# Patient Record
Sex: Female | Born: 1960 | Race: White | Hispanic: No | Marital: Single | Smoking: Former smoker
Health system: Southern US, Community
[De-identification: ages and names within clinical notes are randomized; demographics above are authoritative.]

## PROBLEM LIST (undated history)

## (undated) DIAGNOSIS — G5760 Lesion of plantar nerve, unspecified lower limb: Secondary | ICD-10-CM

## (undated) DIAGNOSIS — Z860100 Personal history of colon polyps, unspecified: Secondary | ICD-10-CM

## (undated) DIAGNOSIS — G43909 Migraine, unspecified, not intractable, without status migrainosus: Secondary | ICD-10-CM

## (undated) DIAGNOSIS — N200 Calculus of kidney: Secondary | ICD-10-CM

## (undated) DIAGNOSIS — I2699 Other pulmonary embolism without acute cor pulmonale: Secondary | ICD-10-CM

## (undated) DIAGNOSIS — E119 Type 2 diabetes mellitus without complications: Secondary | ICD-10-CM

## (undated) DIAGNOSIS — Z86718 Personal history of other venous thrombosis and embolism: Secondary | ICD-10-CM

## (undated) DIAGNOSIS — I429 Cardiomyopathy, unspecified: Secondary | ICD-10-CM

## (undated) DIAGNOSIS — E559 Vitamin D deficiency, unspecified: Secondary | ICD-10-CM

## (undated) DIAGNOSIS — Z8719 Personal history of other diseases of the digestive system: Secondary | ICD-10-CM

## (undated) DIAGNOSIS — I10 Essential (primary) hypertension: Secondary | ICD-10-CM

## (undated) HISTORY — DX: Personal history of colon polyps, unspecified: Z86.0100

## (undated) HISTORY — PX: UPPER GASTROINTESTINAL ENDOSCOPY: SHX188

## (undated) HISTORY — PX: LIPOSUCTION: SHX10

## (undated) HISTORY — DX: Lesion of plantar nerve, unspecified lower limb: G57.60

## (undated) HISTORY — PX: LITHOTRIPSY: SUR834

## (undated) HISTORY — DX: Type 2 diabetes mellitus without complications: E11.9

## (undated) HISTORY — PX: CHOLECYSTECTOMY: SHX55

## (undated) HISTORY — DX: Personal history of other venous thrombosis and embolism: Z86.718

## (undated) HISTORY — DX: Essential (primary) hypertension: I10

## (undated) HISTORY — DX: Personal history of colonic polyps: Z86.010

## (undated) HISTORY — DX: Personal history of other diseases of the digestive system: Z87.19

## (undated) HISTORY — DX: Vitamin D deficiency, unspecified: E55.9

## (undated) HISTORY — PX: COLONOSCOPY: SHX174

## (undated) HISTORY — PX: ABDOMINAL HYSTERECTOMY: SHX81

---
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
## 2008-03-20 ENCOUNTER — Emergency Department (HOSPITAL_COMMUNITY): Admission: EM | Admit: 2008-03-20 | Discharge: 2008-03-20 | Payer: Self-pay | Admitting: Emergency Medicine

## 2008-11-27 ENCOUNTER — Encounter: Admission: RE | Admit: 2008-11-27 | Discharge: 2008-11-27 | Payer: Self-pay | Admitting: Allergy and Immunology

## 2010-03-23 ENCOUNTER — Emergency Department (HOSPITAL_COMMUNITY): Admission: EM | Admit: 2010-03-23 | Discharge: 2010-03-23 | Payer: Self-pay | Admitting: Emergency Medicine

## 2010-03-24 ENCOUNTER — Ambulatory Visit: Payer: Self-pay | Admitting: Vascular Surgery

## 2010-03-24 ENCOUNTER — Encounter (INDEPENDENT_AMBULATORY_CARE_PROVIDER_SITE_OTHER): Payer: Self-pay | Admitting: Emergency Medicine

## 2010-03-24 ENCOUNTER — Ambulatory Visit: Admission: RE | Admit: 2010-03-24 | Discharge: 2010-03-24 | Payer: Self-pay | Admitting: Emergency Medicine

## 2010-11-23 ENCOUNTER — Other Ambulatory Visit (HOSPITAL_COMMUNITY)
Admission: RE | Admit: 2010-11-23 | Discharge: 2010-11-23 | Disposition: A | Discharge status: Home / Self Care | Payer: Self-pay | Source: Ambulatory Visit | Attending: Internal Medicine | Admitting: Internal Medicine

## 2010-11-23 DIAGNOSIS — Z01419 Encounter for gynecological examination (general) (routine) without abnormal findings: Secondary | ICD-10-CM | POA: Insufficient documentation

## 2010-12-28 LAB — COMPREHENSIVE METABOLIC PANEL
ALT: 22 U/L (ref 0–35)
AST: 22 U/L (ref 0–37)
Albumin: 4.2 g/dL (ref 3.5–5.2)
BUN: 10 mg/dL (ref 6–23)
CO2: 29 mEq/L (ref 19–32)
Calcium: 9.1 mg/dL (ref 8.4–10.5)
Chloride: 110 mEq/L (ref 96–112)
GFR calc non Af Amer: >60 mL/min (ref >60)
Glucose, Bld: 99 mg/dL (ref 70–99)
Potassium: 3.8 mEq/L (ref 3.5–5.1)

## 2010-12-28 LAB — CBC
HCT: 39.1 % (ref 36.0–46.0)
MCV: 83.7 fL (ref 78.0–100.0)
Platelets: 277 10*3/uL (ref 150–400)
RBC: 4.68 MIL/uL (ref 3.87–5.11)
RDW: 12.6 % (ref 11.5–15.5)

## 2010-12-28 LAB — DIFFERENTIAL
Basophils Relative: 1 % (ref 0–1)
Eosinophils Absolute: 0.3 10*3/uL (ref 0.0–0.7)
Lymphocytes Relative: 24 % (ref 12–46)
Monocytes Absolute: 0.3 10*3/uL (ref 0.1–1.0)
Neutro Abs: 5.3 10*3/uL (ref 1.7–7.7)

## 2010-12-28 LAB — POCT CARDIAC MARKERS
CKMB, poc: <1 ng/mL — ABNORMAL LOW (ref 1.0–8.0)
Myoglobin, poc: 60 ng/mL (ref 12–200)
Troponin i, poc: <0.05 ng/mL (ref 0.00–0.09)

## 2011-03-01 ENCOUNTER — Other Ambulatory Visit (HOSPITAL_COMMUNITY)
Admission: RE | Admit: 2011-03-01 | Discharge: 2011-03-01 | Disposition: A | Discharge status: Home / Self Care | Payer: 59 | Source: Ambulatory Visit | Attending: Internal Medicine | Admitting: Internal Medicine

## 2011-03-01 DIAGNOSIS — Z01419 Encounter for gynecological examination (general) (routine) without abnormal findings: Secondary | ICD-10-CM | POA: Insufficient documentation

## 2011-12-30 ENCOUNTER — Other Ambulatory Visit: Payer: Self-pay | Admitting: Family Medicine

## 2011-12-30 DIAGNOSIS — R1011 Right upper quadrant pain: Secondary | ICD-10-CM

## 2011-12-31 ENCOUNTER — Other Ambulatory Visit: Payer: Self-pay | Admitting: Family Medicine

## 2011-12-31 DIAGNOSIS — N63 Unspecified lump in unspecified breast: Secondary | ICD-10-CM

## 2012-01-03 ENCOUNTER — Ambulatory Visit
Admission: RE | Admit: 2012-01-03 | Discharge: 2012-01-03 | Disposition: A | Discharge status: Home / Self Care | Payer: Managed Care, Other (non HMO) | Source: Ambulatory Visit | Attending: Family Medicine | Admitting: Family Medicine

## 2012-01-03 DIAGNOSIS — R1011 Right upper quadrant pain: Secondary | ICD-10-CM

## 2012-01-10 ENCOUNTER — Ambulatory Visit
Admission: RE | Admit: 2012-01-10 | Discharge: 2012-01-10 | Disposition: A | Discharge status: Home / Self Care | Payer: Managed Care, Other (non HMO) | Source: Ambulatory Visit | Attending: Family Medicine | Admitting: Family Medicine

## 2012-01-10 DIAGNOSIS — N63 Unspecified lump in unspecified breast: Secondary | ICD-10-CM

## 2012-09-01 ENCOUNTER — Other Ambulatory Visit (HOSPITAL_COMMUNITY): Payer: Self-pay | Admitting: Gastroenterology

## 2012-09-01 DIAGNOSIS — R1011 Right upper quadrant pain: Secondary | ICD-10-CM

## 2012-09-06 ENCOUNTER — Ambulatory Visit (HOSPITAL_COMMUNITY): Payer: Managed Care, Other (non HMO)

## 2012-09-06 ENCOUNTER — Other Ambulatory Visit (HOSPITAL_COMMUNITY): Payer: Self-pay | Admitting: Gastroenterology

## 2012-09-06 ENCOUNTER — Ambulatory Visit (HOSPITAL_COMMUNITY)
Admission: RE | Admit: 2012-09-06 | Discharge: 2012-09-06 | Disposition: A | Discharge status: Home / Self Care | Payer: Managed Care, Other (non HMO) | Source: Ambulatory Visit | Attending: Gastroenterology | Admitting: Gastroenterology

## 2012-09-06 DIAGNOSIS — R1011 Right upper quadrant pain: Secondary | ICD-10-CM

## 2012-09-06 DIAGNOSIS — K5900 Constipation, unspecified: Secondary | ICD-10-CM | POA: Insufficient documentation

## 2012-09-20 ENCOUNTER — Other Ambulatory Visit: Payer: Self-pay | Admitting: Family Medicine

## 2012-09-20 DIAGNOSIS — R109 Unspecified abdominal pain: Secondary | ICD-10-CM

## 2012-09-20 DIAGNOSIS — G8929 Other chronic pain: Secondary | ICD-10-CM

## 2012-09-22 ENCOUNTER — Ambulatory Visit
Admission: RE | Admit: 2012-09-22 | Discharge: 2012-09-22 | Disposition: A | Discharge status: Home / Self Care | Payer: Managed Care, Other (non HMO) | Source: Ambulatory Visit | Attending: Family Medicine | Admitting: Family Medicine

## 2012-09-22 DIAGNOSIS — G8929 Other chronic pain: Secondary | ICD-10-CM

## 2012-11-24 ENCOUNTER — Ambulatory Visit (INDEPENDENT_AMBULATORY_CARE_PROVIDER_SITE_OTHER): Payer: 59 | Admitting: General Surgery

## 2013-02-06 DIAGNOSIS — N200 Calculus of kidney: Secondary | ICD-10-CM | POA: Insufficient documentation

## 2013-02-06 DIAGNOSIS — D72829 Elevated white blood cell count, unspecified: Secondary | ICD-10-CM | POA: Insufficient documentation

## 2013-02-06 DIAGNOSIS — R109 Unspecified abdominal pain: Secondary | ICD-10-CM | POA: Insufficient documentation

## 2013-03-01 ENCOUNTER — Emergency Department (HOSPITAL_COMMUNITY)
Admission: EM | Admit: 2013-03-01 | Discharge: 2013-03-01 | Disposition: A | Discharge status: Home / Self Care | Payer: 59 | Attending: Emergency Medicine | Admitting: Emergency Medicine

## 2013-03-01 ENCOUNTER — Encounter (HOSPITAL_COMMUNITY): Payer: Self-pay | Admitting: *Deleted

## 2013-03-01 ENCOUNTER — Emergency Department (HOSPITAL_COMMUNITY): Payer: 59

## 2013-03-01 DIAGNOSIS — R42 Dizziness and giddiness: Secondary | ICD-10-CM | POA: Insufficient documentation

## 2013-03-01 DIAGNOSIS — R6883 Chills (without fever): Secondary | ICD-10-CM | POA: Insufficient documentation

## 2013-03-01 DIAGNOSIS — R109 Unspecified abdominal pain: Secondary | ICD-10-CM | POA: Insufficient documentation

## 2013-03-01 DIAGNOSIS — N200 Calculus of kidney: Secondary | ICD-10-CM | POA: Insufficient documentation

## 2013-03-01 HISTORY — DX: Calculus of kidney: N20.0

## 2013-03-01 LAB — COMPREHENSIVE METABOLIC PANEL
Alkaline Phosphatase: 66 U/L (ref 39–117)
CO2: 24 mEq/L (ref 19–32)
Chloride: 99 mEq/L (ref 96–112)
Creatinine, Ser: 0.71 mg/dL (ref 0.50–1.10)
GFR calc Af Amer: >90 mL/min (ref >90)
GFR calc non Af Amer: >90 mL/min (ref >90)
Glucose, Bld: 109 mg/dL — ABNORMAL HIGH (ref 70–99)
Sodium: 137 mEq/L (ref 135–145)
Total Bilirubin: 0.6 mg/dL (ref 0.3–1.2)
Total Protein: 8.2 g/dL (ref 6.0–8.3)

## 2013-03-01 LAB — CBC WITH DIFFERENTIAL/PLATELET
Basophils Absolute: 0 10*3/uL (ref 0.0–0.1)
Basophils Relative: 0 % (ref 0–1)
Eosinophils Relative: 2 % (ref 0–5)
Hemoglobin: 13.9 g/dL (ref 12.0–15.0)
Lymphocytes Relative: 17 % (ref 12–46)
MCH: 29.1 pg (ref 26.0–34.0)
MCHC: 34.8 g/dL (ref 30.0–36.0)
MCV: 83.5 fL (ref 78.0–100.0)
Monocytes Absolute: 0.6 10*3/uL (ref 0.1–1.0)
Monocytes Relative: 8 % (ref 3–12)
RBC: 4.78 MIL/uL (ref 3.87–5.11)
RDW: 12.5 % (ref 11.5–15.5)

## 2013-03-01 LAB — URINALYSIS, ROUTINE W REFLEX MICROSCOPIC
Glucose, UA: NEGATIVE mg/dL
Ketones, ur: NEGATIVE mg/dL
Leukocytes, UA: NEGATIVE
Protein, ur: NEGATIVE mg/dL
Specific Gravity, Urine: 1.025 (ref 1.005–1.030)

## 2013-03-01 LAB — URINE MICROSCOPIC-ADD ON

## 2013-03-01 MED ORDER — SODIUM CHLORIDE 0.9 % IV BOLUS (SEPSIS)
500.0000 mL | Freq: Once | INTRAVENOUS | Status: AC
  Administered 2013-03-01: 500 mL via INTRAVENOUS

## 2013-03-01 MED ORDER — MORPHINE SULFATE 4 MG/ML IJ SOLN
4.0000 mg | Freq: Once | INTRAMUSCULAR | Status: AC
  Administered 2013-03-01: 4 mg via INTRAVENOUS
  Filled 2013-03-01: qty 1

## 2013-03-01 MED ORDER — ONDANSETRON HCL 4 MG/2ML IJ SOLN
4.0000 mg | Freq: Once | INTRAMUSCULAR | Status: AC
  Administered 2013-03-01: 4 mg via INTRAVENOUS
  Filled 2013-03-01: qty 2

## 2013-03-01 NOTE — ED Notes (Signed)
Pt c/o upper abd pain, bilateral flank pain w/increase pain to her RUQ radiating into her back and up into her Right shoulder blade and nausea x2 weeks, pt experiencing increase pain associated w/SOB, chills, and dizziness starting this am. Pt denies burning w/urintaion or abnormal vaginal odor/discharge. Pt has a hx of kidney stones, states her symptoms feel the same, pt had a stent placed two weeks ago which was removed one week ago, pt had lithotripsy procedure done as well one week ago.

## 2013-03-01 NOTE — ED Notes (Signed)
Pt has hx of kidney stones.  Had lithotripsy and stent placement x 2 weeks ago.  States that she is having bilateral flank and abdominal pain with nausea today.  Reports it feels the same as her kidney stones.  Denies blood in urine

## 2013-03-01 NOTE — ED Provider Notes (Signed)
History     CSN: [id number]  Arrival date & time 03/01/13  1453   First MD Initiated Contact with Patient 03/01/13 1527      Chief Complaint  Patient presents with  . Nephrolithiasis     Patient is a 52 y.o. female presenting with flank pain. The history is provided by the patient.  Flank Pain This is a recurrent problem. Episode onset: earlier today. The problem occurs constantly. The problem has been gradually worsening. Pertinent negatives include no chest pain, no abdominal pain and no headaches. Exacerbated by: palpation. Nothing relieves the symptoms. She has tried rest for the symptoms. The treatment provided mild relief.  pt reports bilateral flank pain She reports nausea and chills No recorded fever.  No cp. No weakness No abd pain No vag bleeding No dysuria  Pt reports recent ureteral stent placement in [place] about 3 weeks ago, and then had lithotripsy and stent removal last week by her urologist in [facility] She now reports recurrence of her pain   Past Medical History  Diagnosis Date  . Kidney stones     Past Surgical History  Procedure Laterality Date  . Lithotripsy      History reviewed. No pertinent family history.  History  Substance Use Topics  . Smoking status: Never Smoker   . Smokeless tobacco: Not on file  . Alcohol Use: No    OB History   Grav Para Term Preterm Abortions TAB SAB Ect Mult Living                  Review of Systems  Constitutional: Positive for chills. Negative for fever.  HENT: Negative for neck pain.   Cardiovascular: Negative for chest pain.  Gastrointestinal: Negative for abdominal pain.  Genitourinary: Positive for flank pain.  Neurological: Positive for dizziness. Negative for weakness and headaches.  All other systems reviewed and are negative.    Allergies  Contrast media  Home Medications   Current Outpatient Rx  Name  Route  Sig  Dispense  Refill  . aspirin EC 81 MG tablet   Oral   Take 81 mg  by mouth daily.         . cetirizine (ZYRTEC) 10 MG tablet   Oral   Take 10 mg by mouth daily.         . cholecalciferol (VITAMIN D) 1000 UNITS tablet   Oral   Take 2,000 Units by mouth daily.         . furosemide (LASIX) 40 MG tablet   Oral   Take 40 mg by mouth daily.         . metoCLOPramide (REGLAN) 10 MG tablet   Oral   Take 10 mg by mouth 4 (four) times daily.         Marland Kitchen omeprazole (PRILOSEC) 20 MG capsule   Oral   Take 20 mg by mouth daily.         . ondansetron (ZOFRAN) 4 MG tablet   Oral   Take 4 mg by mouth every 8 (eight) hours as needed for nausea.         Marland Kitchen oxyCODONE-acetaminophen (PERCOCET/ROXICET) 5-325 MG per tablet   Oral   Take 1 tablet by mouth every 4 (four) hours as needed for pain.           BP 136/76  Pulse 89  Temp(Src) 98.4 F (36.9 C) (Oral)  Resp 18  SpO2 99%  Physical Exam CONSTITUTIONAL: Well developed/well nourished HEAD: Normocephalic/atraumatic  EYES: EOMI/PERRL ENMT: Mucous membranes moist NECK: supple no meningeal signs SPINE:entire spine nontender, No bruising/crepitance/stepoffs noted to spine CV: S1/S2 noted, no murmurs/rubs/gallops noted LUNGS: Lungs are clear to auscultation bilaterally, no apparent distress ABDOMEN: soft, nontender, no rebound or guarding ZO:[id number] cva tenderness NEURO: Pt is awake/alert, moves all extremitiesx4 EXTREMITIES: pulses normal, full ROM SKIN: warm, color normal PSYCH: no abnormalities of mood noted  ED Course  Procedures Labs Reviewed  URINALYSIS, ROUTINE W REFLEX MICROSCOPIC - Abnormal; Notable for the following:    Hgb urine dipstick SMALL (*)    All other components within normal limits  URINE MICROSCOPIC-ADD ON - Abnormal; Notable for the following:    Squamous Epithelial / LPF MANY (*)    All other components within normal limits  CBC WITH DIFFERENTIAL  COMPREHENSIVE METABOLIC PANEL   9:81 PM On my assessment pt reports only flank pain, no RUQ tenderness or  upper back tenderness reported (nurse documented RUQ pain) She reports similar to prior kidney stone She did report dizziness but no syncope - EKG unremarkable She has had multiple Ct scans recently.  Will start with renal US to evaluate for hydro then reassess 6:33 PM Pt improved No hydronephrosis Labs are reassuring Discussed need for f/u with her urologist Pt agreeable  MDM  Nursing notes including past medical history and social history reviewed and considered in documentation Labs/vital reviewed and considered        Date: 03/01/2013  Rate: 92  Rhythm: normal sinus rhythm  QRS Axis: normal  Intervals: normal  ST/T Wave abnormalities: normal  Conduction Disutrbances:none  Narrative Interpretation:   Old EKG Reviewed: unchanged    Joya Gaskins, MD 03/01/13 [phone]

## 2013-03-01 NOTE — ED Notes (Signed)
Pt dc'd home w/all belongings, alert and ambulatory upon dc, no new rx prescribed, pt verbalizes understanding of dc instructions, driven home by daughter

## 2013-05-28 ENCOUNTER — Emergency Department (HOSPITAL_COMMUNITY)
Admission: EM | Admit: 2013-05-28 | Discharge: 2013-05-28 | Disposition: A | Discharge status: Home / Self Care | Payer: Worker's Compensation | Attending: Emergency Medicine | Admitting: Emergency Medicine

## 2013-05-28 ENCOUNTER — Encounter (HOSPITAL_COMMUNITY): Payer: Self-pay | Admitting: *Deleted

## 2013-05-28 ENCOUNTER — Emergency Department (HOSPITAL_COMMUNITY): Payer: Worker's Compensation

## 2013-05-28 DIAGNOSIS — R0789 Other chest pain: Secondary | ICD-10-CM

## 2013-05-28 DIAGNOSIS — Z86711 Personal history of pulmonary embolism: Secondary | ICD-10-CM | POA: Insufficient documentation

## 2013-05-28 DIAGNOSIS — R0602 Shortness of breath: Secondary | ICD-10-CM | POA: Insufficient documentation

## 2013-05-28 DIAGNOSIS — R071 Chest pain on breathing: Secondary | ICD-10-CM | POA: Insufficient documentation

## 2013-05-28 DIAGNOSIS — Z79899 Other long term (current) drug therapy: Secondary | ICD-10-CM | POA: Insufficient documentation

## 2013-05-28 DIAGNOSIS — Z87442 Personal history of urinary calculi: Secondary | ICD-10-CM | POA: Insufficient documentation

## 2013-05-28 HISTORY — DX: Other pulmonary embolism without acute cor pulmonale: I26.99

## 2013-05-28 MED ORDER — ONDANSETRON HCL 4 MG/2ML IJ SOLN: 4.0000 mg | Freq: Once | INTRAMUSCULAR | Status: AC

## 2013-05-28 MED ORDER — MORPHINE SULFATE 4 MG/ML IJ SOLN
4.0000 mg | Freq: Once | INTRAMUSCULAR | Status: AC
  Administered 2013-05-28: 4 mg via INTRAVENOUS
  Filled 2013-05-28: qty 1

## 2013-05-28 MED ORDER — OXYCODONE-ACETAMINOPHEN 5-325 MG PO TABS: 1.0000 | ORAL_TABLET | Freq: Four times a day (QID) | ORAL | Status: DC | PRN

## 2013-05-28 MED ORDER — ONDANSETRON HCL 4 MG/2ML IJ SOLN
INTRAMUSCULAR | Status: AC
  Administered 2013-05-28: 4 mg via INTRAVENOUS
  Filled 2013-05-28: qty 2

## 2013-05-28 NOTE — ED Notes (Signed)
Patient transported to X-ray 

## 2013-05-28 NOTE — ED Provider Notes (Signed)
CSN: [id number]     Arrival date & time 05/28/13  1229 History     First MD Initiated Contact with Patient 05/28/13 1246     Chief Complaint  Patient presents with  . Chest Pain  . Shortness of Breath   (Consider location/radiation/quality/duration/timing/severity/associated sxs/prior Treatment) Patient is a 52 y.o. female presenting with chest pain and shortness of breath. The history is provided by the patient.  Chest Pain Associated symptoms: shortness of breath   Associated symptoms: no abdominal pain, no back pain, no headache, no nausea, no numbness, not vomiting and no weakness   Shortness of Breath Associated symptoms: chest pain   Associated symptoms: no abdominal pain, no headaches, no rash and no vomiting    patient presents with sharp left-sided chest pain. She states while she was lifting a grill at work she felt a pop and had pain. It is worse with movement worse with deep breathing. No hemoptysis. No cough. No swelling or legs. She did have a previous history of pulmonary embolism, however she is not on anticoagulation. She does not smoke.  Past Medical History  Diagnosis Date  . Kidney stones   . Pulmonary embolism    Past Surgical History  Procedure Laterality Date  . Lithotripsy     No family history on file. History  Substance Use Topics  . Smoking status: Never Smoker   . Smokeless tobacco: Not on file  . Alcohol Use: No   OB History   Grav Para Term Preterm Abortions TAB SAB Ect Mult Living                 Review of Systems  Constitutional: Negative for activity change and appetite change.  HENT: Negative for neck stiffness.   Eyes: Negative for pain.  Respiratory: Positive for shortness of breath. Negative for chest tightness.   Cardiovascular: Positive for chest pain. Negative for leg swelling.  Gastrointestinal: Negative for nausea, vomiting, abdominal pain and diarrhea.  Genitourinary: Negative for flank pain.  Musculoskeletal: Negative for  back pain.  Skin: Negative for rash.  Neurological: Negative for weakness, numbness and headaches.  Psychiatric/Behavioral: Negative for behavioral problems.    Allergies  Contrast media  Home Medications   Current Outpatient Rx  Name  Route  Sig  Dispense  Refill  . furosemide (LASIX) 40 MG tablet   Oral   Take 40 mg by mouth daily.         Marland Kitchen ibuprofen (ADVIL,MOTRIN) 200 MG tablet   Oral   Take 800 mg by mouth every 6 (six) hours as needed for pain (back pain).         Marland Kitchen omeprazole (PRILOSEC) 20 MG capsule   Oral   Take 20 mg by mouth daily.         . ondansetron (ZOFRAN) 4 MG tablet   Oral   Take 4 mg by mouth every 8 (eight) hours as needed for nausea.         Marland Kitchen oxyCODONE-acetaminophen (PERCOCET/ROXICET) 5-325 MG per tablet   Oral   Take 1 tablet by mouth every 4 (four) hours as needed for pain.         Marland Kitchen oxyCODONE-acetaminophen (PERCOCET/ROXICET) 5-325 MG per tablet   Oral   Take 1-2 tablets by mouth every 6 (six) hours as needed for pain.   15 tablet   0    BP 126/67  Pulse 80  Temp(Src) 98.2 F (36.8 C) (Oral)  Resp 15  SpO2 98% Physical Exam  Nursing note and vitals reviewed. Constitutional: She is oriented to person, place, and time. She appears well-developed and well-nourished.  HENT:  Head: Normocephalic and atraumatic.  Eyes: EOM are normal. Pupils are equal, round, and reactive to light.  Neck: Normal range of motion. Neck supple.  Cardiovascular: Normal rate, regular rhythm and normal heart sounds.   No murmur heard. Pulmonary/Chest: Effort normal and breath sounds normal. No respiratory distress. She has no wheezes. She has no rales. She exhibits tenderness.  Tenderness to left anterior chest wall just lateral to sternum. No crepitance deformity. No subcutaneous emphysema.  Abdominal: Soft. Bowel sounds are normal. She exhibits no distension. There is no tenderness. There is no rebound and no guarding.  Musculoskeletal: Normal range  of motion.  Neurological: She is alert and oriented to person, place, and time. No cranial nerve deficit.  Skin: Skin is warm and dry.  Psychiatric: She has a normal mood and affect. Her speech is normal.    ED Course   Procedures (including critical care time)  Labs Reviewed - No data to display Dg Chest 2 View  05/28/2013   *RADIOLOGY REPORT*  Clinical Data: Chest pain shortness of breath.  CHEST - 2 VIEW  Comparison: None.  Findings: Low volume film without focal airspace consolidation, pulmonary edema, or pleural effusion. The cardiopericardial silhouette is within normal limits for size. Imaged bony structures of the thorax are intact.  IMPRESSION: No acute cardiopulmonary process.   Original Report Authenticated By: Kennith Center, M.D.   Dg Ribs Unilateral Left  05/28/2013   *RADIOLOGY REPORT*  Clinical Data: Chest pain with rib pain near left axilla.  LEFT RIBS - 2 VIEW  Comparison: No comparison studies available.  Findings: Two views of the left ribs obtained with a radiopaque BB localizing the region of patient concern.  No underlying displaced acute left rib fracture is evident.  IMPRESSION: No evidence for displaced acute left rib fracture.   Original Report Authenticated By: Kennith Center, M.D.   1. Chest wall pain     Date: 05/28/2013  Rate: 90  Rhythm: normal sinus rhythm  QRS Axis: normal  Intervals: normal  ST/T Wave abnormalities: normal  Conduction Disutrbances: none  Narrative Interpretation: unremarkable    MDM  Patient with reproducible left-sided chest pain. Began acutely while lifting something. Likely chest wall pain. X-rays negative for fracture. Doubt cardiac cause or pulmonary embolism. Will discharge home.  Juliet Rude. Rubin Payor, MD 05/28/13 [phone]

## 2013-05-28 NOTE — ED Notes (Signed)
Pt was moving a grill at work and heard a pop in her left breast, then had pain shoot to left arm and gave her numbness in left arm.  Pt has pain with moving left arm.  Reports sob and has history of throwing a blood clot post cath with same feeling.

## 2013-06-02 ENCOUNTER — Emergency Department (HOSPITAL_COMMUNITY)
Admission: EM | Admit: 2013-06-02 | Discharge: 2013-06-02 | Disposition: A | Discharge status: Home / Self Care | Payer: Worker's Compensation | Attending: Emergency Medicine | Admitting: Emergency Medicine

## 2013-06-02 ENCOUNTER — Encounter (HOSPITAL_COMMUNITY): Payer: Self-pay | Admitting: Emergency Medicine

## 2013-06-02 ENCOUNTER — Emergency Department (HOSPITAL_COMMUNITY): Payer: Worker's Compensation

## 2013-06-02 DIAGNOSIS — R0789 Other chest pain: Secondary | ICD-10-CM

## 2013-06-02 DIAGNOSIS — Z79899 Other long term (current) drug therapy: Secondary | ICD-10-CM | POA: Insufficient documentation

## 2013-06-02 DIAGNOSIS — R071 Chest pain on breathing: Secondary | ICD-10-CM | POA: Insufficient documentation

## 2013-06-02 DIAGNOSIS — Z87442 Personal history of urinary calculi: Secondary | ICD-10-CM | POA: Insufficient documentation

## 2013-06-02 DIAGNOSIS — Z86711 Personal history of pulmonary embolism: Secondary | ICD-10-CM | POA: Insufficient documentation

## 2013-06-02 MED ORDER — OXYCODONE-ACETAMINOPHEN 7.5-325 MG PO TABS: 1.0000 | ORAL_TABLET | ORAL | Status: DC | PRN

## 2013-06-02 MED ORDER — METHOCARBAMOL 500 MG PO TABS: 500.0000 mg | ORAL_TABLET | Freq: Two times a day (BID) | ORAL | Status: DC

## 2013-06-02 MED ORDER — IBUPROFEN 600 MG PO TABS: 600.0000 mg | ORAL_TABLET | Freq: Four times a day (QID) | ORAL | Status: DC | PRN

## 2013-06-02 NOTE — ED Provider Notes (Signed)
CSN: [id number]     Arrival date & time 06/02/13  1837 History     First MD Initiated Contact with Patient 06/02/13 1848     Chief Complaint  Patient presents with  . Chest Pain   (Consider location/radiation/quality/duration/timing/severity/associated sxs/prior Treatment) Patient is a 52 y.o. female presenting with chest pain. The history is provided by the patient.  Chest Pain  patient here complaining of left-sided chest pain x5 days after she was moving a growth. Seen here and had negative x-ray according to the old records. There is no signs of rib fracture pneumothorax. Continues to have sharp pain is worse with movement. Has been using Percocet without relief. She denies any recent current injury. No fever or cough. No anginal type chest pain. No medications used prior to arrival  Past Medical History  Diagnosis Date  . Kidney stones   . Pulmonary embolism    Past Surgical History  Procedure Laterality Date  . Lithotripsy    . Cholecystectomy    . Abdominal hysterectomy     No family history on file. History  Substance Use Topics  . Smoking status: Never Smoker   . Smokeless tobacco: Not on file  . Alcohol Use: Yes   OB History   Grav Para Term Preterm Abortions TAB SAB Ect Mult Living                 Review of Systems  Cardiovascular: Positive for chest pain.  All other systems reviewed and are negative.    Allergies  Contrast media  Home Medications   Current Outpatient Rx  Name  Route  Sig  Dispense  Refill  . furosemide (LASIX) 40 MG tablet   Oral   Take 40 mg by mouth daily.         Marland Kitchen ibuprofen (ADVIL,MOTRIN) 200 MG tablet   Oral   Take 800 mg by mouth every 6 (six) hours as needed for pain (back pain).         Marland Kitchen omeprazole (PRILOSEC) 20 MG capsule   Oral   Take 20 mg by mouth daily.         . ondansetron (ZOFRAN) 4 MG tablet   Oral   Take 4 mg by mouth every 8 (eight) hours as needed for nausea.         Marland Kitchen oxyCODONE-acetaminophen  (PERCOCET/ROXICET) 5-325 MG per tablet   Oral   Take 1 tablet by mouth every 4 (four) hours as needed for pain.         Marland Kitchen oxyCODONE-acetaminophen (PERCOCET/ROXICET) 5-325 MG per tablet   Oral   Take 1-2 tablets by mouth every 6 (six) hours as needed for pain.   15 tablet   0    BP 128/72  Pulse 96  Temp(Src) 98.1 F (36.7 C)  Resp 18  SpO2 97% Physical Exam  Nursing note and vitals reviewed. Constitutional: She is oriented to person, place, and time. She appears well-developed and well-nourished.  Non-toxic appearance. No distress.  HENT:  Head: Normocephalic and atraumatic.  Eyes: Conjunctivae, EOM and lids are normal. Pupils are equal, round, and reactive to light.  Neck: Normal range of motion. Neck supple. No tracheal deviation present. No mass present.  Cardiovascular: Normal rate, regular rhythm and normal heart sounds.  Exam reveals no gallop.   No murmur heard. Pulmonary/Chest: Effort normal and breath sounds normal. No stridor. No respiratory distress. She has no decreased breath sounds. She has no wheezes. She has no rhonchi. She has  no rales.    Abdominal: Soft. Normal appearance and bowel sounds are normal. She exhibits no distension. There is no tenderness. There is no rebound and no CVA tenderness.  Musculoskeletal: Normal range of motion. She exhibits no edema and no tenderness.  Neurological: She is alert and oriented to person, place, and time. She has normal strength. No cranial nerve deficit or sensory deficit. GCS eye subscore is 4. GCS verbal subscore is 5. GCS motor subscore is 6.  Skin: Skin is warm and dry. No abrasion and no rash noted.  Psychiatric: She has a normal mood and affect. Her speech is normal and behavior is normal.    ED Course   Procedures (including critical care time)  Labs Reviewed - No data to display No results found. No diagnosis found.  MDM     Rate: 98   Rhythm: normal sinus rhythm  QRS Axis: normal  Intervals: normal   ST/T Wave abnormalities: normal  Conduction Disutrbances:none  Narrative Interpretation:   Old EKG Reviewed: none available  Chest x-rays pending of the likely to be negative. Anticipate discharge  Toy Baker, MD 06/02/13 [phone]

## 2013-06-02 NOTE — ED Notes (Signed)
Onset 5 days ago moved a grill heard a pop in chest seen in ED x-ray negative given pain medication. States pain continued with chest pain and increased shortness of breath.  Pain currently 7/10 achy sore.  Airway intact bilateral equal chest rise and fall.

## 2013-06-02 NOTE — ED Provider Notes (Signed)
Chest x-ray, reviewed.  Patient informed of the new finding of bilateral lower lobe, atelectasis.  She's been encouraged to take deep breaths.  A regular basis throughout the day  Arman Filter, NP 06/02/13 2113

## 2013-06-03 NOTE — ED Provider Notes (Signed)
Medical screening examination/treatment/procedure(s) were performed by non-physician practitioner and as supervising physician I was immediately available for consultation/collaboration.  Jazzalynn Rhudy T Arnetta Odeh, MD 06/03/13 1756 

## 2013-12-10 ENCOUNTER — Ambulatory Visit
Admission: RE | Admit: 2013-12-10 | Discharge: 2013-12-10 | Disposition: A | Discharge status: Home / Self Care | Payer: 59 | Source: Ambulatory Visit | Attending: Family Medicine | Admitting: Family Medicine

## 2013-12-10 ENCOUNTER — Other Ambulatory Visit: Payer: Self-pay | Admitting: Family Medicine

## 2013-12-10 DIAGNOSIS — R0989 Other specified symptoms and signs involving the circulatory and respiratory systems: Secondary | ICD-10-CM

## 2013-12-10 DIAGNOSIS — R059 Cough, unspecified: Secondary | ICD-10-CM

## 2013-12-10 DIAGNOSIS — R05 Cough: Secondary | ICD-10-CM

## 2014-04-10 HISTORY — PX: CARDIAC SURGERY: SHX584

## 2014-09-25 ENCOUNTER — Emergency Department (HOSPITAL_COMMUNITY)
Admission: EM | Admit: 2014-09-25 | Discharge: 2014-09-25 | Disposition: A | Discharge status: Home / Self Care | Payer: 59 | Attending: Emergency Medicine | Admitting: Emergency Medicine

## 2014-09-25 ENCOUNTER — Emergency Department (HOSPITAL_COMMUNITY): Payer: 59

## 2014-09-25 ENCOUNTER — Encounter (HOSPITAL_COMMUNITY): Payer: Self-pay | Admitting: Emergency Medicine

## 2014-09-25 DIAGNOSIS — Z79899 Other long term (current) drug therapy: Secondary | ICD-10-CM | POA: Diagnosis not present

## 2014-09-25 DIAGNOSIS — G43909 Migraine, unspecified, not intractable, without status migrainosus: Secondary | ICD-10-CM

## 2014-09-25 DIAGNOSIS — R519 Headache, unspecified: Secondary | ICD-10-CM

## 2014-09-25 DIAGNOSIS — Z86711 Personal history of pulmonary embolism: Secondary | ICD-10-CM | POA: Diagnosis not present

## 2014-09-25 DIAGNOSIS — R51 Headache: Secondary | ICD-10-CM | POA: Diagnosis present

## 2014-09-25 DIAGNOSIS — Z87442 Personal history of urinary calculi: Secondary | ICD-10-CM | POA: Insufficient documentation

## 2014-09-25 DIAGNOSIS — Z8679 Personal history of other diseases of the circulatory system: Secondary | ICD-10-CM | POA: Diagnosis not present

## 2014-09-25 HISTORY — DX: Migraine, unspecified, not intractable, without status migrainosus: G43.909

## 2014-09-25 HISTORY — DX: Cardiomyopathy, unspecified: I42.9

## 2014-09-25 LAB — BASIC METABOLIC PANEL
ANION GAP: 13 (ref 5–15)
BUN: 15 mg/dL (ref 6–23)
CHLORIDE: 100 meq/L (ref 96–112)
CO2: 27 meq/L (ref 19–32)
Calcium: 9.6 mg/dL (ref 8.4–10.5)
Creatinine, Ser: 0.66 mg/dL (ref 0.50–1.10)
GFR calc non Af Amer: >90 mL/min (ref >90)
Glucose, Bld: 97 mg/dL (ref 70–99)
POTASSIUM: 3.9 meq/L (ref 3.7–5.3)
Sodium: 140 mEq/L (ref 137–147)

## 2014-09-25 LAB — CBC WITH DIFFERENTIAL/PLATELET
BASOS ABS: 0 10*3/uL (ref 0.0–0.1)
BASOS PCT: 0 % (ref 0–1)
Eosinophils Absolute: 0.2 10*3/uL (ref 0.0–0.7)
Eosinophils Relative: 2 % (ref 0–5)
HCT: 42 % (ref 36.0–46.0)
HEMOGLOBIN: 14.6 g/dL (ref 12.0–15.0)
LYMPHS PCT: 18 % (ref 12–46)
Lymphs Abs: 1.8 10*3/uL (ref 0.7–4.0)
MCH: 28.9 pg (ref 26.0–34.0)
MCHC: 34.8 g/dL (ref 30.0–36.0)
MCV: 83 fL (ref 78.0–100.0)
MONOS PCT: 5 % (ref 3–12)
Monocytes Absolute: 0.5 10*3/uL (ref 0.1–1.0)
NEUTROS ABS: 7.8 10*3/uL — AB (ref 1.7–7.7)
NEUTROS PCT: 75 % (ref 43–77)
Platelets: 240 10*3/uL (ref 150–400)
RBC: 5.06 MIL/uL (ref 3.87–5.11)
RDW: 12.8 % (ref 11.5–15.5)
WBC: 10.3 10*3/uL (ref 4.0–10.5)

## 2014-09-25 MED ORDER — SODIUM CHLORIDE 0.9 % IV BOLUS (SEPSIS)
1000.0000 mL | Freq: Once | INTRAVENOUS | Status: AC
  Administered 2014-09-25: 1000 mL via INTRAVENOUS

## 2014-09-25 MED ORDER — METOCLOPRAMIDE HCL 5 MG/ML IJ SOLN
10.0000 mg | Freq: Once | INTRAMUSCULAR | Status: AC
  Administered 2014-09-25: 10 mg via INTRAVENOUS
  Filled 2014-09-25: qty 2

## 2014-09-25 MED ORDER — KETOROLAC TROMETHAMINE 30 MG/ML IJ SOLN
30.0000 mg | Freq: Once | INTRAMUSCULAR | Status: AC
  Administered 2014-09-25: 30 mg via INTRAVENOUS
  Filled 2014-09-25: qty 1

## 2014-09-25 MED ORDER — DIPHENHYDRAMINE HCL 50 MG/ML IJ SOLN
25.0000 mg | Freq: Once | INTRAMUSCULAR | Status: AC
  Administered 2014-09-25: 25 mg via INTRAVENOUS
  Filled 2014-09-25: qty 1

## 2014-09-25 NOTE — ED Notes (Signed)
Pt c/o left sided HA into neck x 12 days; pt with nausea and photophobia; pt sent here for further eval and hx of migraine in past

## 2014-09-25 NOTE — ED Provider Notes (Signed)
CSN: [id number]     Arrival date & time 09/25/14  1304 History   First MD Initiated Contact with Patient 09/25/14 1613     Chief Complaint  Patient presents with  . Headache     (Consider location/radiation/quality/duration/timing/severity/associated sxs/prior Treatment) HPI Comments: Patient presents today with a left sided headache.  She reports that the headache has been present since 09/14/14.  She reports that the pain has been fairly constant, but at times does improve temporarily.  She has been taking Excedrin Migraine without relief.  She reports that the headache is similar to previous headaches, but worse.  She was seen by her PCP just prior to arrival and sent to the ED to obtain a CT scan of her head.  She reports associated photosensitivity, nausea, and dizziness.  She denies vomiting, weakness, fever, chills, vision changes, neck pain/stiffness, numbness, or tingling.  She states that she [facility] in [facility] past, but not in the past 6 years.  Patient is a 53 y.o. female presenting with headaches. The history is provided by the patient.  Headache   Past Medical History  Diagnosis Date  . Kidney stones   . Pulmonary embolism   . Migraine   . Cardiomyopathy    Past Surgical History  Procedure Laterality Date  . Lithotripsy    . Cholecystectomy    . Abdominal hysterectomy     History reviewed. No pertinent family history. History  Substance Use Topics  . Smoking status: Never Smoker   . Smokeless tobacco: Not on file  . Alcohol Use: Yes   OB History    No data available     Review of Systems  Neurological: Positive for headaches.  All other systems reviewed and are negative.     Allergies  Contrast media and Fish allergy  Home Medications   Prior to Admission medications   Medication Sig Start Date End Date Taking? Authorizing Provider  furosemide (LASIX) 40 MG tablet Take 40 mg by mouth daily.    Historical Provider,  MD  ibuprofen (ADVIL,MOTRIN) 600 MG tablet Take 1 tablet (600 mg total) by mouth every 6 (six) hours as needed for pain. 06/02/13   Leota Jacobsen, MD  methocarbamol (ROBAXIN) 500 MG tablet Take 1 tablet (500 mg total) by mouth 2 (two) times daily. 06/02/13   Leota Jacobsen, MD  omeprazole (PRILOSEC) 20 MG capsule Take 20 mg by mouth daily.    Historical Provider, MD  ondansetron (ZOFRAN) 4 MG tablet Take 4 mg by mouth every 8 (eight) hours as needed for nausea.    Historical Provider, MD  oxyCODONE-acetaminophen (PERCOCET) 7.5-325 MG per tablet Take 1 tablet by mouth every 4 (four) hours as needed for pain. 06/02/13   Leota Jacobsen, MD  oxyCODONE-acetaminophen (PERCOCET/ROXICET) 5-325 MG per tablet Take 1 tablet by mouth every 6 (six) hours as needed for pain.    Historical Provider, MD   BP 134/74 mmHg  Pulse 80  Temp(Src) 97.9 F (36.6 C) (Oral)  Resp 16  Ht 5\' 5"  (1.651 m)  Wt 250 lb (113.399 kg)  BMI 41.60 kg/m2  SpO2 99% Physical Exam  Constitutional: She appears well-developed and well-nourished.  HENT:  Head: Normocephalic and atraumatic.  Mouth/Throat: Oropharynx is clear and moist.  Eyes: EOM are normal. Pupils are equal, round, and reactive to light.  Neck: Normal range of motion. Neck supple.  Cardiovascular: Normal rate, regular rhythm and normal heart sounds.   Pulmonary/Chest: Effort  normal and breath sounds normal.  Abdominal: Soft. Bowel sounds are normal.  Musculoskeletal: Normal range of motion.  Neurological: She is alert. She has normal strength. No cranial nerve deficit or sensory deficit. Coordination and gait normal.  Normal finger to nose testing Normal gait, no ataxia  Normal rapid alternating movements   Skin: Skin is warm and dry.  Psychiatric: She has a normal mood and affect.  Nursing note and vitals reviewed.   ED Course  Procedures (including critical care time) Labs Review Labs Reviewed  CBC WITH DIFFERENTIAL - Abnormal; Notable for the  following:    Neutro Abs 7.8 (*)    All other components within normal limits  BASIC METABOLIC PANEL    Imaging Review Ct Head Wo Contrast  09/25/2014   CLINICAL DATA:  Headaches x5 days  EXAM: CT HEAD WITHOUT CONTRAST  TECHNIQUE: Contiguous axial images were obtained from the base of the skull through the vertex without intravenous contrast.  COMPARISON:  03/20/2008  FINDINGS: No evidence of parenchymal hemorrhage or extra-axial fluid collection. No mass lesion, mass effect, or midline shift.  No CT evidence of acute infarction.  Mild small vessel ischemic changes.  Cerebral volume is within normal limits.  No ventriculomegaly.  The visualized paranasal sinuses are essentially clear. The mastoid air cells are unopacified.  No evidence of calvarial fracture.  IMPRESSION: No evidence of acute intracranial abnormality.  Mild small vessel ischemic changes.   Electronically Signed   By: Julian Hy M.D.   On: 09/25/2014 17:37     EKG Interpretation None     6:00 PM Patient reports improvement of headache at this time. MDM   Final diagnoses:  Headache   Pt HA treated and improved while in ED.  Presentation is like pts typical HA and non concerning for [facility]: Lambert Rhonda W, ICH, Meningitis, or temporal arteritis. Patient sent to the ED by PCP to obtain Head CT.  Head CT is negative for acute intracranial abnormality.  Pt is afebrile with no focal neuro deficits, nuchal rigidity, or change in vision. Pt given referral to Neurology outpatient.  Patient stable for discharge.  Pt verbalizes understanding and is agreeable with plan to dc. Return precautions given.      Hyman Bible, PA-C 09/25/14 [place], MD 09/25/14 [phone]

## 2014-10-02 ENCOUNTER — Encounter: Payer: Self-pay | Admitting: Diagnostic Neuroimaging

## 2014-10-02 ENCOUNTER — Ambulatory Visit (INDEPENDENT_AMBULATORY_CARE_PROVIDER_SITE_OTHER): Payer: 59 | Admitting: Diagnostic Neuroimaging

## 2014-10-02 VITALS — BP 134/80 | HR 95 | Temp 97.8°F | Ht 65.5 in | Wt 248.0 lb

## 2014-10-02 DIAGNOSIS — G43111 Migraine with aura, intractable, with status migrainosus: Secondary | ICD-10-CM

## 2014-10-02 MED ORDER — PREDNISONE 10 MG PO TABS: ORAL_TABLET | ORAL | Status: DC

## 2014-10-02 MED ORDER — SUMATRIPTAN SUCCINATE 100 MG PO TABS: 100.0000 mg | ORAL_TABLET | Freq: Once | ORAL | Status: DC | PRN

## 2014-10-02 MED ORDER — AMITRIPTYLINE HCL 25 MG PO TABS: 25.0000 mg | ORAL_TABLET | Freq: Every day | ORAL | Status: DC

## 2014-10-02 NOTE — Progress Notes (Signed)
[facility]  PATIENT: Ashley Mcconnell DOB: 01/07/1961  REFERRING CLINICIAN: ER HISTORY FROM: Patient REASON FOR VISIT: Follow-up   HISTORICAL  CHIEF COMPLAINT:  Chief Complaint  Patient presents with  . Headache    HISTORY OF PRESENT ILLNESS:   53 year old right handed female with cardiomyopathy, kidney stones, here for evaluation of migraine.  Patient has had headaches since teenage years. She describes left greater than right-sided, throbbing, severe pain with muscle tension in her neck and back of head, with nausea, photophobia and phonophobia. Headaches can last 2-3 days a time up to 7 days at a time. Triggers include stress in her life. Sometimes she sees spots before headaches start.  2009 patient had an episode of severe headache with a "pop" sensation in the right side of her head followed by right arm weakness and inability to talk. Apparently patient was evaluated in the emergency room and diagnosed with complicated migraine. Patient followed up with headache/wellness center and received "shots" in head to help headaches.   Over past 2 months headaches have significantly worsened. Patient has had constant headaches since 09/14/2014. She is tried ibuprofen and Excedrin Migraine without relief.   REVIEW OF SYSTEMS: Full 14 system review of systems performed and notable only for weight gain fatigue blurred vision eye pain hearing loss trouble swallowing aching muscles decreased energy passing out feeling faint sleepiness difficulty swallowing dizziness numbness weakness headache confusion memory loss.  ALLERGIES: Allergies  Allergen Reactions  . Contrast Media [Iodinated Diagnostic Agents]     Rash   . Fish Allergy Rash    Ashley Mcconnell is only fish she does not have allergy to    HOME MEDICATIONS: Outpatient Prescriptions Prior to Visit  Medication Sig Dispense Refill  . omeprazole (PRILOSEC) 20 MG capsule Take 20 mg by mouth daily.    . furosemide  (LASIX) 40 MG tablet Take 40 mg by mouth daily.    Marland Kitchen ibuprofen (ADVIL,MOTRIN) 600 MG tablet Take 1 tablet (600 mg total) by mouth every 6 (six) hours as needed for pain. 30 tablet 0  . methocarbamol (ROBAXIN) 500 MG tablet Take 1 tablet (500 mg total) by mouth 2 (two) times daily. 20 tablet 0  . ondansetron (ZOFRAN) 4 MG tablet Take 4 mg by mouth every 8 (eight) hours as needed for nausea.    Marland Kitchen oxyCODONE-acetaminophen (PERCOCET) 7.5-325 MG per tablet Take 1 tablet by mouth every 4 (four) hours as needed for pain. 12 tablet 0  . oxyCODONE-acetaminophen (PERCOCET/ROXICET) 5-325 MG per tablet Take 1 tablet by mouth every 6 (six) hours as needed for pain.     No facility-administered medications prior to visit.    PAST MEDICAL HISTORY: Past Medical History  Diagnosis Date  . Kidney stones   . Pulmonary embolism   . Migraine   . Cardiomyopathy   . Morton's neuroma     L Foot    PAST SURGICAL HISTORY: Past Surgical History  Procedure Laterality Date  . Lithotripsy      x5  . Cholecystectomy    . Abdominal hysterectomy    . Tonsillectomy  1969  . Cardiac surgery  04/2014    Catherization     FAMILY HISTORY: Family History  Problem Relation Age of Onset  . Breast cancer Mother   . Stroke Father   . Heart Problems Father     heart bypass  . Alzheimer's disease Paternal Aunt   . Alzheimer's disease Paternal Uncle   . Heart Problems Paternal Grandmother  enlarged heart  . Alzheimer's disease Paternal Aunt   . Alzheimer's disease Paternal Aunt     SOCIAL HISTORY:  History   Social History  . Marital Status: Single    Spouse Name: N/A    Number of Children: 3  . Years of Education: College   Occupational History  .  Other    [employer].    Social History Main Topics  . Smoking status: Former Smoker    Types: Cigarettes  . Smokeless tobacco: Never Used     Comment: social smoker  . Alcohol Use: 0.0 oz/week    0 Not specified per week     Comment: 1  drink on New Year's Eve  . Drug Use: No  . Sexual Activity: Not on file   Other Topics Concern  . Not on file   Social History Narrative   Patient lives at home with her family.   Caffeine Use: 1 cup of tea two times weekly     PHYSICAL EXAM  Filed Vitals:   10/02/14 1417  BP: 134/80  Pulse: 95  Temp: 97.8 F (36.6 C)  TempSrc: Oral  Height: 5' 5.5" (1.664 m)  Weight: 248 lb (112.492 kg)    Body mass index is 40.63 kg/(m^2).   Visual Acuity Screening   Right eye Left eye Both eyes  Without correction:     With correction: 20/30 20/30     No flowsheet data found.  GENERAL EXAM: Patient is in no distress; well developed, nourished and groomed; neck is supple; IN DARKENED ROOM, MILD DISTRESS APPEARANCE  CARDIOVASCULAR: Regular rate and rhythm, no murmurs, no carotid bruits  NEUROLOGIC: MENTAL STATUS: awake, alert, oriented to person, place and time, recent and remote memory intact, normal attention and concentration, language fluent, comprehension intact, naming intact, fund of knowledge appropriate CRANIAL NERVE: no papilledema on fundoscopic exam, pupils equal and reactive to light, visual fields full to confrontation, extraocular muscles intact, no nystagmus, facial sensation and strength symmetric, hearing intact, palate elevates symmetrically, uvula midline, shoulder shrug symmetric, tongue midline. MOTOR: normal bulk and tone, full strength in the BUE, BLE SENSORY: normal and symmetric to light touch, pinprick, temperature, vibration  COORDINATION: finger-nose-finger, fine finger movements normal REFLEXES: deep tendon reflexes present and symmetric GAIT/STATION: narrow based gait; able to tandem; romberg is negative    DIAGNOSTIC DATA (LABS, IMAGING, TESTING) - I reviewed patient records, labs, notes, testing and imaging myself where available.  Lab Results  Component Value Date   WBC 10.3 09/25/2014   HGB 14.6 09/25/2014   HCT 42.0 09/25/2014   MCV  83.0 09/25/2014   PLT 240 09/25/2014      Component Value Date/Time   NA 140 09/25/2014 1631   K 3.9 09/25/2014 1631   CL 100 09/25/2014 1631   CO2 27 09/25/2014 1631   GLUCOSE 97 09/25/2014 1631   BUN 15 09/25/2014 1631   CREATININE 0.66 09/25/2014 1631   CALCIUM 9.6 09/25/2014 1631   PROT 8.2 03/01/2013 1534   ALBUMIN 4.2 03/01/2013 1534   AST 18 03/01/2013 1534   ALT 19 03/01/2013 1534   ALKPHOS 66 03/01/2013 1534   BILITOT 0.6 03/01/2013 1534   GFRNONAA >90 09/25/2014 1631   GFRAA >90 09/25/2014 1631   No results found for: CHOL, HDL, LDLCALC, LDLDIRECT, TRIG, CHOLHDL No results found for: HGBA1C No results found for: VITAMINB12 No results found for: TSH  I reviewed images myself and agree with interpretation. -VRP  09/25/14 CT head - No  evidence of acute intracranial abnormality. Mild small vessel ischemic changes.    ASSESSMENT AND PLAN  53 y.o. year old female here with migraine with aura since teenage years, worsening in the last 3 months. Neuro exam unremarkable. Will start migraine treatments.  PLAN: - prednisone dose pack - amitriptyline 25mg  qhs - sumatriptan prn  Meds ordered this encounter  Medications  . predniSONE (DELTASONE) 10 MG tablet    Sig: Take 60mg  on day 1. Reduce by 10mg  each subsequent day. (60, 50, 40, 30, 20, 10, stop)    Dispense:  21 tablet    Refill:  0  . amitriptyline (ELAVIL) 25 MG tablet    Sig: Take 1 tablet (25 mg total) by mouth at bedtime.    Dispense:  30 tablet    Refill:  6  . SUMAtriptan (IMITREX) 100 MG tablet    Sig: Take 1 tablet (100 mg total) by mouth once as needed for migraine. May repeat x 1 after 2 hours; maximum 2 tabs per day and 8 tabs per month    Dispense:  8 tablet    Refill:  6   Return in about 1 month (around 11/02/2014).    Penni Bombard, MD 67/67/2094, 7:09 PM Certified in Neurology, Neurophysiology and Neuroimaging  [facility] [address] [place], [place] [address] [phone]

## 2014-10-02 NOTE — Patient Instructions (Signed)
Start prednisone dose pack on 10/03/14 (take in the morning).  Start amitriptyline 25mg  at bedtime tonight (10/02/14).  Use sumatriptan as needed for breakthrough migraine.  Use ibuprofen or excedrin as needed for headache.

## 2014-10-31 ENCOUNTER — Ambulatory Visit (INDEPENDENT_AMBULATORY_CARE_PROVIDER_SITE_OTHER): Payer: 59 | Admitting: Diagnostic Neuroimaging

## 2014-10-31 ENCOUNTER — Encounter: Payer: Self-pay | Admitting: Diagnostic Neuroimaging

## 2014-10-31 VITALS — BP 120/80 | HR 88 | Temp 98.2°F | Ht 65.0 in | Wt 248.2 lb

## 2014-10-31 DIAGNOSIS — G43109 Migraine with aura, not intractable, without status migrainosus: Secondary | ICD-10-CM

## 2014-10-31 NOTE — Progress Notes (Signed)
[facility]  PATIENT: Ashley Mcconnell DOB: Jun 19, 1961  REFERRING CLINICIAN: ER HISTORY FROM: Patient REASON FOR VISIT: Follow-up   HISTORICAL  CHIEF COMPLAINT:  Chief Complaint  Patient presents with  . Follow-up    intractable migrane with aura status migraniosus;     HISTORY OF PRESENT ILLNESS:   UPDATE 10/31/14: Doing better. Amitriptyline seems to be helping, but can only tolerate it every other day.   PRIOR HPI (10/02/14): 55 year old right handed female with cardiomyopathy, kidney stones, here for evaluation of migraine. Patient has had headaches since teenage years. She describes left greater than right-sided, throbbing, severe pain with muscle tension in her neck and back of head, with nausea, photophobia and phonophobia. Headaches can last 2-3 days a time up to 7 days at a time. Triggers include stress in her life. Sometimes she sees spots before headaches start. 2009 patient had an episode of severe headache with a "pop" sensation in the right side of her head followed by right arm weakness and inability to talk. Apparently patient was evaluated in the emergency room and diagnosed with complicated migraine. Patient followed up with headache/wellness center and received "shots" in head to help headaches. Over past 2 months headaches have significantly worsened. Patient has had constant headaches since 09/14/2014. She is tried ibuprofen and Excedrin Migraine without relief.   REVIEW OF SYSTEMS: Full 14 system review of systems performed and notable only for weight gain fatigue blurred vision eye pain hearing loss trouble swallowing aching muscles decreased energy passing out feeling faint sleepiness difficulty swallowing dizziness numbness weakness headache confusion memory loss.  ALLERGIES: Allergies  Allergen Reactions  . Contrast Media [Iodinated Diagnostic Agents]     Rash   . Fish Allergy Rash    [name] is only fish she does not have allergy to     HOME MEDICATIONS: Outpatient Prescriptions Prior to Visit  Medication Sig Dispense Refill  . amitriptyline (ELAVIL) 25 MG tablet Take 1 tablet (25 mg total) by mouth at bedtime. 30 tablet 6  . omeprazole (PRILOSEC) 20 MG capsule Take 20 mg by mouth daily.    . SUMAtriptan (IMITREX) 100 MG tablet Take 1 tablet (100 mg total) by mouth once as needed for migraine. May repeat x 1 after 2 hours; maximum 2 tabs per day and 8 tabs per month (Patient not taking: Reported on 10/31/2014) 8 tablet 6  . aspirin-acetaminophen-caffeine (EXCEDRIN MIGRAINE) 295-188-41 MG per tablet Take 1 tablet by mouth every 6 (six) hours as needed for headache.    . ibuprofen (ADVIL,MOTRIN) 400 MG tablet Take 400 mg by mouth 2 (two) times daily.    . predniSONE (DELTASONE) 10 MG tablet Take 60mg  on day 1. Reduce by 10mg  each subsequent day. (60, 50, 40, 30, 20, 10, stop) 21 tablet 0   No facility-administered medications prior to visit.    PAST MEDICAL HISTORY: Past Medical History  Diagnosis Date  . Kidney stones   . Pulmonary embolism   . Migraine   . Cardiomyopathy   . Morton's neuroma     L Foot    PAST SURGICAL HISTORY: Past Surgical History  Procedure Laterality Date  . Lithotripsy      x5  . Cholecystectomy    . Abdominal hysterectomy    . Tonsillectomy  1969  . Cardiac surgery  04/2014    Catherization     FAMILY HISTORY: Family History  Problem Relation Age of Onset  . Breast cancer Mother   . Stroke Father   .  Heart Problems Father     heart bypass  . Alzheimer's disease Paternal Aunt   . Alzheimer's disease Paternal Uncle   . Heart Problems Paternal Grandmother     enlarged heart  . Alzheimer's disease Paternal Aunt   . Alzheimer's disease Paternal Aunt     SOCIAL HISTORY:  History   Social History  . Marital Status: Single    Spouse Name: N/A    Number of Children: 3  . Years of Education: College   Occupational History  .  Other    [employer].    Social  History Main Topics  . Smoking status: Former Smoker    Types: Cigarettes  . Smokeless tobacco: Never Used     Comment: social smoker  . Alcohol Use: 0.0 oz/week    0 Not specified per week     Comment: 1 drink on New Year's Eve  . Drug Use: No  . Sexual Activity: Not on file   Other Topics Concern  . Not on file   Social History Narrative   Patient lives at home with her family.   Caffeine Use: 1 cup of tea two times weekly     PHYSICAL EXAM  Filed Vitals:   10/31/14 1518  BP: 120/80  Pulse: 88  Temp: 98.2 F (36.8 C)  TempSrc: Oral  Height: 5\' 5"  (1.651 m)  Weight: 248 lb 3.2 oz (112.583 kg)    Body mass index is 41.3 kg/(m^2).  No exam data present  No flowsheet data found.  GENERAL EXAM: Patient is in no distress; well developed, nourished and groomed; neck is supple  CARDIOVASCULAR: Regular rate and rhythm, no murmurs, no carotid bruits  NEUROLOGIC: MENTAL STATUS: awake, alert, language fluent, comprehension intact, naming intact, fund of knowledge appropriate CRANIAL NERVE: no papilledema on fundoscopic exam, pupils equal and reactive to light, visual fields full to confrontation, extraocular muscles intact, no nystagmus, facial sensation and strength symmetric, hearing intact, palate elevates symmetrically, uvula midline, shoulder shrug symmetric, tongue midline. MOTOR: normal bulk and tone, full strength in the BUE, BLE SENSORY: normal and symmetric to light touch COORDINATION: finger-nose-finger, fine finger movements normal REFLEXES: deep tendon reflexes present and symmetric GAIT/STATION: narrow based gait; romberg is negative    DIAGNOSTIC DATA (LABS, IMAGING, TESTING) - I reviewed patient records, labs, notes, testing and imaging myself where available.  Lab Results  Component Value Date   WBC 10.3 09/25/2014   HGB 14.6 09/25/2014   HCT 42.0 09/25/2014   MCV 83.0 09/25/2014   PLT 240 09/25/2014      Component Value Date/Time   NA 140  09/25/2014 1631   K 3.9 09/25/2014 1631   CL 100 09/25/2014 1631   CO2 27 09/25/2014 1631   GLUCOSE 97 09/25/2014 1631   BUN 15 09/25/2014 1631   CREATININE 0.66 09/25/2014 1631   CALCIUM 9.6 09/25/2014 1631   PROT 8.2 03/01/2013 1534   ALBUMIN 4.2 03/01/2013 1534   AST 18 03/01/2013 1534   ALT 19 03/01/2013 1534   ALKPHOS 66 03/01/2013 1534   BILITOT 0.6 03/01/2013 1534   GFRNONAA >90 09/25/2014 1631   GFRAA >90 09/25/2014 1631   No results found for: CHOL, HDL, LDLCALC, LDLDIRECT, TRIG, CHOLHDL No results found for: HGBA1C No results found for: VITAMINB12 No results found for: TSH  I reviewed images myself and agree with interpretation. -VRP  09/25/14 CT head - No evidence of acute intracranial abnormality. Mild small vessel ischemic changes.    ASSESSMENT AND  PLAN  54 y.o. year old female here with migraine with aura since teenage years, worsening in the last 3 months. Neuro exam unremarkable. Have not tried topiramate due to history of kidney stones. Doing slightly better this visit with amitriptyline.   PLAN: - continue amitriptyline 25mg  qhs + sumatriptan prn  Return in about 3 months (around 01/30/2015).    Penni Bombard, MD 02/26/8415, 6:06 PM Certified in Neurology, Neurophysiology and Neuroimaging  [facility] [address] [place], [place] [address] [phone]

## 2014-10-31 NOTE — Patient Instructions (Signed)
Continue current medications. 

## 2014-12-31 ENCOUNTER — Telehealth: Payer: Self-pay | Admitting: *Deleted

## 2014-12-31 NOTE — Telephone Encounter (Signed)
Spoke with the pt on the phone and got her appt rescheduled

## 2015-01-06 ENCOUNTER — Encounter: Payer: Self-pay | Admitting: Diagnostic Neuroimaging

## 2015-01-06 ENCOUNTER — Ambulatory Visit (INDEPENDENT_AMBULATORY_CARE_PROVIDER_SITE_OTHER): Payer: 59 | Admitting: Diagnostic Neuroimaging

## 2015-01-06 VITALS — BP 130/82 | HR 70 | Ht 65.0 in | Wt 240.0 lb

## 2015-01-06 DIAGNOSIS — G43109 Migraine with aura, not intractable, without status migrainosus: Secondary | ICD-10-CM | POA: Diagnosis not present

## 2015-01-06 MED ORDER — AMITRIPTYLINE HCL 25 MG PO TABS: 25.0000 mg | ORAL_TABLET | Freq: Every day | ORAL | Status: DC

## 2015-01-06 NOTE — Progress Notes (Signed)
[facility]  PATIENT: Ashley Mcconnell DOB: April 21, 1961  REFERRING CLINICIAN:  HISTORY FROM: Patient REASON FOR VISIT: Follow-up   HISTORICAL  CHIEF COMPLAINT:  Chief Complaint  Patient presents with  . Follow-up    migraines    HISTORY OF PRESENT ILLNESS:   UPDATE 01/06/15: Since last visit, doing about the same. Sunlight and stress are triggers. Having HA 3x per week. Using amitrip 25mg  qod. Cannot tolerate sumatriptan. Doing better with exercise (5x per week) and eating (< 1000 calories per day; meals per day). Struggling with sleep (wakes up every hour). Sleeps with TV on all night, volume off.  UPDATE 10/31/14: Doing better. Amitriptyline seems to be helping, but can only tolerate it every other day.   PRIOR HPI (10/02/14): 54 year old right handed female with cardiomyopathy, kidney stones, here for evaluation of migraine. Patient has had headaches since teenage years. She describes left greater than right-sided, throbbing, severe pain with muscle tension in her neck and back of head, with nausea, photophobia and phonophobia. Headaches can last 2-3 days a time up to 7 days at a time. Triggers include stress in her life. Sometimes she sees spots before headaches start. 2009 patient had an episode of severe headache with a "pop" sensation in the right side of her head followed by right arm weakness and inability to talk. Apparently patient was evaluated in the emergency room and diagnosed with complicated migraine. Patient followed up with headache/wellness center and received "shots" in head to help headaches. Over past 2 months headaches have significantly worsened. Patient has had constant headaches since 09/14/2014. She is tried ibuprofen and Excedrin Migraine without relief.   REVIEW OF SYSTEMS: Full 14 system review of systems performed and notable only for eye redness light sens hearing loss aching muscles neck stiffness cough dizziness headache memory loss.     ALLERGIES: Allergies  Allergen Reactions  . Contrast Media [Iodinated Diagnostic Agents]     Rash   . Fish Allergy Rash    [name] is only fish she does not have allergy to    HOME MEDICATIONS: Outpatient Prescriptions Prior to Visit  Medication Sig Dispense Refill  . omeprazole (PRILOSEC) 20 MG capsule Take 20 mg by mouth daily.    Marland Kitchen amitriptyline (ELAVIL) 25 MG tablet Take 1 tablet (25 mg total) by mouth at bedtime. 30 tablet 6  . SUMAtriptan (IMITREX) 100 MG tablet Take 1 tablet (100 mg total) by mouth once as needed for migraine. May repeat x 1 after 2 hours; maximum 2 tabs per day and 8 tabs per month (Patient not taking: Reported on 10/31/2014) 8 tablet 6   No facility-administered medications prior to visit.    PAST MEDICAL HISTORY: Past Medical History  Diagnosis Date  . Kidney stones   . Pulmonary embolism   . Migraine   . Cardiomyopathy   . Morton's neuroma     L Foot    PAST SURGICAL HISTORY: Past Surgical History  Procedure Laterality Date  . Lithotripsy      x5  . Cholecystectomy    . Abdominal hysterectomy    . Tonsillectomy  1969  . Cardiac surgery  04/2014    Catherization     FAMILY HISTORY: Family History  Problem Relation Age of Onset  . Breast cancer Mother   . Stroke Father   . Heart Problems Father     heart bypass  . Alzheimer's disease Paternal Aunt   . Alzheimer's disease Paternal Uncle   . Heart Problems  Paternal Grandmother     enlarged heart  . Alzheimer's disease Paternal Aunt   . Alzheimer's disease Paternal Aunt     SOCIAL HISTORY:  History   Social History  . Marital Status: Single    Spouse Name: N/A  . Number of Children: 3  . Years of Education: College   Occupational History  .  Other    [employer].    Social History Main Topics  . Smoking status: Former Smoker    Types: Cigarettes  . Smokeless tobacco: Never Used     Comment: social smoker  . Alcohol Use: 0.0 oz/week    0 Standard drinks or  equivalent per week     Comment: 1 drink on New Year's Eve  . Drug Use: No  . Sexual Activity: Not on file   Other Topics Concern  . Not on file   Social History Narrative   Patient lives at home with her family.   Caffeine Use: 1 cup of tea two times weekly     PHYSICAL EXAM  Filed Vitals:   01/06/15 1526  BP: 130/82  Pulse: 70  Height: 5\' 5"  (1.651 m)  Weight: 240 lb (108.863 kg)    Body mass index is 39.94 kg/(m^2).  No exam data present  No flowsheet data found.  GENERAL EXAM: Patient is in no distress; well developed, nourished and groomed; neck is supple; SITTING IN DARKENED ROOM  CARDIOVASCULAR: Regular rate and rhythm, no murmurs, no carotid bruits  NEUROLOGIC: MENTAL STATUS: awake, alert, language fluent, comprehension intact, naming intact, fund of knowledge appropriate CRANIAL NERVE: no papilledema on fundoscopic exam, pupils equal and reactive to light, visual fields full to confrontation, extraocular muscles intact, no nystagmus, facial sensation and strength symmetric, hearing intact, palate elevates symmetrically, uvula midline, shoulder shrug symmetric, tongue midline. MOTOR: normal bulk and tone, full strength in the BUE, BLE SENSORY: normal and symmetric to light touch COORDINATION: finger-nose-finger, fine finger movements normal REFLEXES: deep tendon reflexes present and symmetric GAIT/STATION: narrow based gait; romberg is negative    DIAGNOSTIC DATA (LABS, IMAGING, TESTING) - I reviewed patient records, labs, notes, testing and imaging myself where available.  Lab Results  Component Value Date   WBC 10.3 09/25/2014   HGB 14.6 09/25/2014   HCT 42.0 09/25/2014   MCV 83.0 09/25/2014   PLT 240 09/25/2014      Component Value Date/Time   NA 140 09/25/2014 1631   K 3.9 09/25/2014 1631   CL 100 09/25/2014 1631   CO2 27 09/25/2014 1631   GLUCOSE 97 09/25/2014 1631   BUN 15 09/25/2014 1631   CREATININE 0.66 09/25/2014 1631   CALCIUM 9.6  09/25/2014 1631   PROT 8.2 03/01/2013 1534   ALBUMIN 4.2 03/01/2013 1534   AST 18 03/01/2013 1534   ALT 19 03/01/2013 1534   ALKPHOS 66 03/01/2013 1534   BILITOT 0.6 03/01/2013 1534   GFRNONAA >90 09/25/2014 1631   GFRAA >90 09/25/2014 1631   No results found for: CHOL, HDL, LDLCALC, LDLDIRECT, TRIG, CHOLHDL No results found for: HGBA1C No results found for: VITAMINB12 No results found for: TSH  I reviewed images myself and agree with interpretation. -VRP  09/25/14 CT head - No evidence of acute intracranial abnormality. Mild small vessel ischemic changes.    ASSESSMENT AND PLAN  54 y.o. year old female here with migraine with aura since teenage years, worsening in the last 3 months. Neuro exam unremarkable. Have not tried topiramate due to history of kidney  stones. Doing slightly better on amitriptyline.   PLAN: - continue amitriptyline 25mg  qhs - qod - sleep hygeine strategies reviewed  Return in about 6 months (around 07/09/2015).  I spent 15 minutes of face to face time with patient. Greater than 50% of time was spent in counseling and coordination of care with patient.     Penni Bombard, MD 1/61/0960, 4:54 PM Certified in Neurology, Neurophysiology and Neuroimaging  [facility] [address], [place], [place] [address] [phone]

## 2015-01-06 NOTE — Patient Instructions (Signed)
Continue amitriptyline

## 2015-01-20 ENCOUNTER — Other Ambulatory Visit: Payer: Self-pay | Admitting: Family Medicine

## 2015-01-20 ENCOUNTER — Ambulatory Visit
Admission: RE | Admit: 2015-01-20 | Discharge: 2015-01-20 | Disposition: A | Discharge status: Home / Self Care | Payer: 59 | Source: Ambulatory Visit | Attending: Family Medicine | Admitting: Family Medicine

## 2015-01-20 DIAGNOSIS — R059 Cough, unspecified: Secondary | ICD-10-CM

## 2015-01-20 DIAGNOSIS — R05 Cough: Secondary | ICD-10-CM

## 2015-01-20 DIAGNOSIS — R042 Hemoptysis: Secondary | ICD-10-CM

## 2015-02-03 ENCOUNTER — Ambulatory Visit: Payer: 59 | Admitting: Diagnostic Neuroimaging

## 2015-02-04 ENCOUNTER — Other Ambulatory Visit: Payer: Self-pay | Admitting: Family Medicine

## 2015-02-04 DIAGNOSIS — N644 Mastodynia: Secondary | ICD-10-CM

## 2015-02-04 DIAGNOSIS — N63 Unspecified lump in unspecified breast: Secondary | ICD-10-CM

## 2015-02-10 ENCOUNTER — Other Ambulatory Visit: Payer: Self-pay | Admitting: Family Medicine

## 2015-02-10 DIAGNOSIS — N63 Unspecified lump in unspecified breast: Secondary | ICD-10-CM

## 2015-02-10 DIAGNOSIS — N644 Mastodynia: Secondary | ICD-10-CM

## 2015-02-12 ENCOUNTER — Ambulatory Visit
Admission: RE | Admit: 2015-02-12 | Discharge: 2015-02-12 | Disposition: A | Discharge status: Home / Self Care | Payer: 59 | Source: Ambulatory Visit | Attending: Family Medicine | Admitting: Family Medicine

## 2015-02-12 DIAGNOSIS — N644 Mastodynia: Secondary | ICD-10-CM

## 2015-02-12 DIAGNOSIS — N63 Unspecified lump in unspecified breast: Secondary | ICD-10-CM

## 2015-07-11 ENCOUNTER — Ambulatory Visit
Admission: RE | Admit: 2015-07-11 | Discharge: 2015-07-11 | Disposition: A | Discharge status: Home / Self Care | Payer: 59 | Source: Ambulatory Visit | Attending: Family | Admitting: Family

## 2015-07-11 ENCOUNTER — Other Ambulatory Visit: Payer: Self-pay | Admitting: Family

## 2015-07-11 DIAGNOSIS — R05 Cough: Secondary | ICD-10-CM

## 2015-07-11 DIAGNOSIS — R059 Cough, unspecified: Secondary | ICD-10-CM

## 2015-07-11 DIAGNOSIS — R0600 Dyspnea, unspecified: Secondary | ICD-10-CM

## 2015-07-25 ENCOUNTER — Ambulatory Visit (INDEPENDENT_AMBULATORY_CARE_PROVIDER_SITE_OTHER): Payer: 59 | Admitting: Pulmonary Disease

## 2015-07-25 ENCOUNTER — Encounter: Payer: Self-pay | Admitting: Pulmonary Disease

## 2015-07-25 VITALS — BP 136/86 | HR 82 | Temp 98.4°F | Ht 65.0 in | Wt 246.0 lb

## 2015-07-25 DIAGNOSIS — R0600 Dyspnea, unspecified: Secondary | ICD-10-CM | POA: Diagnosis not present

## 2015-07-25 DIAGNOSIS — R0789 Other chest pain: Secondary | ICD-10-CM | POA: Insufficient documentation

## 2015-07-25 DIAGNOSIS — K219 Gastro-esophageal reflux disease without esophagitis: Secondary | ICD-10-CM

## 2015-07-25 DIAGNOSIS — J387 Other diseases of larynx: Secondary | ICD-10-CM

## 2015-07-25 DIAGNOSIS — J986 Disorders of diaphragm: Secondary | ICD-10-CM | POA: Insufficient documentation

## 2015-07-25 DIAGNOSIS — E669 Obesity, unspecified: Secondary | ICD-10-CM | POA: Insufficient documentation

## 2015-07-25 NOTE — Patient Instructions (Signed)
Jamyah-- it was nice meeting you today...  Today we checked a breathing test and an oxygen saturation test... We will arrange for an outpt "sniff test" to check your left diaphragm which is elevated on your recent CXRs...    We will contact you w/ the results when available...   In the meanwhile>>     1) we need for you to get on track w/ diet & exercise, and get your weight down!!!     2) use the INCENTIVE SPIROMETER device frequently as a lung exerciser to get good deep breaths and expand your lungs...     3) we need to get you on a good ANTIREFLUX REGIMEN>>       Take the [facility] 20-40mg  about 30 min before the evening meal...       Do not eat or drink much after dinner in the eve (it lets your stomach empty out completely before bedtime)       Elevate the head of your bed about 6" on blocks as we discussed...       You may take Pepcid or Zantac w/ a sip of water about 1H before bedtime if necessary...  Call for any questions...  Let's plan a follow up visit in 4-6 weeks, sooner if needed for problems.Marland KitchenMarland Kitchen

## 2015-07-26 ENCOUNTER — Encounter: Payer: Self-pay | Admitting: Pulmonary Disease

## 2015-07-26 NOTE — Progress Notes (Addendum)
Subjective:     Patient ID: Ashley Mcconnell, female   DOB: October 16, 1960, 54 y.o.   MRN: [id number]  HPI      54 y/o WF, referred by Ashley Mcconnell at [facility]- for a pulmonary evaluation>  Karinne complains of a several month hx of a shortness of breath sensation- literally feeliing like she is taking short breaths, can't take a deep breath, and not getting enough air "IN"- esp in her right lung she says; she notes that her right side doesn't inflate normally; she notes this w/ activities like walking fast, carrying loads, or climbing stairs; it seems to be helped by slowing down & taking slow deep breaths; she used to do zoomba 4d/wk but last did this 5/16 & she started getting winded/ decreased capacity/ etc... Around that time she had considerable stress in her life when her daughter's fiance was killed & duaghter & 2 grand children live w/ her now...  Ashley Mcconnell is essentially a never smoker & denies any prev hx of lung diseases; she used to note SOB in cold weather & w/ climbing stairs to a 2nd floor apt but was never diagnosed w/ asthma etc; she's had some left lower CP & LUQ pain & tenderness believed to be CWP; she did have one bout of walking pneumonia 5-6 yrs ago and occas episodes of bronchitis but always improved w/ antibiotics etc; she denies any chr lung dis, never had Tb or exposure, & no known occupational exposures...        Her PMH is pos for HBP and she thinks some sort of "cardiomyopathy- a thickening" diagnosed in [place] in the past, but she had a cath that she says was neg;  She is obese weighing 246# in a 5'5" frame for a BMI= 41; she is too sedentary but is trying to walk more; she denies snoring or known sleep disordered breathing; states she wakes feeling OK most days, but gets tired in the afternoons but rarely naps;  She has some heartburn/ reflux and takes Prilosec in the AM, notes occais cough at night, Ashley Mcconnell is her GI & he plans an EGD soon she says;  She also has a hx of  migraine HAs and sees Ashley Mcconnell for this- on Elavil...       EXAM shows Afeb, VSS, wt=246#, BMI=41;  HEENT- neg, mallampati2;  Chest- decr BS at left base w/o w/r/r;  Heart- RR w/o m/r/g;  Abd- obese, panniculus, sl tender left side & costal margin;  Ext- neg w/o c/c/e...  CXR 07/11/15 in Epic/PACS showed norm heart size, elevated left hemidiaph, min basilar atx, sl peribronch thiclkening at the bases;  Old CXRs back to 2014 w/ similar left hemidiaph changes...   Spirometry 07/25/15 showed FVC=1.96 (58%), FEV1=1.65 (60%), %1sec=84, mid-flows are min reduced at 76% predicted... 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  Ambulatory oxygen saturation test 07/25/15>  O2sat=97% on RA at rest w/ pulse=70/min;  She ambulated 3 laps in the office w/ lowest O2saqt=90% w/ pulse=115/min...  Chest Fluoro "sniff test" to check left hemidiaph status> done 07/29/15 & showed normal diaph function- mild elev left hemidiaph w/ normal movement on insp & sniff test (no paradoxical movement).  IMP >>       Dyspnea, cough, CWP> he resp symptoms are somewhat vague, mild, & appear long standing- most likely multifactorial due to restriction, elev left hemidiaph, LPR, obesity, deconditioning, etc...      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      GERD/ LPR> followed by Ashley Mcconnell, [name] for GI; we reviewed a vigorous antireflux regimen while his work up is being completed...      Morbid Obesity> she denies snoring or sleep disordered breathing but has some somnolence & was prev referred for sleep eval in 2014 but never had it; this may need to be ruled out...      Hx migraine headaches> followed by Ashley Mcconnell on Elavil...       Anxiety state>  This may account for a lot of the recent SOB, can't get a deep breath symptoms; we will consider adding Klonopin later if needed...  PLAN >>       Ashley Mcconnell has obesity & an elev left hemidiaph contributing to pulmonary restriction- we discussed the need for DIET/ EXERCISE PROGRAM/ and WEIGHT REDUCTION; she will also practice w/ an INCENTIVE SPIROMETER device...  She also appears to have GERD/ LPR and is seeing [facility] for GI- we will institute a vigorous antireflux regimen w/ Prilosec 30 min before dinner, NPO after dinner, elev HOB on 6" blocks...  She also has chronic CWP & dyspnea partially related to this & "chest wall musc spasm"- rec to use heating pad & we will consider adding Klonopin if needed; we plan ROV in 4-6 weeks.    Past Medical History  Diagnosis Date  . Kidney stones   . Pulmonary embolism ([place])   . Migraine   . Cardiomyopathy ([place])   . Morton's neuroma     L Foot    Past Surgical History  Procedure Laterality Date  . Lithotripsy      x5  . Cholecystectomy    . Abdominal hysterectomy    . Tonsillectomy  1969  . Cardiac surgery  04/2014    Catherization     Outpatient Encounter Prescriptions as of 07/25/2015  Medication Sig  . amitriptyline (ELAVIL) 25 MG tablet Take 1 tablet (25 mg total) by mouth at bedtime.  Marland Kitchen omeprazole (PRILOSEC) 20 MG capsule Take 20 mg by mouth daily.  . ondansetron (ZOFRAN) 4 MG tablet    No facility-administered encounter medications on file as of 07/25/2015.    Allergies  Allergen Reactions  . Contrast Media [Iodinated Diagnostic Agents]     Rash   . Fish Allergy Rash    [name] is only fish she does not have allergy to    Family History  Problem Relation Age of Onset  . Breast cancer Mother   . Stroke Father   . Heart Problems Father     heart bypass  . Alzheimer's disease Paternal Aunt   . Alzheimer's disease Paternal Uncle   . Heart Problems Paternal Grandmother     enlarged heart  . Alzheimer's  disease Paternal Aunt   . Alzheimer's disease Paternal Aunt     Social History   Social History  . Marital Status: Single    Spouse Name: N/A  . Number of Children: 3  . Years of Education: College   Occupational History  .  Other    [employer].    Social History Main Topics  . Smoking status: Former Smoker -- 0.20 packs/day for 2 years  Types: Cigarettes    Quit date: 07/24/1986  . Smokeless tobacco: Never Used     Comment: social smoker  . Alcohol Use: 0.0 oz/week    0 Standard drinks or equivalent per week     Comment: 1 drink on New Year's Eve  . Drug Use: No  . Sexual Activity: Not on file   Other Topics Concern  . Not on file   Social History Narrative   Patient lives at home with her family.   Caffeine Use: 1 cup of tea two times weekly    Current Medications, Allergies, Past Medical History, Past Surgical History, Family History, and Social History were reviewed in [employer] record.   Review of Systems             All symptoms NEG except where BOLDED >>  Constitutional:  F/C/S, fatigue, anorexia, unexpected weight change. HEENT:  HA, visual changes, hearing loss, earache, nasal symptoms, sore throat, mouth sores, hoarseness. Resp:  cough, sputum, hemoptysis; SOB, tightness, wheezing. Cardio:  CP, palpit, DOE, orthopnea, edema. GI:  N/V/D/C, blood in stool; reflux, abd pain, distention, gas. GU:  dysuria, freq, urgency, hematuria, flank pain, voiding difficulty. MS:  joint pain, swelling, tenderness, decr ROM; neck pain, back pain, etc. Neuro:  HA, tremors, seizures, dizziness, syncope, weakness, numbness, gait abn. Skin:  suspicious lesions or skin rash. Heme:  adenopathy, bruising, bleeding. Psyche:  confusion, agitation, sleep disturbance, hallucinations, anxiety, depression suicidal.   Objective:   Physical Exam       Vital Signs:  Reviewed...  General:  WD, obese, 54 y/o WF in NAD; alert & oriented; pleasant  & cooperative... HEENT:  [place]/AT; Conjunctiva- pink, Sclera- nonicteric, EOM-wnl, PERRLA, EACs-clear, TMs-wnl; NOSE-clear; THROAT-clear & wnl. Neck:  Supple w/ fair ROM; no JVD; normal carotid impulses w/o bruits; no thyromegaly or nodules palpated; no lymphadenopathy. Chest:  Clear to P & A w/ decr BS left base; without wheezes, rales, or rhonchi heard. Heart:  Regular Rhythm; norm S1 & S2 without murmurs, rubs, or gallops detected. Abdomen:  Soft & nontender, panniculus- sl tender left side, no guarding or rebound; normal bowel sounds; no organomegaly or masses palpated. Ext:  Normal ROM; without deformities or arthritic changes; no varicose veins, venous insuffic, or edema;  Pulses intact w/o bruits. Neuro:  CNs II-XII intact; motor testing normal; sensory testing normal; gait normal & balance OK. Derm:  No lesions noted; no rash etc. Lymph:  No cervical, supraclavicular, axillary, or inguinal adenopathy palpated...   Assessment:      IMP >>       Dyspnea, cough, CWP> he resp symptoms are somewhat vague, mild, & appear long standing- most likely multifactorial due to restriction, elev left hemidiaph, LPR, obesity, deconditioning, etc...      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      GERD/ LPR> followed by [place] Mccon[facility] for GI; we reviewed a vigorous antireflux regimen while his work up is being completed...      Morbid Obesity> she denies snoring or sleep disordered breathing but has some somnolence & was prev referred for sleep eval in 2014 but never had it; this may need to be ruled out.Marland KitchenMarland Kitchen  Hx migraine headaches> followed by Ashley Mcconnell on Elavil...      Anxiety state>  This may account for a lot of the recent SOB, can't  get a deep breath symptoms; we will consider adding Klonopin later if needed...  PLAN >>       Rosamaria has obesity & an elev left hemidiaph contributing to pulmonary restriction- we discussed the need for DIET/ EXERCISE PROGRAM/ and WEIGHT REDUCTION; she will also practice w/ an INCENTIVE SPIROMETER device...  She also appears to have GERD/ LPR and is seeing [facility] Mcconnell for GI- we will institute a vigorous antireflux regimen w/ Prilosec 30 min before dinner, NPO after dinner, elev HOB on 6" blocks...  She also has chronic CWP & dyspnea partially related to this & "chest wall musc spasm"- rec to use heating pad & we will consider adding Klonopin if needed; we plan ROV in 4-6 weeks.     Plan:     Patient's Medications  New Prescriptions   No medications on file  Previous Medications   AMITRIPTYLINE (ELAVIL) 25 MG TABLET    Take 1 tablet (25 mg total) by mouth at bedtime.   OMEPRAZOLE (PRILOSEC) 20 MG CAPSULE    Take 20 mg by mouth daily.   ONDANSETRON (ZOFRAN) 4 MG TABLET      Modified Medications   No medications on file  Discontinued Medications   No medications on file

## 2015-07-29 ENCOUNTER — Ambulatory Visit (HOSPITAL_COMMUNITY)
Admission: RE | Admit: 2015-07-29 | Discharge: 2015-07-29 | Disposition: A | Discharge status: Home / Self Care | Payer: 59 | Source: Ambulatory Visit | Attending: Pulmonary Disease | Admitting: Pulmonary Disease

## 2015-07-29 DIAGNOSIS — J986 Disorders of diaphragm: Secondary | ICD-10-CM | POA: Diagnosis not present

## 2015-07-29 DIAGNOSIS — R0602 Shortness of breath: Secondary | ICD-10-CM | POA: Diagnosis not present

## 2015-08-19 ENCOUNTER — Ambulatory Visit: Payer: 59 | Admitting: Pulmonary Disease

## 2015-08-19 VITALS — BP 124/80 | HR 87 | Temp 98.4°F | Wt 245.2 lb

## 2015-08-19 DIAGNOSIS — K219 Gastro-esophageal reflux disease without esophagitis: Secondary | ICD-10-CM

## 2015-08-19 DIAGNOSIS — R0600 Dyspnea, unspecified: Secondary | ICD-10-CM

## 2015-08-19 DIAGNOSIS — J986 Disorders of diaphragm: Secondary | ICD-10-CM

## 2015-08-19 DIAGNOSIS — J387 Other diseases of larynx: Secondary | ICD-10-CM | POA: Diagnosis not present

## 2015-08-19 DIAGNOSIS — E669 Obesity, unspecified: Secondary | ICD-10-CM

## 2015-08-19 NOTE — Patient Instructions (Signed)
Today we updated your med list in our EPIC system...    Continue your current medications the same...  Continue your diet & exercise program...    The goal is to lose 15-20 lbs...  Continue to use the incentive spirometer to aid your diapharagmatic function...  Call for any questions...  Let's plan a follow up visit in 3-55months, sooner if needed for problems.Marland KitchenMarland Kitchen

## 2015-08-22 ENCOUNTER — Ambulatory Visit: Payer: 59 | Admitting: Pulmonary Disease

## 2015-12-10 DIAGNOSIS — R6883 Chills (without fever): Secondary | ICD-10-CM | POA: Insufficient documentation

## 2015-12-10 DIAGNOSIS — R350 Frequency of micturition: Secondary | ICD-10-CM | POA: Insufficient documentation

## 2015-12-10 DIAGNOSIS — R7303 Prediabetes: Secondary | ICD-10-CM | POA: Insufficient documentation

## 2015-12-10 DIAGNOSIS — J019 Acute sinusitis, unspecified: Secondary | ICD-10-CM | POA: Insufficient documentation

## 2015-12-10 DIAGNOSIS — R1011 Right upper quadrant pain: Secondary | ICD-10-CM | POA: Insufficient documentation

## 2015-12-15 ENCOUNTER — Encounter: Payer: Self-pay | Admitting: Pulmonary Disease

## 2015-12-15 NOTE — Progress Notes (Signed)
Subjective:     Patient ID: Ashley Mcconnell, female   DOB: January 10, 1961, 55 y.o.   MRN: [id number]  HPI ~  July 25, 2015:  Initial pulmonary consult by SN>        69 y/o WF, referred by [name] at [facility]- for a pulmonary evaluation>  Ashley Mcconnell complains of a several month hx of a shortness of breath sensation- literally feeliing like she is taking short breaths, can't take a deep breath, and not getting enough air "IN"- esp in her right lung she says; she notes that her right side doesn't inflate normally; she notes this w/ activities like walking fast, carrying loads, or climbing stairs; it seems to be helped by slowing down & taking slow deep breaths; she used to do zoomba 4d/wk but last did this 5/16 & she started getting winded/ decreased capacity/ etc... Around that time she had considerable stress in her life when her daughter's fiance was killed & duaghter & 2 grand children live w/ her now...  Ashley Mcconnell is essentially a never smoker & denies any prev hx of lung diseases; she used to note SOB in cold weather & w/ climbing stairs to a 2nd floor apt but was never diagnosed w/ asthma etc; she's had some left lower CP & LUQ pain & tenderness believed to be CWP; she did have one bout of walking pneumonia 5-6 yrs ago and occas episodes of bronchitis but always improved w/ antibiotics etc; she denies any chr lung dis, never had Tb or exposure, & no known occupational exposures...        Her PMH is pos for HBP and she thinks some sort of "cardiomyopathy- a thickening" diagnosed in [place] in the past, but she had a cath that she says was neg;  She is obese weighing 246# in a 5'5" frame for a BMI= 41; she is too sedentary but is trying to walk more; she denies snoring or known sleep disordered breathing; states she wakes feeling OK most days, but gets tired in the afternoons but rarely naps;  She has some heartburn/ reflux and takes Prilosec in the AM, notes occais cough at night, Ashley Mcconnell is her GI &  he plans an EGD soon she says;  She also has a hx of migraine HAs and sees DrPenumalli for this- on Elavil...      EXAM shows Afeb, VSS, wt=246#, BMI=41;  HEENT- neg, mallampati2;  Chest- decr BS at left base w/o w/r/r;  Heart- RR w/o m/r/g;  Abd- obese, panniculus, sl tender left side & costal margin;  Ext- neg w/o c/c/e...  CXR 07/11/15 in Epic/PACS showed norm heart size, elevated left hemidiaph, min basilar atx, sl peribronch thiclkening at the bases;  Old CXRs back to 2014 w/ similar left hemidiaph changes...   Spirometry 07/25/15 showed FVC=1.96 (58%), FEV1=1.65 (60%), %1sec=84, mid-flows are min reduced at 76% predicted... 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  Ambulatory oxygen saturation test 07/25/15>  O2sat=97% on RA at rest w/ pulse=70/min;  She ambulated 3 laps in the office w/ lowest O2saqt=90% w/ pulse=115/min...  Chest Fluoro "sniff test" to check left hemidiaph status> done 07/29/15 & showed normal diaph function- mild elev left hemidiaph w/ normal movement on insp & sniff test (no paradoxical movement). 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      GERD/ LPR> followed by [facility] for GI; we reviewed a vigorous antireflux regimen while his work up is being completed...      Morbid Obesity> she denies snoring or sleep disordered breathing but has some somnolence & was prev referred for sleep eval in 2014 but never had it; this may need to be ruled out...      Hx migraine  headaches> followed by DrPenumalli on Elavil...      Anxiety state>  This may account for a lot of the recent SOB, can't get a deep breath symptoms; we will consider adding Klonopin later if needed... PLAN >>       Ashley Mcconnell has obesity & an elev left hemidiaph contributing to pulmonary restriction- we discussed the need for DIET/ EXERCISE PROGRAM/ and WEIGHT REDUCTION; she will also practice w/ an INCENTIVE SPIROMETER device...  She also appears to have GERD/ LPR and is seeing [facility] for GI- we will institute a vigorous antireflux regimen w/ Prilosec 30 min before dinner, NPO after dinner, elev HOB on 6" blocks...  She also has chronic CWP & dyspnea partially related to this & "chest wall musc spasm"- rec to use heating pad & we will consider adding Klonopin if needed; we plan ROV in 4-6 weeks.  ~  August 19, 2015:  3wk ROV & pulmonary recheck>  Ashley Mcconnell returns and reports that she breathing is about the same=> sniff test showed that the left hemidiaph moves normally & she is reminded to use the IS device regularly as a lung exerciser;  Her weight is the same (245#, BMI=41) and we again reviewed diet/ exercise/ weight reduction strategies;  We also reviewed the needed antireflux regimen=> Prilosec 4min before dinner, elev HOB 6", NPO after dinner in the eve;  She has not tried the heating pad to chest wall or the Klonopin Rx...  SEE PROB LIST ABOVE- we stressed compliance w/ the prescribed regimen...    Past Medical History  Diagnosis Date  . Kidney stones   . Pulmonary embolism ([place])   . Migraine   . Cardiomyopathy ([place])   . Morton's neuroma     L Foot    Past Surgical History  Procedure Laterality Date  . Lithotripsy      x5  . Cholecystectomy    . Abdominal hysterectomy    . Tonsillectomy  1969  . Cardiac surgery  04/2014    Catherization     Outpatient Encounter Prescriptions as of 08/19/2015  Medication Sig  . amitriptyline (ELAVIL) 25 MG tablet Take 1 tablet (25 mg total) by mouth  at bedtime.  Marland Kitchen omeprazole (PRILOSEC) 20 MG capsule Take 20 mg by mouth daily.  . ondansetron (ZOFRAN) 4 MG tablet    No facility-administered encounter medications on file as of 08/19/2015.    Allergies  Allergen Reactions  . Contrast Media [Iodinated Diagnostic Agents]     Rash   . Fish Allergy Rash    Ashley Mcconnell is only fish she does not have allergy to    Family History  Problem Relation Age of Onset  . Breast cancer Mother   . Stroke Father   .  Heart Problems Father     heart bypass  . Alzheimer's disease Paternal Aunt   . Alzheimer's disease Paternal Uncle   . Heart Problems Paternal Grandmother     enlarged heart  . Alzheimer's disease Paternal Aunt   . Alzheimer's disease Paternal Aunt     Social History   Social History  . Marital Status: Single    Spouse Name: N/A  . Number of Children: 3  . Years of Education: College   Occupational History  .  Other    Ashley Mcconnell.    Social History Main Topics  . Smoking status: Former Smoker -- 0.20 packs/day for 2 years    Types: Cigarettes    Quit date: 07/24/1986  . Smokeless tobacco: Never Used     Comment: social smoker  . Alcohol Use: 0.0 oz/week    0 Standard drinks or equivalent per week     Comment: 1 drink on New Year's Eve  . Drug Use: No  . Sexual Activity: Not on file   Other Topics Concern  . Not on file   Social History Narrative   Patient lives at home with her family.   Caffeine Use: 1 cup of tea two times weekly    Current Medications, Allergies, Past Medical History, Past Surgical History, Family History, and Social History were reviewed in [employer] record.   Review of Systems              All symptoms NEG except where BOLDED >>  Constitutional:  F/C/S, fatigue, anorexia, unexpected weight change. HEENT:  HA, visual changes, hearing loss, earache, nasal symptoms, sore throat, mouth sores, hoarseness. Resp:  cough, sputum, hemoptysis; SOB, tightness,  wheezing. Cardio:  CP, palpit, DOE, orthopnea, edema. GI:  N/V/D/C, blood in stool; reflux, abd pain, distention, gas. GU:  dysuria, freq, urgency, hematuria, flank pain, voiding difficulty. MS:  joint pain, swelling, tenderness, decr ROM; neck pain, back pain, etc. Neuro:  HA, tremors, seizures, dizziness, syncope, weakness, numbness, gait abn. Skin:  suspicious lesions or skin rash. Heme:  adenopathy, bruising, bleeding. Psyche:  confusion, agitation, sleep disturbance, hallucinations, anxiety, depression suicidal.   Objective:   Physical Exam        Vital Signs:  Reviewed...  General:  WD, obese, 55 y/o WF in NAD; alert & oriented; pleasant & cooperative... HEENT:  [place]/AT; Conjunctiva- pink, Sclera- nonicteric, EOM-wnl, PERRLA, EACs-clear, TMs-wnl; NOSE-clear; THROAT-clear & wnl. Neck:  Supple w/ fair ROM; no JVD; normal carotid impulses w/o bruits; no thyromegaly or nodules palpated; no lymphadenopathy. Chest:  Clear to P & A w/ decr BS left base; without wheezes, rales, or rhonchi heard. Heart:  Regular Rhythm; norm S1 & S2 without murmurs, rubs, or gallops detected. Abdomen:  Soft & nontender, panniculus- sl tender left side, no guarding or rebound; normal bowel sounds; no organomegaly or masses palpated. Ext:  Normal ROM; without deformities or arthritic changes; no varicose veins, venous insuffic, or edema;  Pulses intact w/o bruits. Neuro:  CNs II-XII intact; motor testing normal; sensory testing normal; gait normal & balance OK. Derm:  No lesions noted; no rash etc. Lymph:  No cervical, supraclavicular, axillary, or inguinal adenopathy palpated...   Assessment:      IMP >>       Dyspnea, cough, CWP> he resp symptoms are somewhat vague, mild, & appear long standing- most likely multifactorial due to restriction, elev left hemidiaph, LPR, obesity, deconditioning, etc...      Elev left hemidiaphragm> this has  been present & unchanged by CXR back to 2014 film; no prev surg/  trauma/ etc and the etiology of ?idiopathic, we will check "sniff test" to see if paralyzed...      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      GERD/ LPR> followed by [facility] for GI; we reviewed a vigorous antireflux regimen while his work up is being completed...      Morbid Obesity> she denies snoring or sleep disordered breathing but has some somnolence & was prev referred for sleep eval in 2014 but never had it; this may need to be ruled out...      Hx migraine headaches> followed by DrPenumalli on Elavil...      Anxiety state>  This may account for a lot of the recent SOB, can't get a deep breath symptoms; we will consider adding Klonopin later if needed...  PLAN >>       Karisa has obesity & an elev left hemidiaph contributing to pulmonary restriction- we discussed the need for DIET/ EXERCISE PROGRAM/ and WEIGHT REDUCTION; she will also practice w/ an INCENTIVE SPIROMETER device...  She also appears to have GERD/ LPR and is seeing [facility] for GI- we will institute a vigorous antireflux regimen w/ Prilosec 30 min before dinner, NPO after dinner, elev HOB on 6" blocks...  She also has chronic CWP & dyspnea partially related to this & "chest wall musc spasm"- rec to use heating pad & we will consider adding Klonopin if needed; we plan ROV in 4-6 weeks.     Plan:     Patient's Medications  New Prescriptions   No medications on file  Previous Medications   AMITRIPTYLINE (ELAVIL) 25 MG TABLET    Take 1 tablet (25 mg total) by mouth at bedtime.   OMEPRAZOLE (PRILOSEC) 20 MG CAPSULE    Take 20 mg by mouth daily.   ONDANSETRON (ZOFRAN) 4 MG TABLET      Modified Medications   No medications on file  Discontinued Medications   No medications on file

## 2015-12-17 ENCOUNTER — Ambulatory Visit: Payer: 59 | Admitting: Pulmonary Disease

## 2015-12-26 IMAGING — RF DG SNIFF TEST
4 series · 15 of 24 positions shown · non-contrast
Comparison: None.

CLINICAL DATA: Worsening shortness of breath. Elevation of left
hemidiaphragm seen on chest radiograph.

EXAM:
CHEST FLUOROSCOPY
TECHNIQUE: Real-time fluoroscopic evaluation of the chest was performed to
evaluate diaphragmatic motion during deep respiration and sniff
test.
FLUOROSCOPY TIME:  1 minutes 5 seconds

[Series 1: run · 1 of 1 slices shown (1 of 4)]
[im 1/1]
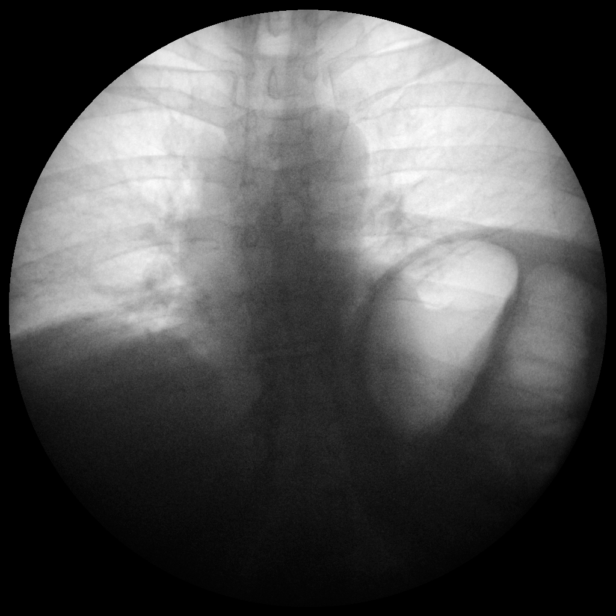

[Series 2: run · 3 of 10 slices shown (2 of 4)]
[im 3/10]
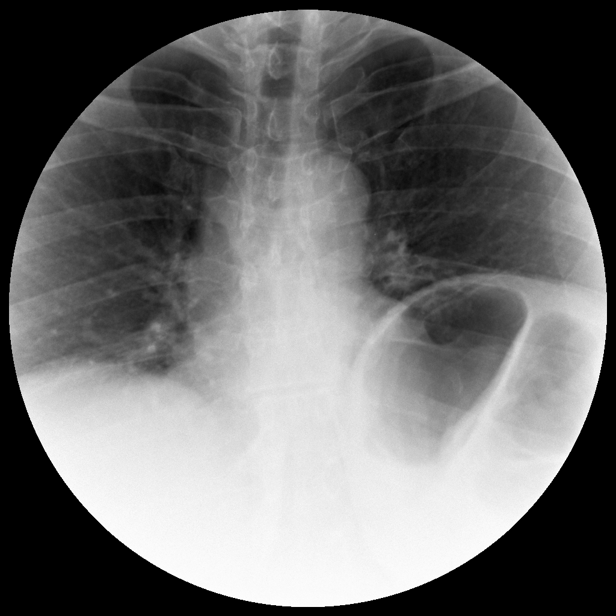
[im 7/10]
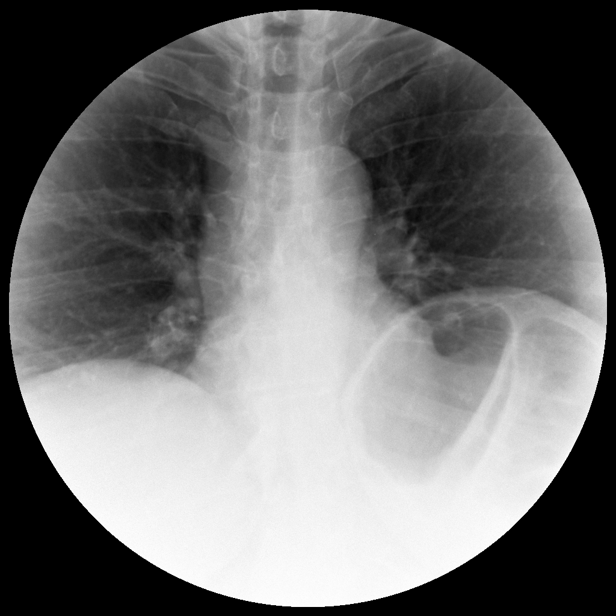
[im 10/10]
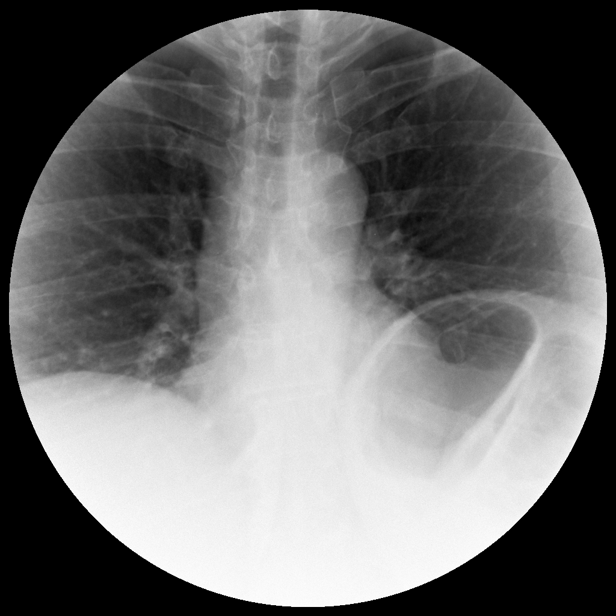

[Series 3: run · 6 of 17 slices shown (3 of 4)]
[im 2/17]
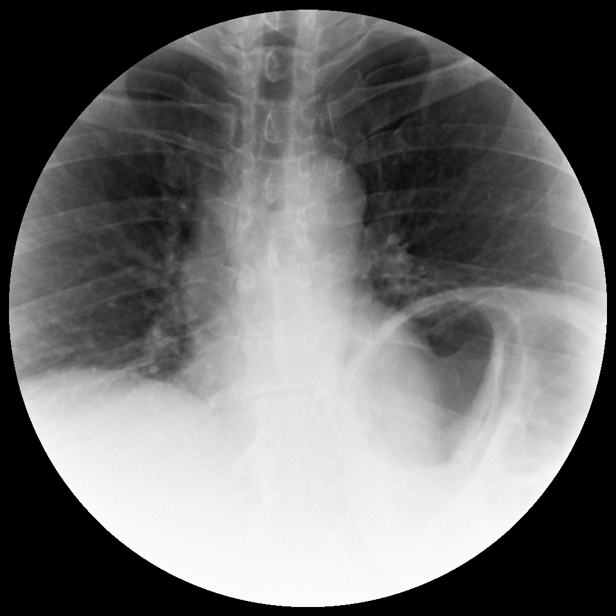
[im 4/17]
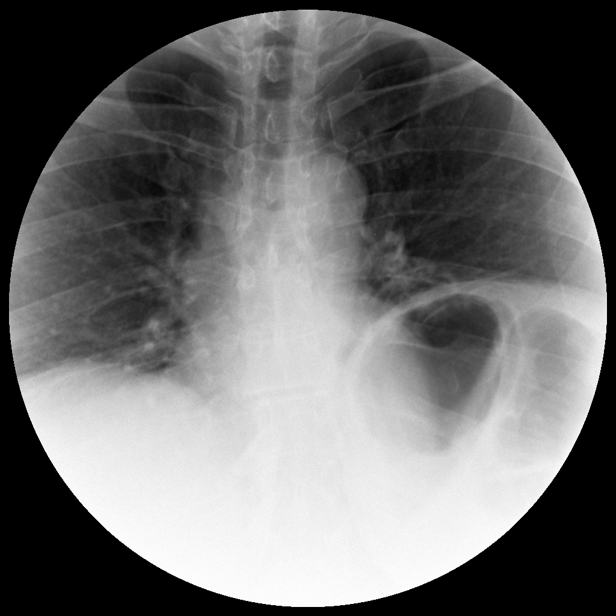
[im 8/17]
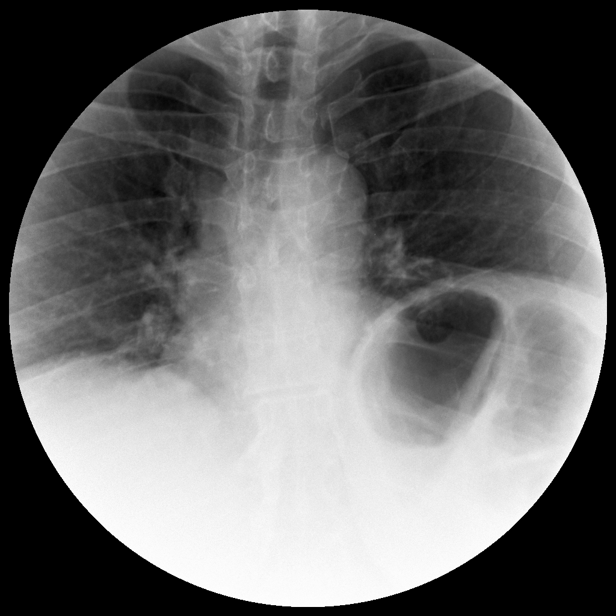
[im 11/17]
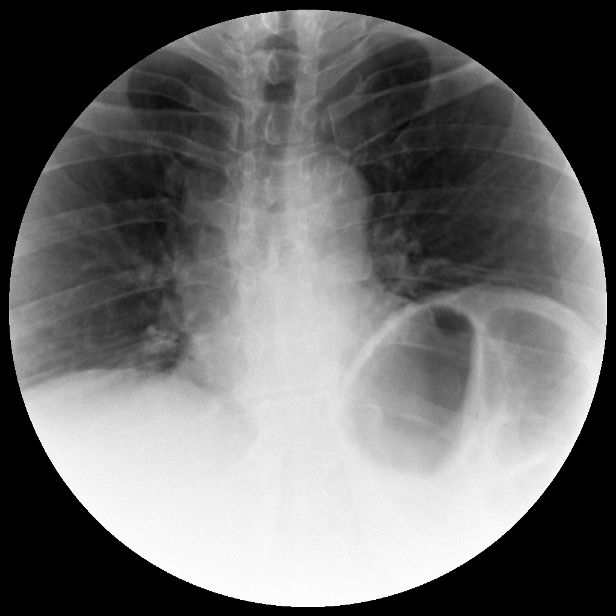
[im 13/17]
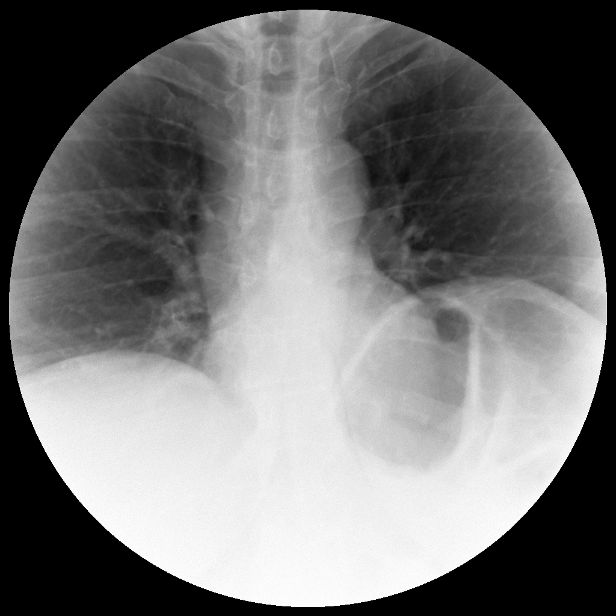
[im 17/17]
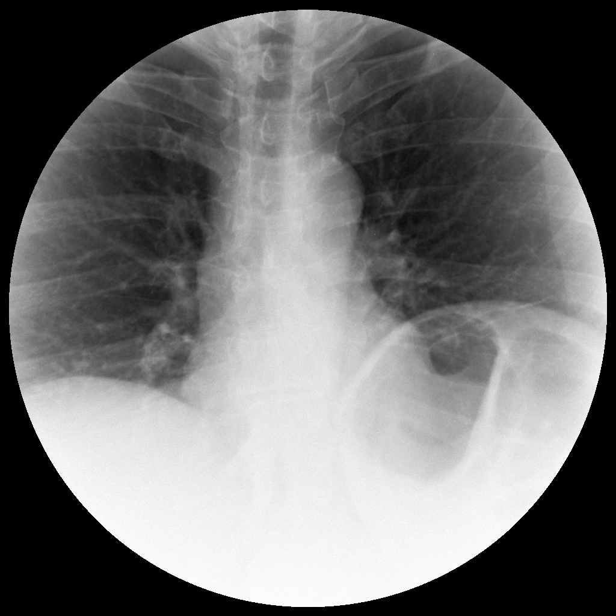

[Series 4: run · 5 of 13 slices shown (4 of 4)]
[im 1/13]
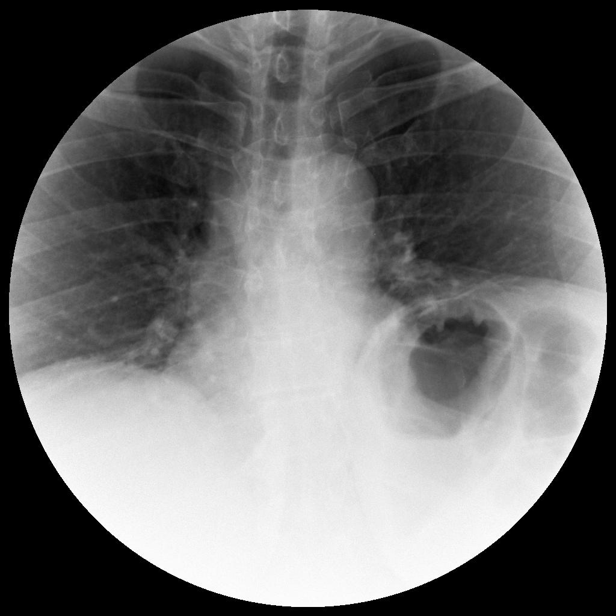
[im 4/13]
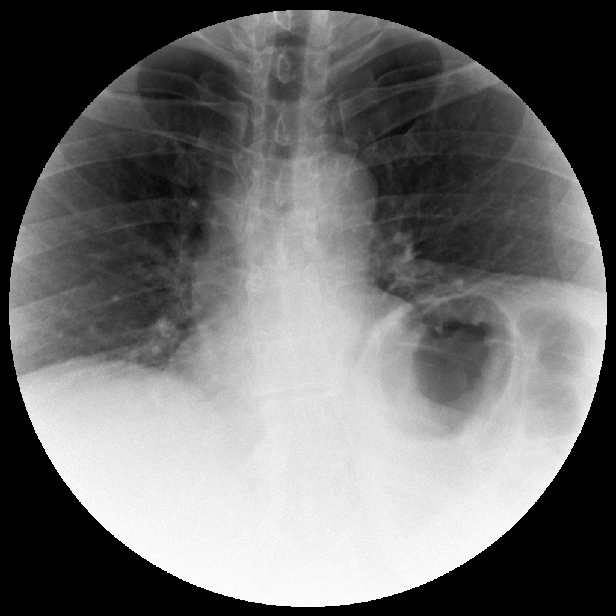
[im 7/13]
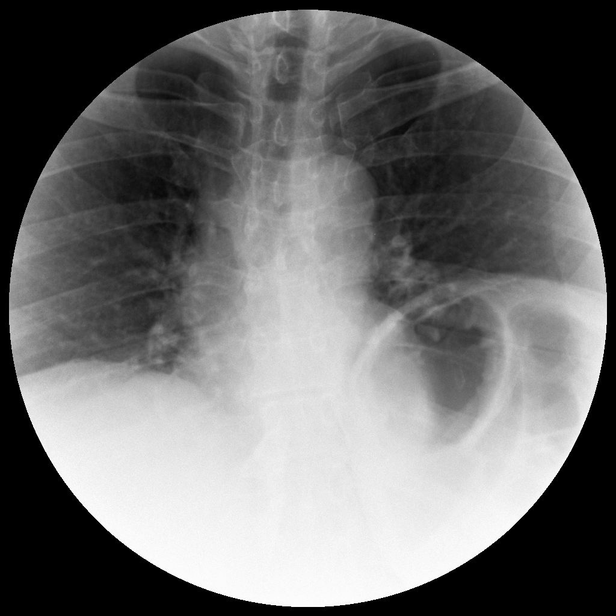
[im 9/13]
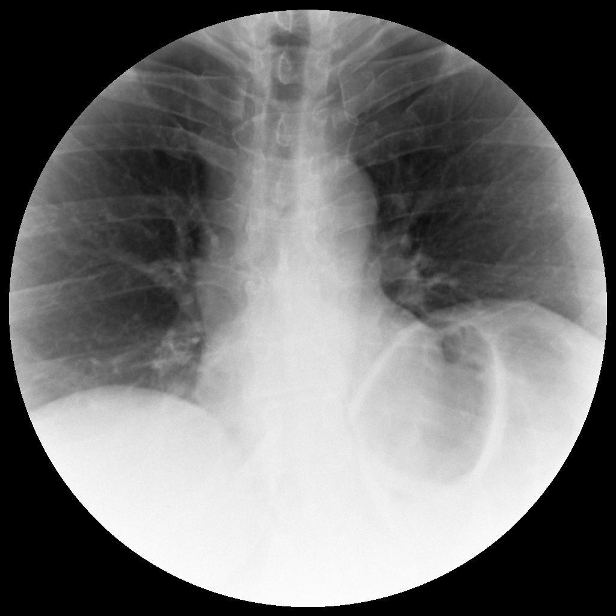
[im 13/13]
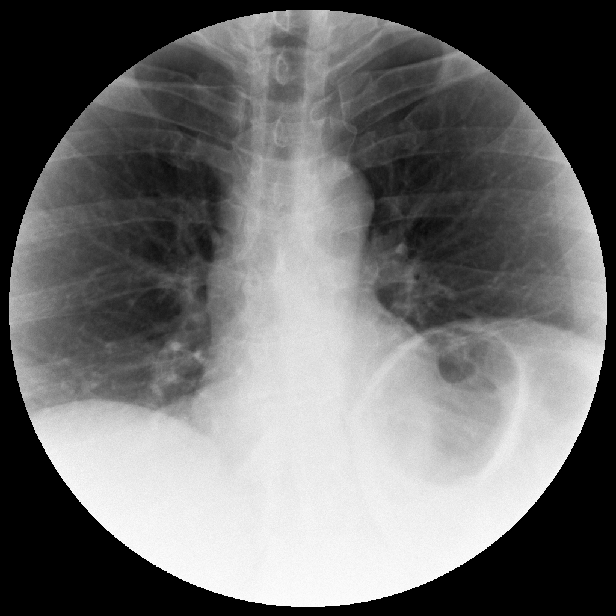

[15 of 24 positions shown; findings below may reference images not displayed]

FINDINGS: Mild elevation of left hemidiaphragm is seen at rest. Both
hemidiaphragm show normal movement during deep inspiration and
during sniff test. No evidence of paradoxical motion involving
either left or right hemidiaphragm.
IMPRESSION: Elevation of left hemidiaphragm noted. Negative sniff test. No
evidence of diaphragmatic paralysis.

## 2016-03-05 ENCOUNTER — Ambulatory Visit (INDEPENDENT_AMBULATORY_CARE_PROVIDER_SITE_OTHER): Payer: 59 | Admitting: Adult Health

## 2016-03-05 ENCOUNTER — Ambulatory Visit (INDEPENDENT_AMBULATORY_CARE_PROVIDER_SITE_OTHER)
Admission: RE | Admit: 2016-03-05 | Discharge: 2016-03-05 | Disposition: A | Discharge status: Home / Self Care | Payer: 59 | Source: Ambulatory Visit | Attending: Adult Health | Admitting: Adult Health

## 2016-03-05 ENCOUNTER — Encounter: Payer: Self-pay | Admitting: Adult Health

## 2016-03-05 VITALS — BP 136/72 | HR 90 | Temp 98.8°F | Ht 65.0 in | Wt 240.0 lb

## 2016-03-05 DIAGNOSIS — R059 Cough, unspecified: Secondary | ICD-10-CM

## 2016-03-05 DIAGNOSIS — R05 Cough: Secondary | ICD-10-CM

## 2016-03-05 DIAGNOSIS — J209 Acute bronchitis, unspecified: Secondary | ICD-10-CM

## 2016-03-05 LAB — NITRIC OXIDE: NITRIC OXIDE: 14

## 2016-03-05 MED ORDER — PREDNISONE 10 MG PO TABS: ORAL_TABLET | ORAL | Status: DC

## 2016-03-05 NOTE — Progress Notes (Signed)
Subjective:    Patient ID: Ashley Mcconnell, female    DOB: 11-12-1960, 55 y.o.   MRN: [id number]  HPI 55 yo female with minimal smoking hx  seen for pulmonary consult 08/2015 with Dr. Lenna Gilford  For cough and dyspnea.   TEST   CXR 07/11/15 in Epic/PACS showed norm heart size, elevated left hemidiaph, min basilar atx, sl peribronch thiclkening at the bases; Old CXRs back to 2014 w/ similar left hemidiaph changes...   Spirometry 07/25/15 showed FVC=1.96 (58%), FEV1=1.65 (60%), %1sec=84, mid-flows are min reduced at 76% predicted... 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  Ambulatory oxygen saturation test 07/25/15> O2sat=97% on RA at rest w/ pulse=70/min; She ambulated 3 laps in the office w/ lowest O2saqt=90% w/ pulse=115/min...  Chest Fluoro "sniff test" to check left hemidiaph status> done 07/29/15 & showed normal diaph function- mild elev left hemidiaph w/ normal movement on insp & sniff test (no paradoxical movement).                       03/05/2016 Acute OV  Pt presents for an acute office visit. Complains of prod cough with light green/gray colored mucus and chest congestion starting 4 weeks ago. Seen by PCP 5/9, given zpack and codeine cough syrup .she is feeling better but cough is lingering. . Denies any sinus pressure/drainage, chest tightness, fever, nausea or vomiting. FENO today 14.   Past Medical History  Diagnosis Date  . Kidney stones   . Pulmonary embolism ([place])   . Migraine   . Cardiomyopathy ([place])   . Morton's neuroma     L Foot   Current Outpatient Prescriptions on File Prior to Visit  Medication Sig Dispense Refill  . omeprazole (PRILOSEC) 20 MG capsule Take 20 mg by mouth daily.    . ondansetron (ZOFRAN) 4 MG tablet Reported on 03/05/2016    . amitriptyline (ELAVIL) 25 MG tablet Take 1 tablet (25 mg total) by mouth at bedtime. (Patient not taking: Reported on 03/05/2016) 30 tablet 6   No current facility-administered medications on file  prior to visit.      Review of Systems Constitutional:   No  weight loss, night sweats,  Fevers, chills, fatigue, or  lassitude.  HEENT:   No headaches,  Difficulty swallowing,  Tooth/dental problems, or  Sore throat,                No sneezing, itching, ear ache,  +nasal congestion, post nasal drip,   CV:  No chest pain,  Orthopnea, PND, swelling in lower extremities, anasarca, dizziness, palpitations, syncope.   GI  No heartburn, indigestion, abdominal pain, nausea, vomiting, diarrhea, change in bowel habits, loss of appetite, bloody stools.   Resp:    No chest wall deformity  Skin: no rash or lesions.  GU: no dysuria, change in color of urine, no urgency or frequency.  No flank pain, no hematuria   MS:  No joint pain or swelling.  No decreased range of motion.  No back pain.  Psych:  No change in mood or affect. No depression or anxiety.  No memory loss.         Objective:   Physical Exam  Filed Vitals:   03/05/16 1424  BP: 136/72  Pulse: 90  Temp: 98.8 F (37.1 C)  TempSrc: Oral  Height: 5\' 5"  (1.651 m)  Weight: 240 lb (108.863 kg)  SpO2: 97%    GEN: A/Ox3; pleasant , NAD  HEENT:  [place]/AT,  EACs-clear, TMs-wnl, NOSE-clear, THROAT-clear, no lesions, no postnasal drip or exudate noted.   NECK:  Supple w/ fair ROM; no JVD; normal carotid impulses w/o bruits; no thyromegaly or nodules palpated; no lymphadenopathy.  RESP  Clear  P & A; w/o, wheezes/ rales/ or rhonchi.no accessory muscle use, no dullness to percussion  CARD:  RRR, no m/r/g  , no peripheral edema, pulses intact, no cyanosis or clubbing.  GI:   Soft & nt; nml bowel sounds; no organomegaly or masses detected.  Musco: Warm bil, no deformities or joint swelling noted.   Neuro: alert, no focal deficits noted.    Skin: Warm, no lesions or rashes  Tammy Parrett NP-C  [facility]  03/05/2016       Assessment & Plan:

## 2016-03-05 NOTE — Patient Instructions (Addendum)
Prednisone taper over next week.  Chest xray today .  Mucinex Twice daily  As needed  Cough/congestion  Delsym 2 tsp Twice daily  For cough  Tessalon Three times a day  For cough  Add Zyrtec 10mg  At bedtime  follow up Dr. Lenna Gilford  In 6-8 weeks and As needed   Please contact office for sooner follow up if symptoms do not improve or worsen or seek emergency care

## 2016-03-11 NOTE — Progress Notes (Signed)
Quick Note:  Called spoke with pt. Reviewed results and recs. Pt voiced understanding and had no further questions. ______ 

## 2016-03-15 DIAGNOSIS — J209 Acute bronchitis, unspecified: Secondary | ICD-10-CM | POA: Insufficient documentation

## 2016-03-15 NOTE — Assessment & Plan Note (Signed)
Slow to resolve flare  Check cxr   Plan  Prednisone taper over next week.  Chest xray today .  Mucinex Twice daily  As needed  Cough/congestion  Delsym 2 tsp Twice daily  For cough  Tessalon Three times a day  For cough  Add Zyrtec 10mg  At bedtime  follow up Dr. Lenna Gilford  In 6-8 weeks and As needed   Please contact office for sooner follow up if symptoms do not improve or worsen or seek emergency care

## 2016-04-20 ENCOUNTER — Ambulatory Visit: Payer: 59 | Admitting: Pulmonary Disease

## 2016-05-26 ENCOUNTER — Other Ambulatory Visit: Payer: Self-pay | Admitting: Family Medicine

## 2016-05-26 ENCOUNTER — Ambulatory Visit
Admission: RE | Admit: 2016-05-26 | Discharge: 2016-05-26 | Disposition: A | Discharge status: Home / Self Care | Payer: 59 | Source: Ambulatory Visit | Attending: Family Medicine | Admitting: Family Medicine

## 2016-05-26 DIAGNOSIS — N644 Mastodynia: Secondary | ICD-10-CM

## 2016-05-26 DIAGNOSIS — R0789 Other chest pain: Secondary | ICD-10-CM

## 2016-06-10 ENCOUNTER — Ambulatory Visit
Admission: RE | Admit: 2016-06-10 | Discharge: 2016-06-10 | Disposition: A | Discharge status: Home / Self Care | Payer: 59 | Source: Ambulatory Visit | Attending: Family Medicine | Admitting: Family Medicine

## 2016-06-10 DIAGNOSIS — N644 Mastodynia: Secondary | ICD-10-CM

## 2017-05-05 ENCOUNTER — Ambulatory Visit (HOSPITAL_COMMUNITY)
Admission: EM | Admit: 2017-05-05 | Discharge: 2017-05-05 | Disposition: A | Discharge status: Home / Self Care | Payer: 59 | Attending: Emergency Medicine | Admitting: Emergency Medicine

## 2017-05-05 ENCOUNTER — Encounter (HOSPITAL_COMMUNITY): Payer: Self-pay | Admitting: Emergency Medicine

## 2017-05-05 DIAGNOSIS — Z87891 Personal history of nicotine dependence: Secondary | ICD-10-CM | POA: Insufficient documentation

## 2017-05-05 DIAGNOSIS — R109 Unspecified abdominal pain: Secondary | ICD-10-CM | POA: Insufficient documentation

## 2017-05-05 DIAGNOSIS — G43909 Migraine, unspecified, not intractable, without status migrainosus: Secondary | ICD-10-CM | POA: Insufficient documentation

## 2017-05-05 DIAGNOSIS — N760 Acute vaginitis: Secondary | ICD-10-CM | POA: Diagnosis not present

## 2017-05-05 DIAGNOSIS — I429 Cardiomyopathy, unspecified: Secondary | ICD-10-CM | POA: Insufficient documentation

## 2017-05-05 DIAGNOSIS — R5383 Other fatigue: Secondary | ICD-10-CM | POA: Diagnosis not present

## 2017-05-05 DIAGNOSIS — R591 Generalized enlarged lymph nodes: Secondary | ICD-10-CM | POA: Diagnosis not present

## 2017-05-05 DIAGNOSIS — Z86711 Personal history of pulmonary embolism: Secondary | ICD-10-CM | POA: Diagnosis not present

## 2017-05-05 DIAGNOSIS — R300 Dysuria: Secondary | ICD-10-CM | POA: Diagnosis not present

## 2017-05-05 DIAGNOSIS — Z87442 Personal history of urinary calculi: Secondary | ICD-10-CM | POA: Diagnosis not present

## 2017-05-05 LAB — CBC WITH DIFFERENTIAL/PLATELET
BASOS ABS: 0 10*3/uL (ref 0.0–0.1)
BASOS PCT: 0 %
EOS PCT: 3 %
Eosinophils Absolute: 0.2 10*3/uL (ref 0.0–0.7)
HCT: 40.5 % (ref 36.0–46.0)
Hemoglobin: 13.5 g/dL (ref 12.0–15.0)
Lymphocytes Relative: 28 %
Lymphs Abs: 2.4 10*3/uL (ref 0.7–4.0)
MCH: 28.1 pg (ref 26.0–34.0)
MCHC: 33.3 g/dL (ref 30.0–36.0)
MCV: 84.2 fL (ref 78.0–100.0)
MONO ABS: 0.4 10*3/uL (ref 0.1–1.0)
Monocytes Relative: 4 %
Neutro Abs: 5.6 10*3/uL (ref 1.7–7.7)
Neutrophils Relative %: 65 %
PLATELETS: 281 10*3/uL (ref 150–400)
RBC: 4.81 MIL/uL (ref 3.87–5.11)
RDW: 12.5 % (ref 11.5–15.5)
WBC: 8.6 10*3/uL (ref 4.0–10.5)

## 2017-05-05 LAB — COMPREHENSIVE METABOLIC PANEL
ALBUMIN: 4.5 g/dL (ref 3.5–5.0)
ALK PHOS: 58 U/L (ref 38–126)
ALT: 16 U/L (ref 14–54)
ANION GAP: 11 (ref 5–15)
AST: 21 U/L (ref 15–41)
BUN: 15 mg/dL (ref 6–20)
CALCIUM: 9.4 mg/dL (ref 8.9–10.3)
CHLORIDE: 102 mmol/L (ref 101–111)
CO2: 26 mmol/L (ref 22–32)
Creatinine, Ser: 0.85 mg/dL (ref 0.44–1.00)
GFR calc non Af Amer: >60 mL/min (ref >60)
GLUCOSE: 103 mg/dL — AB (ref 65–99)
Potassium: 3.9 mmol/L (ref 3.5–5.1)
SODIUM: 139 mmol/L (ref 135–145)
TOTAL PROTEIN: 7.8 g/dL (ref 6.5–8.1)
Total Bilirubin: 0.8 mg/dL (ref 0.3–1.2)

## 2017-05-05 LAB — POCT URINALYSIS DIP (DEVICE)
Bilirubin Urine: NEGATIVE
Glucose, UA: NEGATIVE mg/dL
KETONES UR: NEGATIVE mg/dL
Leukocytes, UA: NEGATIVE
NITRITE: NEGATIVE
PH: 5.5 (ref 5.0–8.0)
PROTEIN: NEGATIVE mg/dL
Specific Gravity, Urine: >=1.03 (ref 1.005–1.030)
UROBILINOGEN UA: 0.2 mg/dL (ref 0.0–1.0)

## 2017-05-05 LAB — SEDIMENTATION RATE: SED RATE: 10 mm/h (ref 0–22)

## 2017-05-05 LAB — C-REACTIVE PROTEIN

## 2017-05-05 LAB — TSH: TSH: 2.736 u[IU]/mL (ref 0.350–4.500)

## 2017-05-05 MED ORDER — METRONIDAZOLE 500 MG PO TABS: 500.0000 mg | ORAL_TABLET | Freq: Two times a day (BID) | ORAL | 0 refills | Status: DC

## 2017-05-05 MED ORDER — CIPROFLOXACIN HCL 500 MG PO TABS: 500.0000 mg | ORAL_TABLET | Freq: Two times a day (BID) | ORAL | 0 refills | Status: DC

## 2017-05-05 NOTE — Discharge Instructions (Signed)
For your vaginal itching, since this is felt Diflucan, nystatin, I'm treating empirically for a type of infection called BV. I prescribed metronidazole, take one tablet twice a day. Do not drink any alcohol while taking.  For your ongoing lymphadenopathy, I have prescribed a short coarse of cipro, take one tablet twice a day for one week. Multiple different blood tests are currently being run, and if there are abnormalities, you will be notified. Also, monitor MyChart for your results.  I have attached the contact information for [facility], contact them to schedule an appointment to establish for primary care.   Should you need additional testing, this will need to come from primary care.

## 2017-05-05 NOTE — ED Triage Notes (Signed)
Intermittent abdominal pain, back pain, migraines.  Has been treated by pcp for uti, bacterial infection and treated her nausea.  Patient says none of this has helped.

## 2017-05-05 NOTE — ED Provider Notes (Signed)
CSN: [id number]     Arrival date & time 05/05/17  1626 History   None    Chief Complaint  Patient presents with  . Abdominal Pain   (Consider location/radiation/quality/duration/timing/severity/associated sxs/prior Treatment) Ashley Mcconnell is a 56 y.o. female with past hx of kidney stones, migraines, PE, and cardiomyopathy, who presents to the [facility] with a chief complaint of multiple vague complaints. She does not currently have a primary care provider. She has had increased fatigue, headaches, lymphadenopathy, dysuria, and vaginal itching. She is not currently sexually active, states she has not been active in 6-7 years, no Hx of STDs/STIs. She has increased odor with urination, and discolored urine. She has been see at different urgent cares for these complaints three times and has no relief. She has had diflucan, nystatin, amoxicillin, and azithromycin without relief of her symptoms. She has not been tested for for yeast or BV. She is also requesting a screening mammogram as well.     The history is provided by the patient.    Past Medical History:  Diagnosis Date  . Cardiomyopathy ([place])   . Kidney stones   . Migraine   . Morton's neuroma    L Foot  . Pulmonary embolism [facility])    Past Surgical History:  Procedure Laterality Date  . ABDOMINAL HYSTERECTOMY    . CARDIAC SURGERY  04/2014   Catherization   . CHOLECYSTECTOMY    . LITHOTRIPSY     x5  . TONSILLECTOMY  1969   Family History  Problem Relation Age of Onset  . Breast cancer Mother   . Stroke Father   . Heart Problems Father        heart bypass  . Alzheimer's disease Paternal Aunt   . Alzheimer's disease Paternal Uncle   . Heart Problems Paternal Grandmother        enlarged heart  . Alzheimer's disease Paternal Aunt   . Alzheimer's disease Paternal Aunt    Social History  Substance Use Topics  . Smoking status: Former Smoker    Packs/day: 0.20    Years: 2.00    Types: Cigarettes    Quit  date: 07/24/1986  . Smokeless tobacco: Never Used     Comment: social smoker  . Alcohol use 0.0 oz/week     Comment: 1 drink on New Year's Eve   OB History    No data available     Review of Systems  Constitutional: Positive for chills and fatigue. Negative for fever.  HENT: Negative.   Respiratory: Negative.   Cardiovascular: Negative.   Gastrointestinal: Negative.   Endocrine:       No unintended weight loss or gain  Genitourinary: Positive for dysuria, flank pain, urgency and vaginal pain (itching). Negative for vaginal bleeding and vaginal discharge.  Musculoskeletal: Negative for neck pain and neck stiffness.  Skin: Negative for rash.  Neurological: Positive for headaches. Negative for dizziness, syncope and light-headedness.  Hematological: Positive for adenopathy.    Allergies  Contrast media [iodinated diagnostic agents] and Fish allergy  Home Medications   Prior to Admission medications   Medication Sig Start Date End Date Taking? Authorizing Provider  ranitidine (ZANTAC) 75 MG tablet Take 75 mg by mouth 2 (two) times daily.   Yes [provider]  ciprofloxacin (CIPRO) 500 MG tablet Take 1 tablet (500 mg total) by mouth 2 (two) times daily. 05/05/17   Barnet Glasgow, NP  metroNIDAZOLE (FLAGYL) 500 MG tablet Take 1 tablet (500 mg total) by  mouth 2 (two) times daily. 05/05/17   Barnet Glasgow, NP  omeprazole (PRILOSEC) 20 MG capsule Take 20 mg by mouth daily.    [provider]   Meds Ordered and Administered this Visit  Medications - No data to display  BP 135/72 (BP Location: Left Arm) Comment (BP Location): large cuff  Pulse 86   Temp 98.1 F (36.7 C)   Resp (!) 1   SpO2 98%  No data found.   Physical Exam  Urgent Care Course     Procedures (including critical care time)  Labs Review Labs Reviewed  COMPREHENSIVE METABOLIC PANEL - Abnormal; Notable for the following:       Result Value   Glucose, Bld 103 (*)    All other  components within normal limits  POCT URINALYSIS DIP (DEVICE) - Abnormal; Notable for the following:    Hgb urine dipstick MODERATE (*)    All other components within normal limits  CBC WITH DIFFERENTIAL/PLATELET  TSH  SEDIMENTATION RATE  C-REACTIVE PROTEIN  B. BURGDORFI ANTIBODIES  URINE CYTOLOGY ANCILLARY ONLY    Imaging Review No results found.    MDM   1. Fatigue, unspecified type   2. Lymphadenopathy   3. Dysuria   4. Vaginitis and vulvovaginitis     Pt given paper order for referral to the breast center for screening mammogram.  1. Fatigue: TSH, CBC, CMP, advised sleep Hygiene practices, nutrition, and exercise.   2. Lymphadenopathy: CBC, CMP, Sed Rate, CRP, L[place], will start Cipro empirically, follow up with [facility] for primary care.  3. Dysuria, UA unremarkable other than Hgb, presumed non-infectious cause such as stone, yeast infection, hygiene practices, etc. Will obtain culture and urine cyto.  4. Vaginitis: presumed BV, obtained urine cyto, treating empirically with metronidazole.  Follow up with [facility] for primary care.      Barnet Glasgow, NP 05/05/17 2040

## 2017-05-06 LAB — B. BURGDORFI ANTIBODIES

## 2017-05-10 LAB — URINE CYTOLOGY ANCILLARY ONLY
BACTERIAL VAGINITIS: NEGATIVE
Candida vaginitis: NEGATIVE

## 2017-05-16 ENCOUNTER — Other Ambulatory Visit (HOSPITAL_COMMUNITY): Payer: Self-pay | Admitting: Emergency Medicine

## 2017-05-17 ENCOUNTER — Other Ambulatory Visit (HOSPITAL_COMMUNITY): Payer: Self-pay | Admitting: Emergency Medicine

## 2017-05-17 DIAGNOSIS — N644 Mastodynia: Secondary | ICD-10-CM

## 2017-05-19 ENCOUNTER — Ambulatory Visit
Admission: RE | Admit: 2017-05-19 | Discharge: 2017-05-19 | Disposition: A | Discharge status: Home / Self Care | Payer: 59 | Source: Ambulatory Visit | Attending: Emergency Medicine | Admitting: Emergency Medicine

## 2017-05-19 DIAGNOSIS — N644 Mastodynia: Secondary | ICD-10-CM

## 2017-06-08 ENCOUNTER — Ambulatory Visit (INDEPENDENT_AMBULATORY_CARE_PROVIDER_SITE_OTHER): Payer: 59

## 2017-06-08 ENCOUNTER — Other Ambulatory Visit: Payer: Self-pay | Admitting: Podiatry

## 2017-06-08 ENCOUNTER — Telehealth: Payer: Self-pay | Admitting: Podiatry

## 2017-06-08 ENCOUNTER — Encounter: Payer: Self-pay | Admitting: Podiatry

## 2017-06-08 ENCOUNTER — Ambulatory Visit (INDEPENDENT_AMBULATORY_CARE_PROVIDER_SITE_OTHER): Payer: 59 | Admitting: Podiatry

## 2017-06-08 VITALS — BP 127/80 | HR 96 | Resp 16

## 2017-06-08 DIAGNOSIS — S99922A Unspecified injury of left foot, initial encounter: Secondary | ICD-10-CM

## 2017-06-08 DIAGNOSIS — M79672 Pain in left foot: Secondary | ICD-10-CM

## 2017-06-08 DIAGNOSIS — D3610 Benign neoplasm of peripheral nerves and autonomic nervous system, unspecified: Secondary | ICD-10-CM

## 2017-06-08 MED ORDER — DICLOFENAC SODIUM 75 MG PO TBEC: 75.0000 mg | DELAYED_RELEASE_TABLET | Freq: Two times a day (BID) | ORAL | 2 refills | Status: DC

## 2017-06-08 NOTE — Progress Notes (Signed)
Subjective:    Patient ID: Ashley Mcconnell, female   DOB: 56 y.o.   MRN: [id number]   HPI patient states that she traumatized the big toenail left foot and it has been sore and also she is wondering about a history of neuroma with occasional numbness at the end of the day    Review of Systems  All other systems reviewed and are negative.       Objective:  Physical Exam  Constitutional: She appears well-developed and well-nourished.  Pulmonary/Chest: Breath sounds normal.  Musculoskeletal: Normal range of motion.  Neurological: She is alert.  Skin: Skin is warm.  Nursing note and vitals reviewed.  neurovascular status intact negative Homans sign was noted with patient found to have edema in the left hallux mild discomfort lesser MPJs and no current indication of active neuroma symptomatology   Assessment:   Probability is strong for trauma or fracture of the left hallux with inflammatory irritation from injury      Plan:  H&P condition reviewed and advised this patient on anti-inflammatories and I did write a prescription for diclofenac. Utilize rigid bottom shoes and reappoint as needed  X-rays indicate no fracture the left hallux currently

## 2017-06-08 NOTE — Addendum Note (Signed)
Addended by: Celene Skeen A on: 06/08/2017 05:00 PM   Modules accepted: Orders

## 2017-06-08 NOTE — Telephone Encounter (Signed)
I was seen by Dr. Paulla Dolly and he was supposed to send an Rx to my pharmacy which is [facility] in [place]. I just got here and they do not have it. I put the pt on hold and went and spoke to [place]. Juliann Pulse looked and when it was sent the first time it did not go through. She re-submitted. I got the pt back on the phone and apologized and told her what had happened and that her pharmacy should have it ready for her soon. Pt stated she hoped so because she is supposed to be on the road to go out of town in the next 30 minutes. I again apologized to the patient.

## 2017-06-08 NOTE — Progress Notes (Signed)
   Subjective:    Patient ID: Ashley Mcconnell, female    DOB: Mar 22, 1961, 56 y.o.   MRN: [id number]  HPI Chief Complaint  Patient presents with  . Foot Injury    Left foot; great toe & plantar forefoot; pt stated, "Slipped in the rain wearing flip flops a week ago; toe went straight into concrete; was Dx 3 years ago with Morton's Neuroma"      Review of Systems  Constitutional: Positive for appetite change and fatigue.  Eyes: Positive for visual disturbance.  Cardiovascular: Positive for leg swelling.  Musculoskeletal: Positive for arthralgias and back pain.  Skin: Positive for rash.  Allergic/Immunologic: Positive for food allergies.  Neurological: Positive for weakness, numbness and headaches.  Hematological: Bruises/bleeds easily.  All other systems reviewed and are negative.      Objective:   Physical Exam        Assessment & Plan:

## 2017-09-23 ENCOUNTER — Encounter (HOSPITAL_COMMUNITY): Payer: Self-pay | Admitting: Emergency Medicine

## 2017-09-23 ENCOUNTER — Other Ambulatory Visit: Payer: Self-pay

## 2017-09-23 ENCOUNTER — Emergency Department (HOSPITAL_COMMUNITY): Payer: 59

## 2017-09-23 ENCOUNTER — Emergency Department (HOSPITAL_COMMUNITY)
Admission: EM | Admit: 2017-09-23 | Discharge: 2017-09-23 | Disposition: A | Discharge status: Home / Self Care | Payer: 59 | Attending: Emergency Medicine | Admitting: Emergency Medicine

## 2017-09-23 DIAGNOSIS — Z79899 Other long term (current) drug therapy: Secondary | ICD-10-CM | POA: Diagnosis not present

## 2017-09-23 DIAGNOSIS — Z87891 Personal history of nicotine dependence: Secondary | ICD-10-CM | POA: Diagnosis not present

## 2017-09-23 DIAGNOSIS — R079 Chest pain, unspecified: Secondary | ICD-10-CM | POA: Diagnosis present

## 2017-09-23 LAB — BASIC METABOLIC PANEL
ANION GAP: 9 (ref 5–15)
BUN: 13 mg/dL (ref 6–20)
CALCIUM: 9.2 mg/dL (ref 8.9–10.3)
CO2: 26 mmol/L (ref 22–32)
Chloride: 105 mmol/L (ref 101–111)
Creatinine, Ser: 0.7 mg/dL (ref 0.44–1.00)
GFR calc Af Amer: >60 mL/min (ref >60)
GFR calc non Af Amer: >60 mL/min (ref >60)
GLUCOSE: 109 mg/dL — AB (ref 65–99)
Potassium: 3.6 mmol/L (ref 3.5–5.1)
Sodium: 140 mmol/L (ref 135–145)

## 2017-09-23 LAB — I-STAT BETA HCG BLOOD, ED (MC, WL, AP ONLY): I-stat hCG, quantitative: <5 m[IU]/mL (ref <5)

## 2017-09-23 LAB — CBC
HEMATOCRIT: 43.2 % (ref 36.0–46.0)
HEMOGLOBIN: 14.4 g/dL (ref 12.0–15.0)
MCH: 28.9 pg (ref 26.0–34.0)
MCHC: 33.3 g/dL (ref 30.0–36.0)
MCV: 86.7 fL (ref 78.0–100.0)
Platelets: 255 10*3/uL (ref 150–400)
RBC: 4.98 MIL/uL (ref 3.87–5.11)
RDW: 12.9 % (ref 11.5–15.5)
WBC: 8.5 10*3/uL (ref 4.0–10.5)

## 2017-09-23 LAB — I-STAT TROPONIN, ED
TROPONIN I, POC: 0.01 ng/mL (ref 0.00–0.08)
Troponin i, poc: 0 ng/mL (ref 0.00–0.08)

## 2017-09-23 MED ORDER — ACETAMINOPHEN 500 MG PO TABS
1000.0000 mg | ORAL_TABLET | Freq: Once | ORAL | Status: AC
  Administered 2017-09-23: 1000 mg via ORAL
  Filled 2017-09-23: qty 2

## 2017-09-23 NOTE — ED Notes (Signed)
EDP at bedside. Raquel Sarna, [place]

## 2017-09-23 NOTE — ED Triage Notes (Addendum)
Pt states left sided chest pain since 1pm yesterday, with upper back pain for 3 days radiating into the neck yesterday. Pt states hx of cardiomyopathy, EKG shows NSR. Pain currently 6/10. Pt also has shortness of breath that started last night. occasional cough. Denies fevers.

## 2017-09-23 NOTE — ED Notes (Signed)
Patient transported to X-ray 

## 2017-09-23 NOTE — ED Notes (Signed)
Pt ambulated to restroom from room, tolerated well. 

## 2017-09-23 NOTE — Discharge Instructions (Addendum)
Your EKG, labwork and chest xray were reassuring today.  I have listed the information below to [facility] (located across the street from [place]).  Please schedule an appointment to establish care for follow-up of your ER visit today.  Your blood sugar was also elevated, please have this rechecked.  Return to the emergency department if your chest pain gets worse with exercise, associated with sweating or nausea, radiates to the left arm/jaw, worsens or becomes concerning in any way.

## 2017-09-23 NOTE — ED Provider Notes (Signed)
[facility] EMERGENCY DEPARTMENT Provider Note   CSN: [id number] Arrival date & time: 09/23/17  1006     History   Chief Complaint Chief Complaint  Patient presents with  . Chest Pain    HPI Ashley Mcconnell is a 56 y.o. female.  HPI   Ashley Mcconnell is a 56yo female with a history of obesity, GERD who presents to the emergency department for evaluation of left-sided chest pain.  Patient states that she developed left upper back pain which radiated to the left neck about three days ago.  Yesterday she developed sudden onset "sharp" left-sided chest pain which was 7/10 in severity and nonradiating.  Pain has been intermittent since that time, with episodes of sharp, stabbing chest pain lasting about 1 minute at a time. She states she maybe feels some shortness of breath.  Tried taking Aleve and aspirin for her symptoms without significant relief.  She is unsure of any triggers to the chest pain, noting that it occurs at rest or while ambulating.  At this time she states that the chest feels tight, not painful.  Denies nausea, vomiting, diaphoresis, lightheadedness, cough, fever, abdominal pain, numbness, weakness.  She denies pleuritic chest pain, history of DVT/PE, exogenous estrogen use, recent surgery or immobilization.  States that she has had left leg swelling for years now, no calf tenderness.  She denies tobacco use.  Denies family history of MI.  Denies history of exertional chest pain.  Per chart review patient had echo 01/2013 which showed mildly increased left ventricular septal wall thickness.  She reports that she had a cardiac catheterization several years ago in [place] which was normal.  No record of cardiac cath in the EMR.  Past Medical History:  Diagnosis Date  . Cardiomyopathy ([place])   . Kidney stones   . Migraine   . Morton's neuroma    [place]  . Pulmonary embolism [facility])     Patient Active Problem List   Diagnosis Date Noted  . Acute bronchitis  03/15/2016  . Dyspnea 07/25/2015  . 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  . GERD (gastroesophageal reflux disease) 07/25/2015  . Obesity 07/25/2015  . Chest wall pain 07/25/2015  . 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 ABDOMINAL HYSTERECTOMY    . CARDIAC SURGERY  04/2014   Catherization   . CHOLECYSTECTOMY    . LITHOTRIPSY     x5  . TONSILLECTOMY  1969    OB History    No data available       Home Medications    Prior to Admission medications   Medication Sig Start Date End Date Taking? Authorizing Provider  diclofenac (VOLTAREN) 75 MG EC tablet Take 1 tablet (75 mg total) by mouth 2 (two) times daily. 06/08/17   Wallene Huh, DPM  omeprazole (PRILOSEC) 20 MG capsule Take 20 mg by mouth daily.    [provider]  pseudoephedrine (SUDAFED) 120 MG 12 hr tablet Take 120 mg by mouth daily.    [provider]    Family History Family History  Problem Relation Age of Onset  . Breast cancer Mother   . Stroke Father   . Heart Problems Father        heart bypass  . Alzheimer's disease Paternal Aunt   . Alzheimer's disease Paternal Uncle   . Heart Problems Paternal Grandmother        enlarged heart  . Alzheimer's disease Paternal Aunt   .  Alzheimer's disease Paternal Aunt     Social History Social History   Tobacco Use  . Smoking status: Former Smoker    Packs/day: 0.20    Years: 2.00    Pack years: 0.40    Types: Cigarettes    Last attempt to quit: 07/24/1986    Years since quitting: 31.1  . Smokeless tobacco: Never Used  . Tobacco comment: social smoker  Substance Use Topics  . Alcohol use: Yes    Alcohol/week: 0.0 oz    Comment: 1 drink on New Year's Eve  . Drug use: No     Allergies   Contrast media [iodinated diagnostic agents] and Fish allergy   Review of Systems Review of Systems  Constitutional: Negative for chills, fatigue and fever.  Eyes: Negative for visual  disturbance.  Respiratory: Positive for shortness of breath. Negative for cough and wheezing.   Cardiovascular: Positive for chest pain and leg swelling (chronic left leg).  Gastrointestinal: Negative for abdominal pain, diarrhea, nausea and vomiting.  Genitourinary: Negative for difficulty urinating.  Musculoskeletal: Positive for back pain. Negative for gait problem.  Skin: Negative for rash.  Neurological: Negative for dizziness, weakness, light-headedness, numbness and headaches.  Psychiatric/Behavioral: Negative for agitation.     Physical Exam Updated Vital Signs BP 129/76 (BP Location: Right Arm)   Pulse 85   Temp 98.1 F (36.7 C) (Oral)   Resp 18   Ht 5\' 5"  (1.651 m)   Wt 103 kg (227 lb)   SpO2 99%   BMI 37.77 kg/m   Physical Exam  Constitutional: She is oriented to person, place, and time. She appears well-developed and well-nourished. No distress.  HENT:  Head: Normocephalic and atraumatic.  Eyes: Pupils are equal, round, and reactive to light. Right eye exhibits no discharge. Left eye exhibits no discharge.  Neck: Normal range of motion. Neck supple. No JVD present. No tracheal deviation present.  Cardiovascular: Normal rate and regular rhythm. Exam reveals no friction rub.  No murmur heard. Pulses:      Radial pulses are 2+ on the right side, and 2+ on the left side.       Dorsalis pedis pulses are 2+ on the right side, and 2+ on the left side.  Pulmonary/Chest: Effort normal and breath sounds normal. No accessory muscle usage or stridor. No tachypnea. No respiratory distress. She has no wheezes. She has no rhonchi. She has no rales.  Tender to palpation over the left chest wall. No overlying bruising or erythema.   Abdominal: Soft. Bowel sounds are normal. She exhibits no distension and no mass. There is no tenderness. There is no guarding.  Musculoskeletal: Normal range of motion.       Right lower leg: Normal. She exhibits no tenderness and no edema.  Left  lower leg with 1+ pitting edema up to the mid shin. No calf tenderness or erythema.   Lymphadenopathy:    She has no cervical adenopathy.  Neurological: She is alert and oriented to person, place, and time. Coordination normal.  Skin: Skin is warm and dry. Capillary refill takes less than 2 seconds. She is not diaphoretic.  Psychiatric: She has a normal mood and affect. Her behavior is normal.  Nursing note and vitals reviewed.    ED Treatments / Results  Labs (all labs ordered are listed, but only abnormal results are displayed) Labs Reviewed  BASIC METABOLIC PANEL - Abnormal; Notable for the following components:      Result Value   Glucose, Bld 109 (*)  All other components within normal limits  CBC  I-STAT TROPONIN, ED  I-STAT BETA HCG BLOOD, ED ([facility], [facility], [facility] ONLY)    EKG  EKG Interpretation None       Radiology No results found.  Procedures Procedures (including critical care time)  Medications Ordered in ED Medications - No data to display   Initial Impression / Assessment and Plan / ED Course  I have reviewed the triage vital signs and the nursing notes.  Pertinent labs & imaging results that were available during my care of the patient were reviewed by me and considered in my medical decision making (see chart for details).  Clinical Course as of Sep 23 1521  Fri Sep 23, 2017  1436 On recheck, patient denies recent episodes of sharp chest pain. Has a headache, asking for tylenol.    [ES]    Clinical Course User Index [ES] Glyn Ade, PA-C   Patient is to be discharged with recommendation to follow up with PCP in regards to today's hospital visit. Have given her the information to follow up with [facility]. Chest pain is not likely of cardiac or pulmonary etiology d/t VSS, no tracheal deviation, no JVD or new murmur, RRR, breath sounds equal bilaterally, EKG without acute abnormalities, negative delta troponin, and negative CXR. Do not suspect PE  given patient has no pleuritic chest pain, no tachycardia or tachypnea, no exogenous estrogen, denies history of PE/DVT, no recent surgery or immobilization. She does have left leg swelling, but per patient this has been ongoing for years and no calf tenderness or erythema on exam. Pt has been advised to return to the ED if CP becomes exertional, associated with diaphoresis or nausea, radiates to left jaw/arm, worsens or becomes concerning in any way. Her blood glucose was mildly elevated (109), have counseled her to have this rechecked with PCP and patient agrees and voices understanding to the plan. Pt appears reliable for follow up and is agreeable to discharge.   Case has been discussed with Dr. Tyrone Nine who agrees with the above plan to discharge.   Final Clinical Impressions(s) / ED Diagnoses   Final diagnoses:  Chest pain, unspecified type    ED Discharge Orders    None       Glyn Ade, PA-C 09/23/17 1522    Glyn Ade, PA-C 09/23/17 [place], DO 09/23/17 1600

## 2017-11-09 DIAGNOSIS — R739 Hyperglycemia, unspecified: Secondary | ICD-10-CM | POA: Insufficient documentation

## 2017-11-10 DIAGNOSIS — R7303 Prediabetes: Secondary | ICD-10-CM | POA: Insufficient documentation

## 2018-02-09 DIAGNOSIS — Z800 Family history of malignant neoplasm of digestive organs: Secondary | ICD-10-CM | POA: Insufficient documentation

## 2018-02-09 DIAGNOSIS — Z8371 Family history of colonic polyps: Secondary | ICD-10-CM | POA: Insufficient documentation

## 2018-06-01 DIAGNOSIS — I10 Essential (primary) hypertension: Secondary | ICD-10-CM | POA: Insufficient documentation

## 2018-10-09 ENCOUNTER — Encounter (HOSPITAL_BASED_OUTPATIENT_CLINIC_OR_DEPARTMENT_OTHER): Payer: Self-pay | Admitting: *Deleted

## 2018-10-09 ENCOUNTER — Observation Stay (HOSPITAL_BASED_OUTPATIENT_CLINIC_OR_DEPARTMENT_OTHER)
Admission: EM | Admit: 2018-10-09 | Discharge: 2018-10-10 | Disposition: A | Discharge status: Home / Self Care | Payer: No Typology Code available for payment source | Attending: Urology | Admitting: Urology

## 2018-10-09 ENCOUNTER — Emergency Department (HOSPITAL_BASED_OUTPATIENT_CLINIC_OR_DEPARTMENT_OTHER): Payer: No Typology Code available for payment source

## 2018-10-09 ENCOUNTER — Other Ambulatory Visit: Payer: Self-pay

## 2018-10-09 DIAGNOSIS — Z91041 Radiographic dye allergy status: Secondary | ICD-10-CM | POA: Diagnosis not present

## 2018-10-09 DIAGNOSIS — G43909 Migraine, unspecified, not intractable, without status migrainosus: Secondary | ICD-10-CM | POA: Diagnosis not present

## 2018-10-09 DIAGNOSIS — R109 Unspecified abdominal pain: Secondary | ICD-10-CM

## 2018-10-09 DIAGNOSIS — Z79899 Other long term (current) drug therapy: Secondary | ICD-10-CM | POA: Diagnosis not present

## 2018-10-09 DIAGNOSIS — Z1611 Resistance to penicillins: Secondary | ICD-10-CM | POA: Insufficient documentation

## 2018-10-09 DIAGNOSIS — B9620 Unspecified Escherichia coli [E. coli] as the cause of diseases classified elsewhere: Secondary | ICD-10-CM | POA: Insufficient documentation

## 2018-10-09 DIAGNOSIS — Z1623 Resistance to quinolones and fluoroquinolones: Secondary | ICD-10-CM | POA: Diagnosis not present

## 2018-10-09 DIAGNOSIS — Z9049 Acquired absence of other specified parts of digestive tract: Secondary | ICD-10-CM | POA: Insufficient documentation

## 2018-10-09 DIAGNOSIS — I429 Cardiomyopathy, unspecified: Secondary | ICD-10-CM | POA: Diagnosis not present

## 2018-10-09 DIAGNOSIS — N132 Hydronephrosis with renal and ureteral calculous obstruction: Secondary | ICD-10-CM

## 2018-10-09 DIAGNOSIS — N136 Pyonephrosis: Principal | ICD-10-CM | POA: Insufficient documentation

## 2018-10-09 DIAGNOSIS — Z86711 Personal history of pulmonary embolism: Secondary | ICD-10-CM | POA: Insufficient documentation

## 2018-10-09 DIAGNOSIS — Z87891 Personal history of nicotine dependence: Secondary | ICD-10-CM | POA: Insufficient documentation

## 2018-10-09 DIAGNOSIS — Z1619 Resistance to other specified beta lactam antibiotics: Secondary | ICD-10-CM | POA: Diagnosis not present

## 2018-10-09 DIAGNOSIS — Z91013 Allergy to seafood: Secondary | ICD-10-CM | POA: Diagnosis not present

## 2018-10-09 DIAGNOSIS — N3001 Acute cystitis with hematuria: Secondary | ICD-10-CM | POA: Diagnosis present

## 2018-10-09 DIAGNOSIS — Z87442 Personal history of urinary calculi: Secondary | ICD-10-CM | POA: Diagnosis not present

## 2018-10-09 DIAGNOSIS — N200 Calculus of kidney: Secondary | ICD-10-CM

## 2018-10-09 DIAGNOSIS — Z90710 Acquired absence of both cervix and uterus: Secondary | ICD-10-CM | POA: Insufficient documentation

## 2018-10-09 LAB — BASIC METABOLIC PANEL
ANION GAP: 7 (ref 5–15)
BUN: 19 mg/dL (ref 6–20)
CALCIUM: 9.3 mg/dL (ref 8.9–10.3)
CO2: 27 mmol/L (ref 22–32)
Chloride: 104 mmol/L (ref 98–111)
Creatinine, Ser: 0.93 mg/dL (ref 0.44–1.00)
Glucose, Bld: 111 mg/dL — ABNORMAL HIGH (ref 70–99)
POTASSIUM: 3.3 mmol/L — AB (ref 3.5–5.1)
SODIUM: 138 mmol/L (ref 135–145)

## 2018-10-09 LAB — URINALYSIS, ROUTINE W REFLEX MICROSCOPIC
Bilirubin Urine: NEGATIVE
GLUCOSE, UA: NEGATIVE mg/dL
Ketones, ur: NEGATIVE mg/dL
Nitrite: POSITIVE — AB
PH: 6 (ref 5.0–8.0)
PROTEIN: 100 mg/dL — AB
Specific Gravity, Urine: 1.02 (ref 1.005–1.030)

## 2018-10-09 LAB — CBC WITH DIFFERENTIAL/PLATELET
ABS IMMATURE GRANULOCYTES: 0.04 10*3/uL (ref 0.00–0.07)
BASOS ABS: 0 10*3/uL (ref 0.0–0.1)
BASOS PCT: 0 %
EOS ABS: 0.2 10*3/uL (ref 0.0–0.5)
Eosinophils Relative: 2 %
HCT: 40.5 % (ref 36.0–46.0)
Hemoglobin: 12.9 g/dL (ref 12.0–15.0)
IMMATURE GRANULOCYTES: 0 %
Lymphocytes Relative: 20 %
Lymphs Abs: 1.9 10*3/uL (ref 0.7–4.0)
MCH: 27.7 pg (ref 26.0–34.0)
MCHC: 31.9 g/dL (ref 30.0–36.0)
MCV: 87.1 fL (ref 80.0–100.0)
MONOS PCT: 5 %
Monocytes Absolute: 0.5 10*3/uL (ref 0.1–1.0)
NEUTROS PCT: 73 %
NRBC: 0 % (ref 0.0–0.2)
Neutro Abs: 6.8 10*3/uL (ref 1.7–7.7)
PLATELETS: 263 10*3/uL (ref 150–400)
RBC: 4.65 MIL/uL (ref 3.87–5.11)
RDW: 12.6 % (ref 11.5–15.5)
WBC: 9.4 10*3/uL (ref 4.0–10.5)

## 2018-10-09 LAB — I-STAT CG4 LACTIC ACID, ED: LACTIC ACID, VENOUS: 1.28 mmol/L (ref 0.5–1.9)

## 2018-10-09 LAB — URINALYSIS, MICROSCOPIC (REFLEX): WBC, UA: >50 WBC/hpf (ref 0–5)

## 2018-10-09 MED ORDER — SODIUM CHLORIDE 0.9 % IV SOLN
2.0000 g | Freq: Once | INTRAVENOUS | Status: AC
  Administered 2018-10-09: 2 g via INTRAVENOUS
  Filled 2018-10-09: qty 20

## 2018-10-09 MED ORDER — MORPHINE SULFATE (PF) 4 MG/ML IV SOLN
4.0000 mg | Freq: Once | INTRAVENOUS | Status: AC
  Administered 2018-10-09: 4 mg via INTRAVENOUS
  Filled 2018-10-09: qty 1

## 2018-10-09 MED ORDER — SODIUM CHLORIDE 0.9 % IV BOLUS
1000.0000 mL | Freq: Once | INTRAVENOUS | Status: AC
  Administered 2018-10-09: 1000 mL via INTRAVENOUS

## 2018-10-09 MED ORDER — ONDANSETRON HCL 4 MG/2ML IJ SOLN
4.0000 mg | Freq: Once | INTRAMUSCULAR | Status: AC
  Administered 2018-10-09: 4 mg via INTRAVENOUS
  Filled 2018-10-09: qty 2

## 2018-10-09 NOTE — ED Triage Notes (Signed)
Hx of kidney stone for the past month. she has a 50mm left kidney. She was recently treated for a bacterial infection with an antibiotic that she completed 10 days ago. She has an appointment with a urologist January 6th. She is here today with back pain and headache.

## 2018-10-09 NOTE — ED Provider Notes (Signed)
Ashley Mcconnell is a 57 y.o. female, presenting to the ED with left flank pain since early December, worse over the last week.   HPI from Carmon Sails, PA-C: "Ashley Mcconnell is a 57 y.o. female with h/o recurrent kidney stones requiring lithotripsy and retrieval, is here for evaluation of left low back/flank pain. States she has a known 9 mm stone on left side diagnosed by CT on December 2nd.  She has had left flank pain since December 2nd but this acutely worsened 1 week ago.  Has been on flomax and finished bactrim 9 days ago.  Pain radiates into LLQ and groin, severe.  Associated with chills, body aches, rigors, dark and "bubbly urine", urinary urgency.  Has had recurrent UTI four times in the last 4 months and has been on 4 different antibiotics.  Compliant with flomax. No other interventions PTA. No alleviating or aggravating factors.   She denies nausea, vomiting, dysuria, urinary frequency, diarrhea."    Physical Exam  BP (!) 145/75 (BP Location: Right Arm)   Pulse (!) 108   Temp 98.3 F (36.8 C) (Oral)   Resp 16   Ht 5\' 5"  (1.651 m)   Wt 99.8 kg   SpO2 99%   BMI 36.61 kg/m   Physical Exam Vitals signs and nursing note reviewed.  Constitutional:      General: She is not in acute distress.    Appearance: She is well-developed. She is not diaphoretic.  HENT:     Head: Normocephalic and atraumatic.  Eyes:     Conjunctiva/sclera: Conjunctivae normal.  Neck:     Musculoskeletal: Neck supple.  Cardiovascular:     Rate and Rhythm: Normal rate and regular rhythm.  Pulmonary:     Effort: Pulmonary effort is normal. No respiratory distress.  Abdominal:     Palpations: Abdomen is soft.     Tenderness: There is abdominal tenderness. There is no guarding.     Comments: Tender left abdomen and flank.  Lymphadenopathy:     Cervical: No cervical adenopathy.  Skin:    General: Skin is warm and dry.  Neurological:     Mental Status: She is alert.  Psychiatric:        Behavior:  Behavior normal.     ED Course/Procedures     Procedures   Abnormal Labs Reviewed  URINALYSIS, ROUTINE W REFLEX MICROSCOPIC - Abnormal; Notable for the following components:      Result Value   APPearance CLOUDY (*)    Hgb urine dipstick TRACE (*)    Protein, ur 100 (*)    Nitrite POSITIVE (*)    Leukocytes, UA MODERATE (*)    All other components within normal limits  URINALYSIS, MICROSCOPIC (REFLEX) - Abnormal; Notable for the following components:   Bacteria, UA MANY (*)    All other components within normal limits  BASIC METABOLIC PANEL - Abnormal; Notable for the following components:   Potassium 3.3 (*)    Glucose, Bld 111 (*)    All other components within normal limits   Ct Renal Stone Study  Result Date: 10/09/2018 CLINICAL DATA:  Left flank tenderness EXAM: CT ABDOMEN AND PELVIS WITHOUT CONTRAST TECHNIQUE: Multidetector CT imaging of the abdomen and pelvis was performed following the standard protocol without IV contrast. COMPARISON:  02/28/2014 FINDINGS: Lower chest: Lung bases are clear. No effusions. Heart is normal size. Hepatobiliary: No focal hepatic abnormality. Gallbladder unremarkable. Pancreas: No focal abnormality or ductal dilatation. Spleen: No focal abnormality.  Normal size. Adrenals/Urinary Tract: Adrenal  glands unremarkable. There is moderate left hydronephrosis due to a 7-8 mm distal left ureteral stone several cm above the left ureterovesical junction. Bilateral punctate nephrolithiasis. No hydronephrosis on the right. Urinary bladder unremarkable. Stomach/Bowel: Normal appendix. Stomach, large and small bowel grossly unremarkable. Vascular/Lymphatic: No evidence of aneurysm or adenopathy. Reproductive: Prior hysterectomy.  No adnexal masses. Other: No free fluid or free air. Bilateral inguinal hernias containing fat. Musculoskeletal: No acute bony abnormality. IMPRESSION: 7-8 mm distal left ureteral stone with moderate left hydronephrosis. Bilateral  punctate nephrolithiasis. Bilateral inguinal hernias containing fat. Electronically Signed   By: Rolm Baptise M.D.   On: 10/09/2018 20:35    MDM   Clinical Course as of Oct 09 2229  Mon Oct 09, 2018  2106 Patient states her pain is currently 4/10.   [SJ]  2114 Spoke with Case Clydene Laming, urology resident.  Patient would need to come to [facility] for urologic evaluation.    [SJ]  2120 Spoke with the patient about transfer to [facility].  She is resistant to this plan. We discussed in depth my concerns with her going home from the ED. She eventually agreed to allow the transfer.   [SJ]  2147 Spoke with Dr. Clydene Laming to let him know we are going to transfer the patient to [facility] ED.    [SJ]  2148 Spoke with Dr. Venora Maples, [place] at [facility]. Agrees to accept the patient.   [SJ]    Clinical Course User Index [SJ] Layla Maw   Took patient care handoff report from Carmon Sails, [place]. Plan: Review CT and disposition accordingly.  Patient has left flank and left abdominal pain.  Evidence of UTI on UA.  Patient has had several antibiotics over the past few weeks, including Cipro, Macrobid, amoxicillin, and, most recently, Bactrim.  Left distal ureter stone noted on CT accompanied by moderate hydronephrosis.  Presence of stone and urinary infection, as well as failure of outpatient therapy, warrants further evaluation by urology. Patient transferred to [facility] ED.  ED provider at [facility] should notify urology that the patient has arrived.  Findings and plan of care discussed with Theotis Burrow, MD.   Vitals:   10/09/18 1553 10/09/18 1558 10/09/18 1740 10/09/18 2113  BP:  130/86 (!) 145/75 115/62  Pulse:  (!) 105 (!) 108 88  Resp:  20 16 18   Temp:  98.3 F (36.8 C)  98 F (36.7 C)  TempSrc:  Oral  Oral  SpO2:  97% 99% 98%  Weight: 99.8 kg     Height: 5\' 5"  (1.651 m)          Lorayne Bender, PA-C 10/09/18 2230    Little, Wenda Overland, MD 10/13/18 1409

## 2018-10-09 NOTE — ED Provider Notes (Addendum)
[facility] EMERGENCY DEPARTMENT Provider Note   CSN: [id number] Arrival date & time: 10/09/18  1533     History   Chief Complaint No chief complaint on file.   HPI Ashley Mcconnell is a 57 y.o. female with h/o recurrent kidney stones requiring lithotripsy and retrieval, is here for evaluation of left low back/flank pain. States she has a known 9 mm stone on left side diagnosed by CT on December 2nd.  She has had left flank pain since December 2nd but this acutely worsened 1 week ago.  Has been on flomax and finished bactrim 9 days ago.  Pain radiates into LLQ and groin, severe.  Associated with chills, body aches, rigors, dark and "bubbly urine", urinary urgency.  Has had recurrent UTI four times in the last 4 months and has been on 4 different antibiotics.  Compliant with flomax. No other interventions PTA. No alleviating or aggravating factors.   She denies nausea, vomiting, dysuria, urinary frequency, diarrhea.   HPI  Past Medical History:  Diagnosis Date  . Cardiomyopathy ([place])   . Kidney stones   . Migraine   . Morton's neuroma    L Foot  . Pulmonary embolism [facility])     Patient Active Problem List   Diagnosis Date Noted  . Acute bronchitis 03/15/2016  . Dyspnea 07/25/2015  . 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  . GERD (gastroesophageal reflux disease) 07/25/2015  . Obesity 07/25/2015  . Chest wall pain 07/25/2015  . 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 ABDOMINAL HYSTERECTOMY    . CARDIAC SURGERY  04/2014   Catherization   . CHOLECYSTECTOMY    . LITHOTRIPSY     x5  . TONSILLECTOMY  1969     OB History   No obstetric history on file.      Home Medications    Prior to Admission medications   Medication Sig Start Date End Date Taking? Authorizing Provider  cholecalciferol (VITAMIN D) 1000 units tablet Take 2,000 Units by mouth daily.   Yes [provider]  DICLOFENAC PO  Take by mouth.   Yes [provider]  HYDROCHLOROTHIAZIDE PO Take by mouth.   Yes [provider]  omeprazole (PRILOSEC) 20 MG capsule Take 20 mg by mouth daily.   Yes [provider]  tamsulosin (FLOMAX) 0.4 MG CAPS capsule Take 0.4 mg by mouth.   Yes [provider]  vitamin C (ASCORBIC ACID) 500 MG tablet Take 500 mg by mouth daily.   Yes [provider]  diclofenac (VOLTAREN) 75 MG EC tablet Take 1 tablet (75 mg total) by mouth 2 (two) times daily. 06/08/17   Wallene Huh, DPM    Family History Family History  Problem Relation Age of Onset  . Breast cancer Mother   . Stroke Father   . Heart Problems Father        heart bypass  . Alzheimer's disease Paternal Aunt   . Alzheimer's disease Paternal Uncle   . Heart Problems Paternal Grandmother        enlarged heart  . Alzheimer's disease Paternal Aunt   . Alzheimer's disease Paternal Aunt     Social History Social History   Tobacco Use  . Smoking status: Former Smoker    Packs/day: 0.20    Years: 2.00    Pack years: 0.40    Types: Cigarettes    Last attempt to quit: 07/24/1986    Years since quitting: 32.2  .  Smokeless tobacco: Never Used  . Tobacco comment: social smoker  Substance Use Topics  . Alcohol use: Yes    Alcohol/week: 0.0 standard drinks    Comment: 1 drink on New Year's Eve  . Drug use: No     Allergies   Contrast media [iodinated diagnostic agents] and Fish allergy   Review of Systems Review of Systems  Constitutional: Positive for chills.  Gastrointestinal: Positive for abdominal pain.  Genitourinary: Positive for flank pain and frequency.       Dark urine   Musculoskeletal: Positive for myalgias.  All other systems reviewed and are negative.    Physical Exam Updated Vital Signs BP 115/62 (BP Location: Left Arm)   Pulse 88   Temp 98 F (36.7 C) (Oral)   Resp 18   Ht 5\' 5"  (1.651 m)   Wt 99.8 kg   SpO2 98%   BMI 36.61 kg/m   Physical  Exam Vitals signs and nursing note reviewed.  Constitutional:      Appearance: She is well-developed.     Comments: Non toxic  HENT:     Head: Normocephalic and atraumatic.     Nose: Nose normal.  Eyes:     Conjunctiva/sclera: Conjunctivae normal.     Pupils: Pupils are equal, round, and reactive to light.  Neck:     Musculoskeletal: Normal range of motion.  Cardiovascular:     Rate and Rhythm: Normal rate and regular rhythm.     Comments: 1+ radial and DP pulses bilaterally  Pulmonary:     Effort: Pulmonary effort is normal.     Breath sounds: Normal breath sounds.  Abdominal:     General: Bowel sounds are normal.     Palpations: Abdomen is soft.     Tenderness: There is abdominal tenderness.     Comments: Left CVAT. Left mid abdominal tenderness. No G/R/R. Negative Murphy's and McBurney's.  Musculoskeletal: Normal range of motion.  Skin:    General: Skin is warm and dry.     Capillary Refill: Capillary refill takes less than 2 seconds.     Comments: No rash to back/flank  Neurological:     Mental Status: She is alert and oriented to person, place, and time.  Psychiatric:        Behavior: Behavior normal.      ED Treatments / Results  Labs (all labs ordered are listed, but only abnormal results are displayed) Labs Reviewed  URINALYSIS, ROUTINE W REFLEX MICROSCOPIC - Abnormal; Notable for the following components:      Result Value   APPearance CLOUDY (*)    Hgb urine dipstick TRACE (*)    Protein, ur 100 (*)    Nitrite POSITIVE (*)    Leukocytes, UA MODERATE (*)    All other components within normal limits  URINALYSIS, MICROSCOPIC (REFLEX) - Abnormal; Notable for the following components:   Bacteria, UA MANY (*)    All other components within normal limits  BASIC METABOLIC PANEL - Abnormal; Notable for the following components:   Potassium 3.3 (*)    Glucose, Bld 111 (*)    All other components within normal limits  URINE CULTURE  CBC WITH  DIFFERENTIAL/PLATELET  I-STAT CG4 LACTIC ACID, ED  I-STAT CG4 LACTIC ACID, ED    EKG None  Radiology Ct Renal Stone Study  Result Date: 10/09/2018 CLINICAL DATA:  Left flank tenderness EXAM: CT ABDOMEN AND PELVIS WITHOUT CONTRAST TECHNIQUE: Multidetector CT imaging of the abdomen and pelvis was performed following the standard  protocol without IV contrast. COMPARISON:  02/28/2014 FINDINGS: Lower chest: Lung bases are clear. No effusions. Heart is normal size. Hepatobiliary: No focal hepatic abnormality. Gallbladder unremarkable. Pancreas: No focal abnormality or ductal dilatation. Spleen: No focal abnormality.  Normal size. Adrenals/Urinary Tract: Adrenal glands unremarkable. There is moderate left hydronephrosis due to a 7-8 mm distal left ureteral stone several cm above the left ureterovesical junction. Bilateral punctate nephrolithiasis. No hydronephrosis on the right. Urinary bladder unremarkable. Stomach/Bowel: Normal appendix. Stomach, large and small bowel grossly unremarkable. Vascular/Lymphatic: No evidence of aneurysm or adenopathy. Reproductive: Prior hysterectomy.  No adnexal masses. Other: No free fluid or free air. Bilateral inguinal hernias containing fat. Musculoskeletal: No acute bony abnormality. IMPRESSION: 7-8 mm distal left ureteral stone with moderate left hydronephrosis. Bilateral punctate nephrolithiasis. Bilateral inguinal hernias containing fat. Electronically Signed   By: Rolm Baptise M.D.   On: 10/09/2018 20:35    Procedures Procedures (including critical care time)  Medications Ordered in ED Medications  sodium chloride 0.9 % bolus 1,000 mL (0 mLs Intravenous Stopped 10/09/18 2029)  morphine 4 MG/ML injection 4 mg (4 mg Intravenous Given 10/09/18 1924)  cefTRIAXone (ROCEPHIN) 2 g in sodium chloride 0.9 % 100 mL IVPB (0 g Intravenous Stopped 10/09/18 2029)     Initial Impression / Assessment and Plan / ED Course  I have reviewed the triage vital signs and the  nursing notes.  Pertinent labs & imaging results that were available during my care of the patient were reviewed by me and considered in my medical decision making (see chart for details).  Clinical Course as of Oct 09 2244  Mon Oct 09, 2018  2106 Patient states her pain is currently 4/10.   [SJ]  2114 Spoke with Case Clydene Laming, urology resident.  Patient would need to come to [facility] for urologic evaluation.    [SJ]  2120 Spoke with the patient about transfer to [facility].  She is resistant to this plan. We discussed in depth my concerns with her going home from the ED. She eventually agreed to allow the transfer.   [SJ]  2147 Spoke with Dr. Clydene Laming to let him know we are going to transfer the patient to [facility] ED.    [SJ]  2148 Spoke with Dr. Venora Maples, [place] at [facility]. Agrees to accept the patient.   [SJ]    Clinical Course User Index [SJ] Joy, Shawn C, PA-C    ddx includes pyelonephritis/UTI, in setting of known large stone also concern for post renal obstruction, AKI, that may require urology intervention.  She has no n/v/d, and I doubt other intraabd process such as diverticulitis, appendicitis, cholecystitis, dissection. Pending labs.   2019: Positive nitrite UTI. No leukocytosis. Creatinine normal.  Pt reports chills, rigors and is mildly tachycardic suggesting possible early SIRS response. Pending CT renal.  Pt will be handed off to oncoming EDPA who will f/u on CT.  Consider urology consult.  If reassuring CT, clinical improvement, consider discharge. Pt has urology f/u on January 6th with Dr Estill Dooms [facility]).   Final Clinical Impressions(s) / ED Diagnoses   Final diagnoses:  Flank pain  Ureteral stone with hydronephrosis  Acute cystitis with hematuria    ED Discharge Orders    None       Kinnie Feil, PA-C 10/09/18 2027    Kinnie Feil, PA-C 10/09/18 2245    Little, Wenda Overland, MD 10/15/18 1245

## 2018-10-10 ENCOUNTER — Inpatient Hospital Stay (HOSPITAL_COMMUNITY): Payer: No Typology Code available for payment source | Admitting: Anesthesiology

## 2018-10-10 ENCOUNTER — Encounter (HOSPITAL_COMMUNITY): Payer: Self-pay | Admitting: Anesthesiology

## 2018-10-10 ENCOUNTER — Encounter (HOSPITAL_COMMUNITY)
Admission: EM | Disposition: A | Discharge status: Home / Self Care | Payer: Self-pay | Source: Home / Self Care | Attending: Emergency Medicine

## 2018-10-10 ENCOUNTER — Inpatient Hospital Stay (HOSPITAL_COMMUNITY): Payer: No Typology Code available for payment source

## 2018-10-10 DIAGNOSIS — N200 Calculus of kidney: Secondary | ICD-10-CM

## 2018-10-10 HISTORY — PX: CYSTOSCOPY/URETEROSCOPY/HOLMIUM LASER/STENT PLACEMENT: SHX6546

## 2018-10-10 LAB — CBC
HCT: 36.4 % (ref 36.0–46.0)
Hemoglobin: 11.4 g/dL — ABNORMAL LOW (ref 12.0–15.0)
MCH: 27.9 pg (ref 26.0–34.0)
MCHC: 31.3 g/dL (ref 30.0–36.0)
MCV: 89.2 fL (ref 80.0–100.0)
Platelets: 202 10*3/uL (ref 150–400)
RBC: 4.08 MIL/uL (ref 3.87–5.11)
RDW: 12.5 % (ref 11.5–15.5)
WBC: 6 10*3/uL (ref 4.0–10.5)
nRBC: 0 % (ref 0.0–0.2)

## 2018-10-10 LAB — BASIC METABOLIC PANEL
Anion gap: 7 (ref 5–15)
BUN: 14 mg/dL (ref 6–20)
CO2: 28 mmol/L (ref 22–32)
Calcium: 8.4 mg/dL — ABNORMAL LOW (ref 8.9–10.3)
Chloride: 106 mmol/L (ref 98–111)
Creatinine, Ser: 0.92 mg/dL (ref 0.44–1.00)
GFR calc Af Amer: >60 mL/min (ref >60)
GFR calc non Af Amer: >60 mL/min (ref >60)
Glucose, Bld: 141 mg/dL — ABNORMAL HIGH (ref 70–99)
Potassium: 3.9 mmol/L (ref 3.5–5.1)
Sodium: 141 mmol/L (ref 135–145)

## 2018-10-10 LAB — HIV ANTIBODY (ROUTINE TESTING W REFLEX): HIV Screen 4th Generation wRfx: NONREACTIVE

## 2018-10-10 SURGERY — CYSTOSCOPY/URETEROSCOPY/HOLMIUM LASER/STENT PLACEMENT
Anesthesia: General | Laterality: Left

## 2018-10-10 MED ORDER — PROPOFOL 10 MG/ML IV BOLUS
INTRAVENOUS | Status: DC | PRN
  Administered 2018-10-10: 160 mg via INTRAVENOUS

## 2018-10-10 MED ORDER — DEXAMETHASONE SODIUM PHOSPHATE 10 MG/ML IJ SOLN
INTRAMUSCULAR | Status: DC | PRN
  Administered 2018-10-10: 10 mg via INTRAVENOUS

## 2018-10-10 MED ORDER — MIDAZOLAM HCL 5 MG/5ML IJ SOLN
INTRAMUSCULAR | Status: DC | PRN
  Administered 2018-10-10: 2 mg via INTRAVENOUS

## 2018-10-10 MED ORDER — FENTANYL CITRATE (PF) 100 MCG/2ML IJ SOLN
INTRAMUSCULAR | Status: DC | PRN
  Administered 2018-10-10 (×2): 50 ug via INTRAVENOUS

## 2018-10-10 MED ORDER — OXYBUTYNIN CHLORIDE 5 MG PO TABS: 5.0000 mg | ORAL_TABLET | Freq: Three times a day (TID) | ORAL | 0 refills | Status: DC | PRN

## 2018-10-10 MED ORDER — FENTANYL CITRATE (PF) 100 MCG/2ML IJ SOLN
INTRAMUSCULAR | Status: AC
  Filled 2018-10-10: qty 2

## 2018-10-10 MED ORDER — DEXTROSE-NACL 5-0.45 % IV SOLN
INTRAVENOUS | Status: DC
  Administered 2018-10-10: 02:00:00 via INTRAVENOUS

## 2018-10-10 MED ORDER — LIDOCAINE 2% (20 MG/ML) 5 ML SYRINGE
INTRAMUSCULAR | Status: AC
  Filled 2018-10-10: qty 5

## 2018-10-10 MED ORDER — HYDROCHLOROTHIAZIDE 12.5 MG PO CAPS: 12.5000 mg | ORAL_CAPSULE | Freq: Every day | ORAL | Status: DC

## 2018-10-10 MED ORDER — TAMSULOSIN HCL 0.4 MG PO CAPS: 0.4000 mg | ORAL_CAPSULE | Freq: Every day | ORAL | 0 refills | Status: DC

## 2018-10-10 MED ORDER — MORPHINE SULFATE (PF) 2 MG/ML IV SOLN
2.0000 mg | INTRAVENOUS | Status: DC | PRN
  Administered 2018-10-10 (×2): 2 mg via INTRAVENOUS
  Filled 2018-10-10 (×2): qty 1

## 2018-10-10 MED ORDER — ONDANSETRON HCL 4 MG/2ML IJ SOLN
4.0000 mg | INTRAMUSCULAR | Status: DC | PRN
  Administered 2018-10-10: 4 mg via INTRAVENOUS

## 2018-10-10 MED ORDER — OXYCODONE HCL 5 MG/5ML PO SOLN
5.0000 mg | Freq: Once | ORAL | Status: DC | PRN
  Filled 2018-10-10: qty 5

## 2018-10-10 MED ORDER — SODIUM CHLORIDE 0.9 % IR SOLN
Status: DC | PRN
  Administered 2018-10-10: 3000 mL

## 2018-10-10 MED ORDER — LACTATED RINGERS IV SOLN
INTRAVENOUS | Status: DC | PRN
  Administered 2018-10-10: 10:00:00 via INTRAVENOUS

## 2018-10-10 MED ORDER — OXYCODONE HCL 5 MG PO TABS: 5.0000 mg | ORAL_TABLET | Freq: Once | ORAL | Status: DC | PRN

## 2018-10-10 MED ORDER — OXYCODONE-ACETAMINOPHEN 5-325 MG PO TABS: 1.0000 | ORAL_TABLET | ORAL | 0 refills | Status: AC | PRN

## 2018-10-10 MED ORDER — SODIUM CHLORIDE 0.9 % IV SOLN
1.0000 g | Freq: Three times a day (TID) | INTRAVENOUS | Status: DC
  Administered 2018-10-10 (×2): 1 g via INTRAVENOUS
  Filled 2018-10-10 (×3): qty 1

## 2018-10-10 MED ORDER — PROMETHAZINE HCL 25 MG/ML IJ SOLN: 6.2500 mg | INTRAMUSCULAR | Status: DC | PRN

## 2018-10-10 MED ORDER — LIDOCAINE HCL (CARDIAC) PF 100 MG/5ML IV SOSY
PREFILLED_SYRINGE | INTRAVENOUS | Status: DC | PRN
  Administered 2018-10-10: 80 mg via INTRAVENOUS

## 2018-10-10 MED ORDER — FENTANYL CITRATE (PF) 100 MCG/2ML IJ SOLN: 25.0000 ug | INTRAMUSCULAR | Status: DC | PRN

## 2018-10-10 MED ORDER — OXYCODONE HCL 5 MG PO TABS
5.0000 mg | ORAL_TABLET | ORAL | Status: DC | PRN
  Administered 2018-10-10: 5 mg via ORAL
  Filled 2018-10-10: qty 1

## 2018-10-10 MED ORDER — PROPOFOL 10 MG/ML IV BOLUS
INTRAVENOUS | Status: AC
  Filled 2018-10-10: qty 20

## 2018-10-10 MED ORDER — SODIUM CHLORIDE 0.9 % IV SOLN: 1.0000 g | INTRAVENOUS | Status: DC

## 2018-10-10 MED ORDER — ONDANSETRON 4 MG PO TBDP: 4.0000 mg | ORAL_TABLET | Freq: Three times a day (TID) | ORAL | 0 refills | Status: DC | PRN

## 2018-10-10 MED ORDER — SULFAMETHOXAZOLE-TRIMETHOPRIM 800-160 MG PO TABS: 1.0000 | ORAL_TABLET | Freq: Two times a day (BID) | ORAL | 0 refills | Status: AC

## 2018-10-10 MED ORDER — ACETAMINOPHEN 325 MG PO TABS: 650.0000 mg | ORAL_TABLET | ORAL | Status: DC | PRN

## 2018-10-10 MED ORDER — DOCUSATE SODIUM 100 MG PO CAPS: 100.0000 mg | ORAL_CAPSULE | Freq: Every day | ORAL | 0 refills | Status: AC | PRN

## 2018-10-10 MED ORDER — MIDAZOLAM HCL 2 MG/2ML IJ SOLN
INTRAMUSCULAR | Status: AC
  Filled 2018-10-10: qty 2

## 2018-10-10 SURGICAL SUPPLY — 19 items
BAG URO CATCHER STRL LF (MISCELLANEOUS) ×2 IMPLANT
BASKET ZERO TIP NITINOL 2.4FR (BASKET) ×2 IMPLANT
CATH URET 5FR 28IN OPEN ENDED (CATHETERS) ×2 IMPLANT
CLOTH BEACON ORANGE TIMEOUT ST (SAFETY) ×2 IMPLANT
COVER WAND RF STERILE (DRAPES) IMPLANT
EXTRACTOR STONE NITINOL NGAGE (UROLOGICAL SUPPLIES) IMPLANT
FIBER LASER FLEXIVA 365 (UROLOGICAL SUPPLIES) IMPLANT
FIBER LASER TRAC TIP (UROLOGICAL SUPPLIES) ×2 IMPLANT
GLOVE BIOGEL M STRL SZ7.5 (GLOVE) ×2 IMPLANT
GOWN STRL REUS W/TWL XL LVL3 (GOWN DISPOSABLE) ×4 IMPLANT
GUIDEWIRE STR DUAL SENSOR (WIRE) IMPLANT
GUIDEWIRE ZIPWRE .038 STRAIGHT (WIRE) ×2 IMPLANT
MANIFOLD NEPTUNE II (INSTRUMENTS) ×2 IMPLANT
PACK CYSTO (CUSTOM PROCEDURE TRAY) ×2 IMPLANT
SHEATH URETERAL 12FRX35CM (MISCELLANEOUS) IMPLANT
STENT URET 6FRX24 CONTOUR (STENTS) ×2 IMPLANT
STENT URET 6FRX26 CONTOUR (STENTS) IMPLANT
TUBING CONNECTING 10 (TUBING) ×2 IMPLANT
TUBING UROLOGY SET (TUBING) ×2 IMPLANT

## 2018-10-10 NOTE — ED Notes (Signed)
OR called and reported they will be doing the surgery on patient around 1300 today.

## 2018-10-10 NOTE — Discharge Instructions (Signed)
General Anesthesia, Adult, Care After  This sheet gives you information about how to care for yourself after your procedure. Your health care provider may also give you more specific instructions. If you have problems or questions, contact your health care provider.  What can I expect after the procedure?  After the procedure, the following side effects are common:  Pain or discomfort at the IV site.  Nausea.  Vomiting.  Sore throat.  Trouble concentrating.  Feeling cold or chills.  Weak or tired.  Sleepiness and fatigue.  Soreness and body aches. These side effects can affect parts of the body that were not involved in surgery.  Follow these instructions at home:    For at least 24 hours after the procedure:  Have a responsible adult stay with you. It is important to have someone help care for you until you are awake and alert.  Rest as needed.  Do not:  Participate in activities in which you could fall or become injured.  Drive.  Use heavy machinery.  Drink alcohol.  Take sleeping pills or medicines that cause drowsiness.  Make important decisions or sign legal documents.  Take care of children on your own.  Eating and drinking  Follow any instructions from your health care provider about eating or drinking restrictions.  When you feel hungry, start by eating small amounts of foods that are soft and easy to digest (bland), such as toast. Gradually return to your regular diet.  Drink enough fluid to keep your urine pale yellow.  If you vomit, rehydrate by drinking water, juice, or clear broth.  General instructions  If you have sleep apnea, surgery and certain medicines can increase your risk for breathing problems. Follow instructions from your health care provider about wearing your sleep device:  Anytime you are sleeping, including during daytime naps.  While taking prescription pain medicines, sleeping medicines, or medicines that make you drowsy.  Return to your normal activities as told by your health care  provider. Ask your health care provider what activities are safe for you.  Take over-the-counter and prescription medicines only as told by your health care provider.  If you smoke, do not smoke without supervision.  Keep all follow-up visits as told by your health care provider. This is important.  Contact a health care provider if:  You have nausea or vomiting that does not get better with medicine.  You cannot eat or drink without vomiting.  You have pain that does not get better with medicine.  You are unable to pass urine.  You develop a skin rash.  You have a fever.  You have redness around your IV site that gets worse.  Get help right away if:  You have difficulty breathing.  You have chest pain.  You have blood in your urine or stool, or you vomit blood.  Summary  After the procedure, it is common to have a sore throat or nausea. It is also common to feel tired.  Have a responsible adult stay with you for the first 24 hours after general anesthesia. It is important to have someone help care for you until you are awake and alert.  When you feel hungry, start by eating small amounts of foods that are soft and easy to digest (bland), such as toast. Gradually return to your regular diet.  Drink enough fluid to keep your urine pale yellow.  Return to your normal activities as told by your health care provider. Ask your health care   provider what activities are safe for you.  This information is not intended to replace advice given to you by your health care provider. Make sure you discuss any questions you have with your health care provider.  Document Released: 01/03/2001 Document Revised: 05/13/2017 Document Reviewed: 05/13/2017  Elsevier Interactive Patient Education  2019 [employer]

## 2018-10-10 NOTE — Anesthesia Procedure Notes (Signed)
Procedure Name: LMA Insertion Date/Time: 10/10/2018 10:48 AM Performed by: Lavina Hamman, CRNA Pre-anesthesia Checklist: Patient identified, Emergency Drugs available, Suction available, Patient being monitored and Timeout performed Patient Re-evaluated:Patient Re-evaluated prior to induction Oxygen Delivery Method: Circle system utilized Preoxygenation: Pre-oxygenation with 100% oxygen Induction Type: IV induction Ventilation: Mask ventilation without difficulty LMA: LMA inserted LMA Size: 4.0 Tube size: 4.0 mm Number of attempts: 1 Placement Confirmation: positive ETCO2 and breath sounds checked- equal and bilateral Tube secured with: Tape Dental Injury: Teeth and Oropharynx as per pre-operative assessment

## 2018-10-10 NOTE — Anesthesia Postprocedure Evaluation (Signed)
Anesthesia Post Note  Patient: Ashley Mcconnell  Procedure(s) Performed: CYSTOSCOPY/URETEROSCOPY/HOLMIUM LASER/STENT PLACEMENT (Left )     Patient location during evaluation: PACU Anesthesia Type: General Level of consciousness: awake and alert Pain management: pain level controlled Vital Signs Assessment: post-procedure vital signs reviewed and stable Respiratory status: spontaneous breathing, nonlabored ventilation and respiratory function stable Cardiovascular status: blood pressure returned to baseline and stable Postop Assessment: no apparent nausea or vomiting Anesthetic complications: no    Last Vitals:  Vitals:   10/10/18 1215 10/10/18 1230  BP:    Pulse:    Resp:    Temp:  36.9 C  SpO2: 95%     Last Pain:  Vitals:   10/10/18 1200  TempSrc:   PainSc: 0-No pain                 Audry Pili

## 2018-10-10 NOTE — H&P (Signed)
Urology H+P Note   Admitting Attending: Aleen Campi, MD  Chief Complaint:  Flank pain  HPI: Ashley Mcconnell is a 57 y.o. female with long history of nephrolithiasis presenting from outside ED obstructing left ureteral stone.  Patient with flank pain ongoing for several weeks. She has been seen recently by outside providers where there was concern for UTI (09/11/18 ESBL E coli). She reports being on several course of antibiotics, most recently Bactrim. Her pain worsened yesterday, prompting a visit to the ED where workup revealed an 11mm distal left ureteral stone. Her UA was concerning for possible UTI. She was transferred for formal urologic consultation.  Patient denies fevers/chills or nausea vomiting. She describes a history of stones dating back to the 1990s, requiring surgical intervention by outside provider. She reports that she passed an ~49mm stone on the right a couple months ago. She denies chronic history of UTIs.   Past Medical History: Past Medical History:  Diagnosis Date  . Cardiomyopathy ([place])   . Kidney stones   . Migraine   . Morton's neuroma    L Foot  . Pulmonary embolism [facility])     Past Surgical History:  Past Surgical History:  Procedure Laterality Date  . ABDOMINAL HYSTERECTOMY    . CARDIAC SURGERY  04/2014   Catherization   . CHOLECYSTECTOMY    . LITHOTRIPSY     x5  . TONSILLECTOMY  1969    Medication: Current Facility-Administered Medications  Medication Dose Route Frequency Provider Last Rate Last Dose  . acetaminophen (TYLENOL) tablet 650 mg  650 mg Oral Q4H PRN Jamese Trauger M, MD      . cefTRIAXone (ROCEPHIN) 1 g in sodium chloride 0.9 % 100 mL IVPB  1 g Intravenous Q24H Gussie Murton M, MD      . dextrose 5 %-0.45 % sodium chloride infusion   Intravenous Continuous Latash Nouri M, MD      . hydrochlorothiazide (MICROZIDE) capsule 12.5 mg  12.5 mg Oral Daily Brandley Aldrete M, MD      . morphine 2 MG/ML injection 2 mg  2 mg Intravenous Q2H PRN Niki Payment M, MD       . ondansetron (ZOFRAN) injection 4 mg  4 mg Intravenous Q4H PRN Farron Lafond M, MD      . oxyCODONE (Oxy IR/ROXICODONE) immediate release tablet 5 mg  5 mg Oral Q4H PRN Clydene Laming, Yalexa Blust M, MD       Current Outpatient Medications  Medication Sig Dispense Refill  . cholecalciferol (VITAMIN D) 1000 units tablet Take 2,000 Units by mouth daily.    . diclofenac (VOLTAREN) 75 MG EC tablet Take 1 tablet (75 mg total) by mouth 2 (two) times daily. (Patient taking differently: Take 75 mg by mouth 2 (two) times daily as needed for moderate pain. ) 60 tablet 2  . hydrochlorothiazide (HYDRODIURIL) 12.5 MG tablet Take 12.5 mg by mouth daily.    Marland Kitchen omeprazole (PRILOSEC) 40 MG capsule Take 40 mg by mouth daily.    . tamsulosin (FLOMAX) 0.4 MG CAPS capsule Take 0.4 mg by mouth daily after supper.     . triamcinolone ointment (KENALOG) 0.1 % Apply 1 application topically 2 (two) times daily as needed (itching).     . vitamin C (ASCORBIC ACID) 500 MG tablet Take 500 mg by mouth daily.      Allergies: Allergies  Allergen Reactions  . Contrast Media [Iodinated Diagnostic Agents]     Rash   . Fish Allergy Rash    [name]  is only fish she does not have allergy to    Social History: Social History   Tobacco Use  . Smoking status: Former Smoker    Packs/day: 0.20    Years: 2.00    Pack years: 0.40    Types: Cigarettes    Last attempt to quit: 07/24/1986    Years since quitting: 32.2  . Smokeless tobacco: Never Used  . Tobacco comment: social smoker  Substance Use Topics  . Alcohol use: Yes    Alcohol/week: 0.0 standard drinks    Comment: 1 drink on New Year's Eve  . Drug use: No    Family History Family History  Problem Relation Age of Onset  . Breast cancer Mother   . Stroke Father   . Heart Problems Father        heart bypass  . Alzheimer's disease Paternal Aunt   . Alzheimer's disease Paternal Uncle   . Heart Problems Paternal Grandmother        enlarged heart  . Alzheimer's disease  Paternal Aunt   . Alzheimer's disease Paternal Aunt     Review of Systems 10 systems were reviewed and are negative except as noted specifically in the HPI.  Objective   Vital signs in last 24 hours: BP (!) 131/47 (BP Location: Right Arm)   Pulse 88   Temp 98.2 F (36.8 C) (Oral)   Resp 18   Ht 5\' 5"  (1.651 m)   Wt 99.8 kg   SpO2 97%   BMI 36.61 kg/m   Physical Exam General: NAD, A&O, resting, appropriate HEENT: [place]/AT, EOMI, MMM Pulmonary: Normal work of breathing Cardiovascular: HDS, adequate peripheral perfusion Abdomen: Soft, NTTP, nondistended. GU: left CVAT Extremities: warm and well perfused Neuro: Appropriate, no focal neurological deficits  Most Recent Labs: Lab Results  Component Value Date   WBC 9.4 10/09/2018   HGB 12.9 10/09/2018   HCT 40.5 10/09/2018   PLT 263 10/09/2018    Lab Results  Component Value Date   NA 138 10/09/2018   K 3.3 (L) 10/09/2018   CL 104 10/09/2018   CO2 27 10/09/2018   BUN 19 10/09/2018   CREATININE 0.93 10/09/2018   CALCIUM 9.3 10/09/2018    Lab Results  Component Value Date   INR 1.04 03/23/2010   APTT 31 03/23/2010     IMAGING: Ct Renal Stone Study  Result Date: 10/09/2018 CLINICAL DATA:  Left flank tenderness EXAM: CT ABDOMEN AND PELVIS WITHOUT CONTRAST TECHNIQUE: Multidetector CT imaging of the abdomen and pelvis was performed following the standard protocol without IV contrast. COMPARISON:  02/28/2014 FINDINGS: Lower chest: Lung bases are clear. No effusions. Heart is normal size. Hepatobiliary: No focal hepatic abnormality. Gallbladder unremarkable. Pancreas: No focal abnormality or ductal dilatation. Spleen: No focal abnormality.  Normal size. Adrenals/Urinary Tract: Adrenal glands unremarkable. There is moderate left hydronephrosis due to a 7-8 mm distal left ureteral stone several cm above the left ureterovesical junction. Bilateral punctate nephrolithiasis. No hydronephrosis on the right. Urinary bladder  unremarkable. Stomach/Bowel: Normal appendix. Stomach, large and small bowel grossly unremarkable. Vascular/Lymphatic: No evidence of aneurysm or adenopathy. Reproductive: Prior hysterectomy.  No adnexal masses. Other: No free fluid or free air. Bilateral inguinal hernias containing fat. Musculoskeletal: No acute bony abnormality. IMPRESSION: 7-8 mm distal left ureteral stone with moderate left hydronephrosis. Bilateral punctate nephrolithiasis. Bilateral inguinal hernias containing fat. Electronically Signed   By: Rolm Baptise M.D.   On: 10/09/2018 20:35    ------  Assessment:  57 y.o. female with  history of nephrolithiasis presenting with 6mm left distal ureteral stone.  Patient AF and HDS. Non-toxic appearing. No leukocytosis. UA with positive nitrite, moderate LE, >50 WBCs, many bacteria. UCx pending. No dysuria. Cr WNL.  We did discuss that in the setting of an obstructing kidney stone with concern for UTI, we would recommend proceeding with placement of left ureteral stent with delayed treatment of her kidney stone. We discussed at length the risks and benefits with the patient. The patient is in agreement with our recommendation and understands the risks and benefits of this decision.   Plan: - Admit to Urology - NPO with plans for placement of left ureteral stent - Follow up culture results, will start Meropenem empirically given recent history of ESBL E coli UTI - We will defer definitive stone treatment to later date

## 2018-10-10 NOTE — Op Note (Signed)
Preoperative Diagnosis: Left nephrolithiasis  Postoperative Diagnosis:  Same  Procedure(s) Performed:   - Cystourethroscopy - Left ureteroscopic stone extraction with laser lithotripsy - Left ureteral stent placement  Teaching Surgeon:  Ellison Hughs, MD  Resident Surgeon:   Rob Bunting, MD  Assistant(s):  None  Anesthesia:  General  Fluids:  See anesthesia record  Estimated blood loss:  0cc  Specimens:  Stone for analysis  Drains:  Left 6Fr x 24cm JJ ureteral stent without dangler  Complications:  None  Indications: 57 y.o. patient with a history of 4mm left distal ureteral stone. She presents for left ureteroscopic stone extraction. Risks & benefits of the procedure discussed with the patient, who wishes to proceed.  Findings:  1.) 45mm ureteral stone in left distal ureter with surrounding mucosal erythema/edema 2.) Stone successfully fragmented and basket extracted in its entirety 3.) Successful placement of left ureteral stent, 6Fr x 24cm JJ without dangler  Description:  The patient was correctly identified in the preop holding area where written informed consent as well potential risk and complication reviewed. The patient agreed. They were brought back to the operative suite where a preinduction timeout was performed. Once correct information was verified, general anesthesia was induced. They were then gently placed into dorsal lithotomy position with SCDs in place for VTE prophylaxis. They were prepped and draped in the usual sterile fashion and given appropriate preoperative antibiotics. A second timeout was then performed.   We inserted a 52F rigid cystoscope per urethra with copious lubrication and normal saline irrigation running. This demonstrated findings as described above.    We cannulated the left ureteral orifice with the combination of a sensor wire which was advanced into the expected location of the renal pelvis without difficulty. We then removed the  cystoscope leaving our sensor wire in place.  We then obtained a semirigid ureteroscope and advanced this into the distal ureter, alongside the wire until we encountered the stone. Wethen obtained a 200 micron holmium laser fiber and performed laser lithotripsy until all of the stone burden was fragmented into several small fragments which were basket extracted using a zero-tip nitinol wire basket without complication. Stone fragments were sent for chemical analysis. We then re-explored the location of stone treatment which demonstrated no additional stone fragments. At this point we elected to leave a ureteral stent and withdrew our instruments leaving a sensor wire in place. We then advanced a 6Fr x 24cm JJ ureteral stent without dangler with the assistance of a stent pusher under direct fluoroscopic & visual guidance without difficultly. Sensor wire removal demonstrated satisfactory stent curl proximally in the renal pelvis and distally in the bladder. The bladder was emptied and all instrumentation was removed. The patient was woken up from anesthesia and taken to the recovery unit for routine postoperative care.  Post Op Plan:   1. Discharge home with plan to complete 2 week course of PO Bactrim 2. Follow up final culture results 3. Return to clinic for left ureteral stent removal  Attestation:  Dr. Lovena Neighbours was present for the entire procedure.     Clydene Laming, MD Resident, Department of Urology

## 2018-10-10 NOTE — Discharge Summary (Addendum)
[facility] Discharge Summary  Admit date: 10/09/2018  Discharge date and time: 10/10/18   Discharge to: Home  Discharge Service: Urology  Discharge Attending Physician: Aleen Campi, MD  Discharge  Diagnoses: Left ureteral calculus with obstruction  Secondary Diagnosis: Active Problems:   Nephrolithiasis   OR Procedures: Procedure(s): CYSTOSCOPY/URETEROSCOPY/HOLMIUM LASER/STENT PLACEMENT 10/10/2018   Ancillary Procedures: None   Discharge Day Services: The patient was seen and examined by the Urology team both in the morning and immediately prior to discharge.  Vital signs and laboratory values were stable and within normal limits.  The physical exam was benign and unchanged and all surgical wounds were examined.  Discharge instructions were explained and all questions answered.  Subjective  No acute events overnight. Pain Controlled. No fever or chills.  Objective Patient Vitals for the past 8 hrs:  BP Temp Temp src Pulse Resp SpO2 Height  10/10/18 1011 - - - - - - 5\' 5"  (1.651 m)  10/10/18 0936 109/70 - - 65 17 94 % -  10/10/18 0741 115/70 - - 71 17 97 % -  10/10/18 0557 126/65 98.3 F (36.8 C) Oral 74 16 97 % -   Total I/O In: 1100 [I.V.:1000; IV Piggyback:100] Out: 5 [Blood:5]  General Appearance:        No acute distress Lungs:                      Normal work of breathing on room air Heart:                                Regular rate and rhythm Abdomen:                          Soft, non-tender, non-distended Extremities:                       Warm and well perfused   Hospital Course:  The patient underwent left USE on 10/10/2018.  The patient tolerated the procedure well, was extubated in the OR, and afterwards was taken to the PACU for routine post-surgical care. When stable the patient was transferred to the floor.   The patient did well postoperatively.  The patient's diet was slowly advanced and at the time of discharge was tolerating a regular diet.   The patient was discharged home Day of Surgery, at which point was tolerating a regular solid diet, was able to void spontaneously, have adequate pain control with P.O. pain medication, and could ambulate without difficulty. The patient will follow up with [place] for post op check and stent removal. She was prescribed a 14 day course of PO Bactrim with final urine cultures pending at the time of discharge.  Condition at Discharge: Improved  Discharge Medications:  Allergies as of 10/10/2018      Reactions   Contrast Media [iodinated Diagnostic Agents]    Rash   Fish Allergy Rash   [name] is only fish she does not have allergy to      Medication List    TAKE these medications   cholecalciferol 1000 units tablet Commonly known as:  VITAMIN D Take 2,000 Units by mouth daily.   diclofenac 75 MG EC tablet Commonly known as:  VOLTAREN Take 1 tablet (75 mg total) by mouth 2 (two) times daily. What changed:    when to take this  reasons to  take this   docusate sodium 100 MG capsule Commonly known as:  COLACE Take 1 capsule (100 mg total) by mouth daily as needed for mild constipation.   hydrochlorothiazide 12.5 MG tablet Commonly known as:  HYDRODIURIL Take 12.5 mg by mouth daily.   omeprazole 40 MG capsule Commonly known as:  PRILOSEC Take 40 mg by mouth daily.   ondansetron 4 MG disintegrating tablet Commonly known as:  ZOFRAN ODT Take 1 tablet (4 mg total) by mouth every 8 (eight) hours as needed for nausea or vomiting.   oxybutynin 5 MG tablet Commonly known as:  DITROPAN Take 1 tablet (5 mg total) by mouth every 8 (eight) hours as needed for bladder spasms.   oxyCODONE-acetaminophen 5-325 MG tablet Commonly known as:  PERCOCET Take 1 tablet by mouth every 4 (four) hours as needed for severe pain.   sulfamethoxazole-trimethoprim 800-160 MG tablet Commonly known as:  BACTRIM DS Take 1 tablet by mouth 2 (two) times daily for 14 days.   tamsulosin 0.4 MG Caps  capsule Commonly known as:  FLOMAX Take 0.4 mg by mouth daily after supper. What changed:  Another medication with the same name was added. Make sure you understand how and when to take each.   tamsulosin 0.4 MG Caps capsule Commonly known as:  FLOMAX Take 1 capsule (0.4 mg total) by mouth daily after supper. What changed:  You were already taking a medication with the same name, and this prescription was added. Make sure you understand how and when to take each.   triamcinolone ointment 0.1 % Commonly known as:  KENALOG Apply 1 application topically 2 (two) times daily as needed (itching).   vitamin C 500 MG tablet Commonly known as:  ASCORBIC ACID Take 500 mg by mouth daily.

## 2018-10-10 NOTE — ED Notes (Signed)
RN and NT wiped patient with CHG wipes. Patient's belongings unable to fit in envelope for Security to lock up. Patient's belongings placed in locked locker 36. This RN will take to pt when out of procedure.

## 2018-10-10 NOTE — Transfer of Care (Signed)
Immediate Anesthesia Transfer of Care Note  Patient: Ashley Mcconnell  Procedure(s) Performed: Procedure(s): CYSTOSCOPY/URETEROSCOPY/HOLMIUM LASER/STENT PLACEMENT (Left)  Patient Location: PACU  Anesthesia Type:General  Level of Consciousness:  sedated, patient cooperative and responds to stimulation  Airway & Oxygen Therapy:Patient Spontanous Breathing and Patient connected to face mask oxgen  Post-op Assessment:  Report given to PACU RN and Post -op Vital signs reviewed and stable  Post vital signs:  Reviewed and stable  Last Vitals:  Vitals:   10/10/18 0741 10/10/18 0936  BP: 115/70 109/70  Pulse: 71 65  Resp: 17 17  Temp:    SpO2: 01% 41%    Complications: No apparent anesthesia complications

## 2018-10-10 NOTE — Anesthesia Preprocedure Evaluation (Addendum)
Anesthesia Evaluation  Patient identified by MRN, date of birth, ID band Patient awake    Reviewed: Allergy & Precautions, NPO status , Patient's Chart, lab work & pertinent test results  History of Anesthesia Complications Negative for: history of anesthetic complications  Airway Mallampati: II  TM Distance: >3 FB Neck ROM: Full    Dental  (+) Dental Advisory Given, Teeth Intact   Pulmonary former smoker, PE   breath sounds clear to auscultation       Cardiovascular  Rhythm:Regular Rate:Normal   '14 TTE - Normal EF. No valvulopathy. Mildly increased septal wall thickness. Mildly increased posterior wall thickness.     Neuro/Psych  Headaches,  Morton's neuroma left foot  negative psych ROS   GI/Hepatic Neg liver ROS, GERD  Medicated and Controlled,  Endo/Other   Obesity   Renal/GU Renal disease (recurrent kidney stones)     Musculoskeletal negative musculoskeletal ROS (+)   Abdominal   Peds  Hematology negative hematology ROS (+)   Anesthesia Other Findings   Reproductive/Obstetrics  S/p hysterectomy                             Anesthesia Physical Anesthesia Plan  ASA: II  Anesthesia Plan: General   Post-op Pain Management:    Induction: Intravenous  PONV Risk Score and Plan: 3 and Treatment may vary due to age or medical condition, Ondansetron, Dexamethasone and Midazolam  Airway Management Planned: LMA  Additional Equipment: None  Intra-op Plan:   Post-operative Plan: Extubation in OR  Informed Consent: I have reviewed the patients History and Physical, chart, labs and discussed the procedure including the risks, benefits and alternatives for the proposed anesthesia with the patient or authorized representative who has indicated his/her understanding and acceptance.   Dental advisory given  Plan Discussed with: CRNA and Anesthesiologist  Anesthesia Plan  Comments:        Anesthesia Quick Evaluation

## 2018-10-10 NOTE — ED Notes (Signed)
  Called Dr Clydene Laming let hem know PT is here in room 17 .

## 2018-10-10 NOTE — OR Nursing (Signed)
Stone taken by Dr. Lovena Neighbours

## 2018-10-10 NOTE — ED Provider Notes (Signed)
10/10/18 12:19 AM  Patient sent from [facility] for 7 to 8 mm distal left ureteral stone in the context of urinary infection.  [facility] to [facility] seen by [facility] for emergent [facility]ing.  The patient states her pain is down to a 3-1/2 and is adequately controlled at this time.  She has no left CVA tenderness but does have left lower quadrant tenderness.  Her abdomen is soft and nondistended.     Latravion Graves, Jenny Reichmann, MD 10/10/18 0030

## 2018-10-11 ENCOUNTER — Encounter (HOSPITAL_COMMUNITY): Payer: Self-pay | Admitting: Urology

## 2018-10-12 LAB — URINE CULTURE

## 2019-08-23 ENCOUNTER — Other Ambulatory Visit: Payer: Self-pay | Admitting: Physician Assistant

## 2019-08-23 DIAGNOSIS — Z1231 Encounter for screening mammogram for malignant neoplasm of breast: Secondary | ICD-10-CM

## 2019-10-19 ENCOUNTER — Other Ambulatory Visit: Payer: Self-pay

## 2019-10-19 ENCOUNTER — Ambulatory Visit
Admission: RE | Admit: 2019-10-19 | Discharge: 2019-10-19 | Disposition: A | Discharge status: Home / Self Care | Payer: No Typology Code available for payment source | Source: Ambulatory Visit | Attending: Physician Assistant | Admitting: Physician Assistant

## 2019-10-19 DIAGNOSIS — Z1231 Encounter for screening mammogram for malignant neoplasm of breast: Secondary | ICD-10-CM

## 2020-05-01 ENCOUNTER — Encounter: Payer: Self-pay | Admitting: Emergency Medicine

## 2020-05-01 ENCOUNTER — Ambulatory Visit
Admission: EM | Admit: 2020-05-01 | Discharge: 2020-05-01 | Disposition: A | Discharge status: Home / Self Care | Payer: No Typology Code available for payment source | Attending: Family Medicine | Admitting: Family Medicine

## 2020-05-01 ENCOUNTER — Other Ambulatory Visit: Payer: Self-pay

## 2020-05-01 DIAGNOSIS — L259 Unspecified contact dermatitis, unspecified cause: Secondary | ICD-10-CM | POA: Diagnosis not present

## 2020-05-01 MED ORDER — PREDNISONE 20 MG PO TABS: 40.0000 mg | ORAL_TABLET | Freq: Every day | ORAL | 0 refills | Status: AC

## 2020-05-01 MED ORDER — DEXAMETHASONE SODIUM PHOSPHATE 10 MG/ML IJ SOLN
10.0000 mg | Freq: Once | INTRAMUSCULAR | Status: AC
  Administered 2020-05-01: 10 mg via INTRAMUSCULAR

## 2020-05-01 NOTE — ED Notes (Signed)
Patient able to ambulate independently  

## 2020-05-01 NOTE — ED Provider Notes (Signed)
[facility]    CSN: [id number] Arrival date & time: 05/01/20  1128      History   Chief Complaint Chief Complaint  Patient presents with  . Rash    HPI Ashley Mcconnell is a 59 y.o. female.   HPI  Patient presents with a 3-day history of a skin rash initially localized to her forehead.  The rash is now began to extend to other portions of her face and neck.  Patient has unknown allergy to fish and she is a Therapist, sports at a medical facility.  She denies any known close contact with seafood.  She has been taking Benadryl for itching with mild relief.  She denies any changes in detergents , eating new foods, or contact with any outdoor irritants such as poison ivy or poison oak.  She is not having any difficulty breathing. Past Medical History:  Diagnosis Date  . Cardiomyopathy ([place])   . Kidney stones   . Migraine   . Morton's neuroma    L Foot  . Pulmonary embolism [facility])     Patient Active Problem List   Diagnosis Date Noted  . Nephrolithiasis 10/10/2018  . Acute bronchitis 03/15/2016  . Dyspnea 07/25/2015  . 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  . GERD (gastroesophageal reflux disease) 07/25/2015  . Obesity 07/25/2015  . Chest wall pain 07/25/2015  . 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 ABDOMINAL HYSTERECTOMY    . CARDIAC SURGERY  04/2014   Catherization   . CHOLECYSTECTOMY    . CYSTOSCOPY/URETEROSCOPY/HOLMIUM LASER/STENT PLACEMENT Left 10/10/2018   Procedure: CYSTOSCOPY/URETEROSCOPY/HOLMIUM LASER/STENT PLACEMENT;  Surgeon: Ceasar Mons, MD;  Location: [facility];  Service: Urology;  Laterality: Left;  . LITHOTRIPSY     x5  . TONSILLECTOMY  1969    OB History   No obstetric history on file.      Home Medications    Prior to Admission medications   Medication Sig Start Date End Date Taking? Authorizing Provider  cholecalciferol (VITAMIN D) 1000 units tablet Take  2,000 Units by mouth daily.    [provider]  diclofenac (VOLTAREN) 75 MG EC tablet Take 1 tablet (75 mg total) by mouth 2 (two) times daily. Patient taking differently: Take 75 mg by mouth 2 (two) times daily as needed for moderate pain.  06/08/17   Wallene Huh, DPM  hydrochlorothiazide (HYDRODIURIL) 12.5 MG tablet Take 12.5 mg by mouth daily.    [provider]  omeprazole (PRILOSEC) 40 MG capsule Take 40 mg by mouth daily.    [provider]  ondansetron (ZOFRAN ODT) 4 MG disintegrating tablet Take 1 tablet (4 mg total) by mouth every 8 (eight) hours as needed for nausea or vomiting. 10/10/18   Clydene Laming, Case M, MD  oxybutynin (DITROPAN) 5 MG tablet Take 1 tablet (5 mg total) by mouth every 8 (eight) hours as needed for bladder spasms. 10/10/18   Clydene Laming, Case M, MD  tamsulosin (FLOMAX) 0.4 MG CAPS capsule Take 0.4 mg by mouth daily after supper.     [provider]  tamsulosin (FLOMAX) 0.4 MG CAPS capsule Take 1 capsule (0.4 mg total) by mouth daily after supper. 10/10/18   Clydene Laming, Case M, MD  triamcinolone ointment (KENALOG) 0.1 % Apply 1 application topically 2 (two) times daily as needed (itching).  07/05/18   [provider]  vitamin C (ASCORBIC ACID) 500 MG tablet Take 500 mg by mouth daily.    [provider]    Family History Family History  Problem Relation Age of Onset  . Breast cancer Mother   . Stroke Father   . Heart Problems Father        heart bypass  . Alzheimer's disease Paternal Aunt   . Alzheimer's disease Paternal Uncle   . Heart Problems Paternal Grandmother        enlarged heart  . Alzheimer's disease Paternal Aunt   . Alzheimer's disease Paternal Aunt     Social History Social History   Tobacco Use  . Smoking status: Former Smoker    Packs/day: 0.20    Years: 2.00    Pack years: 0.40    Types: Cigarettes    Quit date: 07/24/1986    Years since quitting: 33.7  . Smokeless tobacco: Never Used  . Tobacco  comment: social smoker  Substance Use Topics  . Alcohol use: Yes    Alcohol/week: 0.0 standard drinks    Comment: 1 drink on New Year's Eve  . Drug use: No     Allergies   Contrast media [iodinated diagnostic agents] and Fish allergy   Review of Systems Review of Systems Pertinent negatives listed in HPI Physical Exam Triage Vital Signs ED Triage Vitals [05/01/20 1136]  Enc Vitals Group     BP 119/80     Pulse Rate 85     Resp 18     Temp 98.5 F (36.9 C)     Temp Source Oral     SpO2 96 %     Weight      Height      Head Circumference      Peak Flow      Pain Score 0     Pain Loc      Pain Edu?      Excl. in [place]?    No data found.  Updated Vital Signs BP 119/80 (BP Location: Left Arm)   Pulse 85   Temp 98.5 F (36.9 C) (Oral)   Resp 18   SpO2 96%   Visual Acuity Right Eye Distance:   Left Eye Distance:   Bilateral Distance:    Right Eye Near:   Left Eye Near:    Bilateral Near:     Physical Exam General appearance: alert, well developed, well nourished, cooperative and in no distress Head: Normocephalic, without obvious abnormality, atraumatic Respiratory: Respirations even and unlabored, normal respiratory rate Heart: rate and rhythm normal. No gallop or murmurs noted on exam  Abdomen: BS +, no distention, no rebound tenderness, or no mass Extremities: No gross deformities Skin: Positive for facial maculopapular rash Psych: Appropriate mood and affect. .  UC Treatments / Results  Labs (all labs ordered are listed, but only abnormal results are displayed) Labs Reviewed - No data to display  EKG   Radiology No results found.  Procedures Procedures (including critical care time)  Medications Ordered in UC Medications - No data to display  Initial Impression / Assessment and Plan / UC Course  I have reviewed the triage vital signs and the nursing notes.  Pertinent labs & imaging results that were available during my care of the  patient were reviewed by me and considered in my medical decision making (see chart for details).    Decadron 10 mg IM given here in clinic today.  Will prescribe a short course of prednisone to start tomorrow if needed or if rash does not significantly improve with today's dose of Decadron.  Patient has  famotidine 40 mg at home and will continue taking daily for 5 days. She may continue Benadryl as needed for itching. Return for evaluation if symptoms worsen or do not completely improve with prescribed therapy. Final Clinical Impressions(s) / UC Diagnoses   Final diagnoses:  Contact dermatitis, unspecified contact dermatitis type, unspecified trigger   Discharge Instructions   None    ED Prescriptions    None     PDMP not reviewed this encounter.   Scot Jun, FNP 05/01/20 1210

## 2020-05-01 NOTE — ED Triage Notes (Signed)
Pt presents to [facility] for assessment of rash to face starting 3 days ago.  Patient states she has tried benadryl and hydrocortisone cream without relief.

## 2020-05-01 NOTE — Discharge Instructions (Addendum)
You received a Decadron injection today which is a steroid oral injection.  This should resolve your facial rash exacerbation. I have sent over a prescription for prednisone 40 mg once daily with breakfast for total of 5 days.  You only need to pick up this prescription if your facial rash has not significantly improved by tomorrow. Continue to take 40 mg of famotidine once daily for total of 5 days.  Return for follow-up evaluation if symptoms worsen or do not improve.

## 2020-06-27 ENCOUNTER — Ambulatory Visit
Admission: EM | Admit: 2020-06-27 | Discharge: 2020-06-27 | Disposition: A | Discharge status: Home / Self Care | Payer: No Typology Code available for payment source | Attending: Physician Assistant | Admitting: Physician Assistant

## 2020-06-27 DIAGNOSIS — R310 Gross hematuria: Secondary | ICD-10-CM | POA: Insufficient documentation

## 2020-06-27 DIAGNOSIS — R1011 Right upper quadrant pain: Secondary | ICD-10-CM

## 2020-06-27 DIAGNOSIS — R1031 Right lower quadrant pain: Secondary | ICD-10-CM | POA: Diagnosis present

## 2020-06-27 DIAGNOSIS — R350 Frequency of micturition: Secondary | ICD-10-CM | POA: Diagnosis present

## 2020-06-27 DIAGNOSIS — R109 Unspecified abdominal pain: Secondary | ICD-10-CM | POA: Diagnosis present

## 2020-06-27 DIAGNOSIS — Z87442 Personal history of urinary calculi: Secondary | ICD-10-CM | POA: Diagnosis present

## 2020-06-27 LAB — POCT URINALYSIS DIP (MANUAL ENTRY)
Bilirubin, UA: NEGATIVE
Glucose, UA: NEGATIVE mg/dL
Ketones, POC UA: NEGATIVE mg/dL
Nitrite, UA: NEGATIVE
Protein Ur, POC: 30 mg/dL — AB
Spec Grav, UA: 1.025 (ref 1.010–1.025)
Urobilinogen, UA: 0.2 E.U./dL
pH, UA: 6.5 (ref 5.0–8.0)

## 2020-06-27 MED ORDER — OXYCODONE-ACETAMINOPHEN 5-325 MG PO TABS: 1.0000 | ORAL_TABLET | Freq: Four times a day (QID) | ORAL | 0 refills | Status: DC | PRN

## 2020-06-27 MED ORDER — SULFAMETHOXAZOLE-TRIMETHOPRIM 800-160 MG PO TABS: 1.0000 | ORAL_TABLET | Freq: Two times a day (BID) | ORAL | 0 refills | Status: AC

## 2020-06-27 MED ORDER — TAMSULOSIN HCL 0.4 MG PO CAPS: 0.4000 mg | ORAL_CAPSULE | Freq: Every day | ORAL | 0 refills | Status: DC

## 2020-06-27 MED ORDER — ONDANSETRON 4 MG PO TBDP: 4.0000 mg | ORAL_TABLET | Freq: Three times a day (TID) | ORAL | 0 refills | Status: DC | PRN

## 2020-06-27 NOTE — ED Provider Notes (Signed)
[facility]    CSN: [id number] Arrival date & time: 06/27/20  1228      History   Chief Complaint Chief Complaint  Patient presents with   Abdominal Pain    HPI Jeffifer Rabold is a 59 y.o. female.   59 year old female presents with abdominal pain, flank pain and hematuria for the past week. Started with some abdominal pain and passing small clots of blood in her urine. Pain and symptoms are getting worse with chills, nausea and vomiting last night. Denies any distinct fever, dysuria, or unusual vaginal discharge. Has noticed increased urinary frequency and urgency with decreased urine flow and loose stools. Has history of kidney infection with kidney stones with sepsis in Dec 2019-Jan 2020 and concerned she is having another infection and possible additional stones. Has taken Aleve with some relief. Has not followed up with Urology since previous Urologist is not in network with her insurance. Other chronic health issues include HTN, migraine headaches and GERD and currently on HCTZ, Ditropan, and Prilosec daily.   The history is provided by the patient.    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      Home Medications    Prior to Admission medications   Medication Sig Start Date End Date Taking? Authorizing Provider  cholecalciferol (VITAMIN D) 1000 units tablet Take 2,000 Units by mouth daily.    [provider]  hydrochlorothiazide (HYDRODIURIL) 12.5 MG tablet Take 12.5 mg by mouth daily.    [provider]  omeprazole (PRILOSEC) 40 MG capsule Take 40 mg by mouth daily.    [provider]  ondansetron (ZOFRAN ODT) 4 MG disintegrating tablet Take 1 tablet (4 mg total) by mouth every 8 (eight) hours as needed for nausea or vomiting. 06/27/20   Dillin Lofgren, Nicholes Stairs, NP  oxybutynin (DITROPAN) 5 MG tablet Take 1 tablet (5 mg total) by mouth every 8 (eight) hours as needed for bladder spasms. 10/10/18   Clydene Laming, Case M, MD  oxyCODONE-acetaminophen (PERCOCET/ROXICET) 5-325 MG tablet Take 1-2 tablets by mouth every 6 (six) hours as needed for severe pain. 06/27/20   Katy Apo, NP  sulfamethoxazole-trimethoprim (BACTRIM DS) 800-160 MG tablet Take 1 tablet by mouth 2 (two) times daily for 7 days. 06/27/20 07/04/20  Katy Apo, NP  tamsulosin (FLOMAX) 0.4 MG CAPS capsule Take 1 capsule (0.4 mg total) by mouth daily. 06/27/20   Katy Apo, NP  triamcinolone ointment (KENALOG) 0.1 % Apply 1 application topically 2 (two) times daily as  needed (itching).  07/05/18   [provider]    Family History Family History  Problem Relation Age of Onset   Breast cancer Mother    Stroke Father    Heart Problems Father        heart bypass   Alzheimer's disease Paternal Aunt    Alzheimer's disease Paternal Uncle    Heart Problems Paternal Grandmother        enlarged heart   Alzheimer's disease Paternal Aunt    Alzheimer's disease Paternal Aunt     Social History Social History   Tobacco Use    Smoking status: Former Smoker    Packs/day: 0.20    Years: 2.00    Pack years: 0.40    Types: Cigarettes    Quit date: 07/24/1986    Years since quitting: 33.9   Smokeless tobacco: Never Used   Tobacco comment: social smoker  Substance Use Topics   Alcohol use: Yes    Alcohol/week: 0.0 standard drinks    Comment: 1 drink on New Year's Eve   Drug use: No     Allergies   Contrast media [iodinated diagnostic agents] and Fish allergy   Review of Systems Review of Systems  Constitutional: Positive for appetite change, chills and fatigue. Negative for fever.  HENT: Negative for congestion, mouth sores and trouble swallowing.   Respiratory: Negative for chest tightness, shortness of breath and wheezing.   Gastrointestinal: Positive for abdominal pain, nausea and vomiting. Diarrhea: loose stools.  Genitourinary: Positive for decreased urine volume, difficulty urinating, flank pain, frequency, hematuria and urgency. Negative for dysuria, genital sores, pelvic pain, vaginal bleeding, vaginal discharge and vaginal pain.  Musculoskeletal: Positive for back pain. Negative for arthralgias and myalgias.  Skin: Negative for color change and rash.  Allergic/Immunologic: Positive for food allergies. Negative for immunocompromised state.  Neurological: Positive for dizziness and headaches. Negative for tremors, seizures and syncope.  Hematological: Negative for adenopathy. Does not bruise/bleed easily.     Physical Exam Triage Vital Signs ED Triage Vitals  Enc Vitals Group     BP 06/27/20 1258 139/81     Pulse Rate 06/27/20 1258 88     Resp 06/27/20 1258 18     Temp 06/27/20 1258 98.5 F (36.9 C)     Temp Source 06/27/20 1258 Oral     SpO2 06/27/20 1258 98 %     Weight --      Height --      Head Circumference --      Peak Flow --      Pain Score 06/27/20 1259 5     Pain Loc --      Pain Edu? --      Excl. in [place]? --    No data found.  Updated Vital Signs BP 139/81 (BP  Location: Left Arm)    Pulse 88    Temp 98.5 F (36.9 C) (Oral)    Resp 18    SpO2 98%   Visual Acuity Right Eye Distance:   Left Eye Distance:   Bilateral Distance:    Right Eye Near:   Left Eye Near:    Bilateral Near:     Physical Exam Vitals and nursing note reviewed.  Constitutional:      General: She is awake. She is not in acute distress.    Appearance: She is well-developed and well-groomed. She is ill-appearing.     Comments: She is sitting on the exam table in no acute distress but appears in  pain and has difficulty changing positions and getting comfortable due to pain.   HENT:     Head: Normocephalic and atraumatic.  Eyes:     Extraocular Movements: Extraocular movements intact.     Conjunctiva/sclera: Conjunctivae normal.  Cardiovascular:     Rate and Rhythm: Normal rate and regular rhythm.     Heart sounds: Normal heart sounds. No murmur heard.   Pulmonary:     Effort: Pulmonary effort is normal. No respiratory distress.     Breath sounds: Normal breath sounds and air entry. No decreased air movement. No decreased breath sounds, wheezing, rhonchi or rales.  Abdominal:     General: Abdomen is flat. Bowel sounds are normal.     Palpations: Abdomen is soft.     Tenderness: There is generalized abdominal tenderness and tenderness in the right upper quadrant, right lower quadrant and left lower quadrant. There is right CVA tenderness and left CVA tenderness. There is no guarding.  Musculoskeletal:        General: Normal range of motion.     Cervical back: Normal range of motion.  Skin:    General: Skin is warm and dry.     Findings: No rash.  Neurological:     General: No focal deficit present.     Mental Status: She is alert and oriented to person, place, and time.  Psychiatric:        Mood and Affect: Mood normal.        Behavior: Behavior normal. Behavior is cooperative.        Thought Content: Thought content normal.        Judgment: Judgment normal.       UC Treatments / Results  Labs (all labs ordered are listed, but only abnormal results are displayed) Labs Reviewed  POCT URINALYSIS DIP (MANUAL ENTRY) - Abnormal; Notable for the following components:      Result Value   Blood, UA moderate (*)    Protein Ur, POC =30 (*)    Leukocytes, UA Large (3+) (*)    All other components within normal limits  URINE CULTURE    EKG   Radiology No results found.  Procedures Procedures (including critical care time)  Medications Ordered in UC Medications - No data to display  Initial Impression / Assessment and Plan / UC Course  I have reviewed the triage vital signs and the nursing notes.  Pertinent labs & imaging results that were available during my care of the patient were reviewed by me and considered in my medical decision making (see chart for details).    Reviewed urinalysis results with patient- positive WBC's, blood and protein- possible UTI/pyelonephtitis as well as renal calculi. Imaging not available today at this Urgent Care so unable to check for kidney stones using plain x-ray. Urine sent for culture. Reviewed previous records from Urgent care visit and hospital stay at [facility] in 2019/2020. Patient indicated that Bactrim has worked best in the past and also requests medication for nausea and pain. Will start Bactrim DS 1 tablet twice a day as directed. May take Zofran 4mg  every 8 hours as needed for nausea. Patient has used Flomax before with success and will restart Flomax today daily. May continue OTC Aleve 2 tablets every 12 hours as needed for pain. May take Percocet 1 to 2 tablets every 6 hours as needed for severe pain #10 no refill. Continue to push water and fluids. Provided information on  associated Urology specialists that may be  on her insurance plan. Recommend call to schedule follow-up with Urologist. If pain gets more severe or unable to keep down fluids, go to the ER ASAP. Otherwise, follow-up  pending urine culture results.   Final Clinical Impressions(s) / UC Diagnoses   Final diagnoses:  Right upper quadrant abdominal pain  Right lower quadrant abdominal pain  Acute right flank pain  Urinary frequency  History of kidney stones  Gross hematuria     Discharge Instructions     Recommend start Bactrim 1 tablet twice a day as directed. May take Zofran 4mg  every 8 hours as needed for nausea. May start Flomax 0.4mg  once daily to help with urinary symptoms. Continue OTC Aleve 2 tablets every 12 hours as needed for pain. For more severe pain, may take Percocet 1 to 2 tablets every 6 hours as needed. Continue to push fluids. Recommend call Urology specialist to schedule follow-up. Also follow-up pending urine culture results. If pain gets more severe, go to the ER ASAP.     ED Prescriptions    Medication Sig Dispense Auth. Provider   ondansetron (ZOFRAN ODT) 4 MG disintegrating tablet Take 1 tablet (4 mg total) by mouth every 8 (eight) hours as needed for nausea or vomiting. 20 tablet Katy Apo, NP   tamsulosin (FLOMAX) 0.4 MG CAPS capsule Take 1 capsule (0.4 mg total) by mouth daily. 30 capsule Katy Apo, NP   sulfamethoxazole-trimethoprim (BACTRIM DS) 800-160 MG tablet Take 1 tablet by mouth 2 (two) times daily for 7 days. 14 tablet Roseline Ebarb, Nicholes Stairs, NP   oxyCODONE-acetaminophen (PERCOCET/ROXICET) 5-325 MG tablet Take 1-2 tablets by mouth every 6 (six) hours as needed for severe pain. 10 tablet Bettyjean Stefanski, Nicholes Stairs, NP     I have reviewed the PDMP during this encounter. Last active Rx was for Percocet in December 2019. No other active Rx. I feel the benefits outweigh the risks of a controlled medication at this time.    Katy Apo, NP 06/27/20 2255

## 2020-06-27 NOTE — ED Triage Notes (Signed)
Pt present abdominal pain with some blood in her urine. Symptoms started a week ago. Pt has a history of kidney stones.

## 2020-06-27 NOTE — Discharge Instructions (Addendum)
Recommend start Bactrim 1 tablet twice a day as directed. May take Zofran 4mg  every 8 hours as needed for nausea. May start Flomax 0.4mg  once daily to help with urinary symptoms. Continue OTC Aleve 2 tablets every 12 hours as needed for pain. For more severe pain, may take Percocet 1 to 2 tablets every 6 hours as needed. Continue to push fluids. Recommend call Urology specialist to schedule follow-up. Also follow-up pending urine culture results. If pain gets more severe, go to the ER ASAP.

## 2020-06-28 LAB — URINE CULTURE
Culture: <10000 — AB
Special Requests: NORMAL

## 2020-07-11 NOTE — Progress Notes (Signed)
07/14/2020 9:36 AM   Ashley Mcconnell Jun 27, 1961 [id number]  Referring provider: No referring provider defined for this encounter. Chief Complaint  Patient presents with   Nephrolithiasis    HPI: Ashley Mcconnell is a 59 y.o. female who presents today for evaluation and management of kidney stone.   -Patient was previously followed by [facility] in [place], [place].  -[facility] evaluation for abdomina pain, flank pain and hematuria x 1 week on 06/27/2020. -Pt noticed increased urinary frequency and urgency with decreased urine flow and loose stools.  -noted clots in her urine. No dysuria, vaginal discharge or fevers.  -UA dipstick showed moderate blood and large leukocytes.   -Pt discharged with Bactrim, Zofran and Flomax.  -Urine culture grew <10,000 colonies -Since ED visit she has had cramping and pain in her abdomen. -For pain pt is taking oxycodone and flomax.  -Denies hematuria but her urine is dark in color.  -History of kidney infection with kidney stones with sepsis in Dec 2019-Jan 2020. -CT A/P from 09/2018 noted a 7-8 mm distal left ureteral stone with moderate left hydronephrosis. Bilateral punctate nephrolithiasis.    PMH: Past Medical History:  Diagnosis Date   Cardiomyopathy [facility])    Kidney stones    Migraine    Morton's neuroma    L Foot   Pulmonary embolism [facility])     Surgical History: Past Surgical History:  Procedure Laterality Date   ABDOMINAL HYSTERECTOMY     CARDIAC SURGERY  04/2014   Catherization    CHOLECYSTECTOMY     CYSTOSCOPY/URETEROSCOPY/HOLMIUM LASER/STENT PLACEMENT Left 10/10/2018   Procedure: CYSTOSCOPY/URETEROSCOPY/HOLMIUM LASER/STENT PLACEMENT;  Surgeon: Ceasar Mons, MD;  Location: [facility];  Service: Urology;  Laterality: Left;   LITHOTRIPSY     x5   TONSILLECTOMY  1969    Home Medications:  Allergies as of 07/14/2020      Reactions   Contrast Media [iodinated Diagnostic Agents]    Rash   Fish  Allergy Rash   Ashley Mcconnell is only fish she does not have allergy to      Medication List       Accurate as of July 14, 2020  9:36 AM. If you have any questions, ask your nurse or doctor.        cholecalciferol 1000 units tablet Commonly known as: VITAMIN D Take 2,000 Units by mouth daily.   hydrochlorothiazide 12.5 MG tablet Commonly known as: HYDRODIURIL Take 12.5 mg by mouth daily.   omeprazole 40 MG capsule Commonly known as: PRILOSEC Take 40 mg by mouth daily.   ondansetron 4 MG disintegrating tablet Commonly known as: Zofran ODT Take 1 tablet (4 mg total) by mouth every 8 (eight) hours as needed for nausea or vomiting.   oxybutynin 5 MG tablet Commonly known as: DITROPAN Take 1 tablet (5 mg total) by mouth every 8 (eight) hours as needed for bladder spasms.   oxyCODONE-acetaminophen 5-325 MG tablet Commonly known as: PERCOCET/ROXICET Take 1-2 tablets by mouth every 6 (six) hours as needed for severe pain.   tamsulosin 0.4 MG Caps capsule Commonly known as: FLOMAX Take 1 capsule (0.4 mg total) by mouth daily.   triamcinolone ointment 0.1 % Commonly known as: KENALOG Apply 1 application topically 2 (two) times daily as needed (itching).       Allergies:  Allergies  Allergen Reactions   Contrast Media [Iodinated Diagnostic Agents]     Rash    Fish Allergy Rash    Ashley Mcconnell is only fish she does not have  allergy to    Family History: Family History  Problem Relation Age of Onset   Breast cancer Mother    Stroke Father    Heart Problems Father        heart bypass   Alzheimer's disease Paternal Aunt    Alzheimer's disease Paternal Uncle    Heart Problems Paternal Grandmother        enlarged heart   Alzheimer's disease Paternal Aunt    Alzheimer's disease Paternal Aunt     Social History:  reports that she quit smoking about 33 years ago. Her smoking use included cigarettes. She has a 0.40 pack-year smoking history. She has never used smokeless  tobacco. She reports current alcohol use. She reports that she does not use drugs.   Physical Exam: BP 118/67    Pulse 88    Ht 5\' 5"  (1.651 m)    Wt 229 lb (103.9 kg)    BMI 38.11 kg/m   Constitutional:  Alert and oriented, No acute distress. HEENT: [place] AT, moist mucus membranes.  Trachea midline, no masses. Cardiovascular: No clubbing, cyanosis, or edema. Respiratory: Normal respiratory effort, no increased work of breathing. Skin: No rashes, bruises or suspicious lesions. Neurologic: Grossly intact, no focal deficits, moving all 4 extremities. Psychiatric: Normal mood and affect.  Laboratory Data: Urinalysis dipstick trace blood/microscopy negative   Pertinent Imaging: KUB was ordered and personally reviewed.  There was a 2 mm calcification at the distal aspect of the 12th rib.  No definite calcifications suspicious for ureteral stones are seen.  Multiple pelvic phleboliths present.  Assessment & Plan:    1.  Nephrolithiasis Prior CT 2019 showed punctate right renal calculi No obvious ureteral calculus identified that would account for her pain or gross hematuria  2.  Flank/abdominal pain Most likely secondary to a ureteral calculus Refill oxycodone sent to pharmacy  3.  Gross hematuria She has a contrast allergy and will order a stone protocol CT however if no significant findings will need MR urogram   [facility] [address], [place], [place] [address] [phone]  I, Selena Batten, am acting as a scribe for Dr. Nicki Reaper C. Sanela Evola,  I have reviewed the above documentation for accuracy and completeness, and I agree with the above.    Abbie Sons, MD

## 2020-07-14 ENCOUNTER — Other Ambulatory Visit: Payer: Self-pay

## 2020-07-14 ENCOUNTER — Ambulatory Visit
Admission: RE | Admit: 2020-07-14 | Discharge: 2020-07-14 | Disposition: A | Discharge status: Home / Self Care | Payer: 59 | Source: Ambulatory Visit | Attending: Urology | Admitting: Urology

## 2020-07-14 ENCOUNTER — Ambulatory Visit (INDEPENDENT_AMBULATORY_CARE_PROVIDER_SITE_OTHER): Payer: No Typology Code available for payment source | Admitting: Urology

## 2020-07-14 ENCOUNTER — Encounter: Payer: Self-pay | Admitting: Urology

## 2020-07-14 ENCOUNTER — Ambulatory Visit
Admission: RE | Admit: 2020-07-14 | Discharge: 2020-07-14 | Disposition: A | Discharge status: Home / Self Care | Payer: 59 | Attending: Urology | Admitting: Urology

## 2020-07-14 VITALS — BP 118/67 | HR 88 | Ht 65.0 in | Wt 229.0 lb

## 2020-07-14 DIAGNOSIS — N200 Calculus of kidney: Secondary | ICD-10-CM

## 2020-07-14 DIAGNOSIS — R319 Hematuria, unspecified: Secondary | ICD-10-CM | POA: Insufficient documentation

## 2020-07-14 DIAGNOSIS — R109 Unspecified abdominal pain: Secondary | ICD-10-CM | POA: Diagnosis not present

## 2020-07-14 DIAGNOSIS — R310 Gross hematuria: Secondary | ICD-10-CM

## 2020-07-14 LAB — URINALYSIS, COMPLETE
Bilirubin, UA: NEGATIVE
Glucose, UA: NEGATIVE
Ketones, UA: NEGATIVE
Nitrite, UA: NEGATIVE
Protein,UA: NEGATIVE
Specific Gravity, UA: 1.02 (ref 1.005–1.030)
Urobilinogen, Ur: 0.2 mg/dL (ref 0.2–1.0)
pH, UA: 7.5 (ref 5.0–7.5)

## 2020-07-14 LAB — MICROSCOPIC EXAMINATION

## 2020-07-14 MED ORDER — OXYCODONE-ACETAMINOPHEN 5-325 MG PO TABS: 1.0000 | ORAL_TABLET | Freq: Four times a day (QID) | ORAL | 0 refills | Status: DC | PRN

## 2020-07-23 ENCOUNTER — Other Ambulatory Visit: Payer: Self-pay

## 2020-07-23 ENCOUNTER — Ambulatory Visit
Admission: RE | Admit: 2020-07-23 | Discharge: 2020-07-23 | Disposition: A | Discharge status: Home / Self Care | Payer: No Typology Code available for payment source | Source: Ambulatory Visit | Attending: Urology | Admitting: Urology

## 2020-07-23 DIAGNOSIS — R310 Gross hematuria: Secondary | ICD-10-CM

## 2020-07-23 DIAGNOSIS — R109 Unspecified abdominal pain: Secondary | ICD-10-CM | POA: Diagnosis present

## 2020-07-23 DIAGNOSIS — N200 Calculus of kidney: Secondary | ICD-10-CM | POA: Diagnosis present

## 2020-07-25 ENCOUNTER — Encounter: Payer: Self-pay | Admitting: Urology

## 2020-07-28 ENCOUNTER — Telehealth: Payer: Self-pay | Admitting: Family Medicine

## 2020-07-28 NOTE — Telephone Encounter (Signed)
Patient notified and appointment made

## 2020-07-28 NOTE — Telephone Encounter (Signed)
-----   Message from Abbie Sons, MD sent at 07/25/2020  5:40 PM EDT ----- Sent patient a my chart message regarding CT results.  Since she had gross hematuria and no definite reason identified I did recommend scheduling cystoscopy

## 2020-08-06 NOTE — Progress Notes (Signed)
° °  08/07/2020  CC:  Chief Complaint  Patient presents with   Cysto    [id number] Ashley Mcconnell is a 59 y.o. female who returns for a cystoscopy.   -CT renal stone study on 07/23/2020 revealed tiny right renal calculi. No evidence of ureteral calculi, hydronephrosis, or other acute findings. Stable small bilateral inguinal hernias containing only fat. -Patient reports her flank pain has resolved. -She has intermittent pelvic cramps and urgency.  Blood pressure 125/82, pulse 89, height 5\' 5"  (1.651 m), weight 229 lb (103.9 kg). NED. A&Ox3.   No respiratory distress   Abd soft, NT, ND Normal external genitalia with patent urethral meatus  Cystoscopy Procedure Note  Patient identification was confirmed, informed consent was obtained, and patient was prepped using Betadine solution.  Lidocaine jelly was administered per urethral meatus.    Procedure: - Flexible cystoscope introduced, without any difficulty.   - Thorough search of the bladder revealed:    normal urethral meatus    normal urothelium    no stones    no ulcers     no tumors    no urethral polyps    no trabeculation  - Ureteral orifices were normal in position and appearance.  Post-Procedure: - Patient tolerated the procedure well  Urinalysis: Dipstick negative/micro 03/17/2009 WBC  Pertinent imaging  I have personally reviewed the images  Assessment/ Plan:  1. Nephrolithiasis -CT renal stone study shows no ureteral calculi or obstruction  2. Pelvic pain -Intermittent lower pelvic cramps -Given 4 Myrbetriq 25 mg daily, # 28 samples; I have advised the patient of the side effects of Myrbetriq, such as: elevation in BP, urinary retention and/or HA   3 Gross hematuria  -UA shows 6-10 WBC otherwise negative -Cystoscopy is unremarkable -Urine culture sent    I, Selena Batten, am acting as a scribe for Dr. Nicki Reaper C. Genesi Stefanko,  I have reviewed the above documentation for accuracy and completeness, and I agree  with the above.    Abbie Sons, MD

## 2020-08-07 ENCOUNTER — Ambulatory Visit (INDEPENDENT_AMBULATORY_CARE_PROVIDER_SITE_OTHER): Payer: No Typology Code available for payment source | Admitting: Urology

## 2020-08-07 ENCOUNTER — Encounter: Payer: Self-pay | Admitting: Urology

## 2020-08-07 ENCOUNTER — Other Ambulatory Visit: Payer: Self-pay

## 2020-08-07 VITALS — BP 125/82 | HR 89 | Ht 65.0 in | Wt 229.0 lb

## 2020-08-07 DIAGNOSIS — R310 Gross hematuria: Secondary | ICD-10-CM | POA: Diagnosis not present

## 2020-08-07 MED ORDER — MIRABEGRON ER 25 MG PO TB24: 25.0000 mg | ORAL_TABLET | Freq: Every day | ORAL | 0 refills | Status: DC

## 2020-08-08 LAB — URINALYSIS, COMPLETE
Bilirubin, UA: NEGATIVE
Glucose, UA: NEGATIVE
Ketones, UA: NEGATIVE
Leukocytes,UA: NEGATIVE
Nitrite, UA: NEGATIVE
Protein,UA: NEGATIVE
Specific Gravity, UA: >1.03 — ABNORMAL HIGH (ref 1.005–1.030)
Urobilinogen, Ur: 0.2 mg/dL (ref 0.2–1.0)
pH, UA: 6 (ref 5.0–7.5)

## 2020-08-08 LAB — MICROSCOPIC EXAMINATION

## 2020-08-11 LAB — CULTURE, URINE COMPREHENSIVE

## 2020-09-25 ENCOUNTER — Telehealth: Payer: Self-pay

## 2020-09-25 NOTE — Telephone Encounter (Signed)
Att to contact pt to confirm appt no ans lvm

## 2020-09-26 ENCOUNTER — Other Ambulatory Visit: Payer: Self-pay

## 2020-09-26 ENCOUNTER — Ambulatory Visit (INDEPENDENT_AMBULATORY_CARE_PROVIDER_SITE_OTHER): Payer: 59 | Admitting: Family

## 2020-09-26 VITALS — BP 118/79 | HR 78 | Ht 65.2 in | Wt 229.0 lb

## 2020-09-26 DIAGNOSIS — R0789 Other chest pain: Secondary | ICD-10-CM

## 2020-09-26 DIAGNOSIS — I10 Essential (primary) hypertension: Secondary | ICD-10-CM | POA: Diagnosis not present

## 2020-09-26 DIAGNOSIS — Z7689 Persons encountering health services in other specified circumstances: Secondary | ICD-10-CM

## 2020-09-26 DIAGNOSIS — N200 Calculus of kidney: Secondary | ICD-10-CM

## 2020-09-26 DIAGNOSIS — K219 Gastro-esophageal reflux disease without esophagitis: Secondary | ICD-10-CM

## 2020-09-26 MED ORDER — OMEPRAZOLE 40 MG PO CPDR: 40.0000 mg | DELAYED_RELEASE_CAPSULE | Freq: Every day | ORAL | 0 refills | Status: DC

## 2020-09-26 MED ORDER — HYDROCHLOROTHIAZIDE 12.5 MG PO TABS: 12.5000 mg | ORAL_TABLET | Freq: Every day | ORAL | 0 refills | Status: DC

## 2020-09-26 NOTE — Progress Notes (Signed)
Establish care-  Has not received urology results from 07/14/20 visit with [facility] Concerned about kidney stones  Fatigue and little energy for about 2 yrs

## 2020-09-26 NOTE — Patient Instructions (Signed)
Return in 4 to 6 weeks for annual physical examination and labs.   Continue Hydrochlorothiazide for high blood pressure and follow-up in 3 months or sooner if needed.   Return within 1 week for lab related to high blood pressure monitoring.  Continue for Omeprazole for acid reflux.   Referral to Cardiology.   Referral to Urology. Hypertension, Adult Hypertension is another name for high blood pressure. High blood pressure forces your heart to work harder to pump blood. This can cause problems over time. There are two numbers in a blood pressure reading. There is a top number (systolic) over a bottom number (diastolic). It is best to have a blood pressure that is below 120/80. Healthy choices can help lower your blood pressure, or you may need medicine to help lower it. What are the causes? The cause of this condition is not known. Some conditions may be related to high blood pressure. What increases the risk?  Smoking.  Having type 2 diabetes mellitus, high cholesterol, or both.  Not getting enough exercise or physical activity.  Being overweight.  Having too much fat, sugar, calories, or salt (sodium) in your diet.  Drinking too much alcohol.  Having long-term (chronic) kidney disease.  Having a family history of high blood pressure.  Age. Risk increases with age.  Race. You may be at higher risk if you are African American.  Gender. Men are at higher risk than women before age 67. After age 77, women are at higher risk than men.  Having obstructive sleep apnea.  Stress. What are the signs or symptoms?  High blood pressure may not cause symptoms. Very high blood pressure (hypertensive crisis) may cause: ? Headache. ? Feelings of worry or nervousness (anxiety). ? Shortness of breath. ? Nosebleed. ? A feeling of being sick to your stomach (nausea). ? Throwing up (vomiting). ? Changes in how you see. ? Very bad chest pain. ? Seizures. How is this  treated?  This condition is treated by making healthy lifestyle changes, such as: ? Eating healthy foods. ? Exercising more. ? Drinking less alcohol.  Your health care provider may prescribe medicine if lifestyle changes are not enough to get your blood pressure under control, and if: ? Your top number is above 130. ? Your bottom number is above 80.  Your personal target blood pressure may vary. Follow these instructions at home: Eating and drinking   If told, follow the DASH eating plan. To follow this plan: ? Fill one half of your plate at each meal with fruits and vegetables. ? Fill one fourth of your plate at each meal with whole grains. Whole grains include whole-wheat pasta, brown rice, and whole-grain bread. ? Eat or drink low-fat dairy products, such as skim milk or low-fat yogurt. ? Fill one fourth of your plate at each meal with low-fat (lean) proteins. Low-fat proteins include fish, chicken without skin, eggs, beans, and tofu. ? Avoid fatty meat, cured and processed meat, or chicken with skin. ? Avoid pre-made or processed food.  Eat less than 1,500 mg of salt each day.  Do not drink alcohol if: ? Your doctor tells you not to drink. ? You are pregnant, may be pregnant, or are planning to become pregnant.  If you drink alcohol: ? Limit how much you use to:  0-1 drink a day for women.  0-2 drinks a day for men. ? Be aware of how much alcohol is in your drink. In the [place] one drink equals one  12 oz bottle of beer (355 mL), one 5 oz glass of wine (148 mL), or one 1 oz glass of hard liquor (44 mL). Lifestyle   Work with your doctor to stay at a healthy weight or to lose weight. Ask your doctor what the best weight is for you.  Get at least 30 minutes of exercise most days of the week. This may include walking, swimming, or biking.  Get at least 30 minutes of exercise that strengthens your muscles (resistance exercise) at least 3 days a week. This may include  lifting weights or doing Pilates.  Do not use any products that contain nicotine or tobacco, such as cigarettes, e-cigarettes, and chewing tobacco. If you need help quitting, ask your doctor.  Check your blood pressure at home as told by your doctor.  Keep all follow-up visits as told by your doctor. This is important. Medicines  Take over-the-counter and prescription medicines only as told by your doctor. Follow directions carefully.  Do not skip doses of blood pressure medicine. The medicine does not work as well if you skip doses. Skipping doses also puts you at risk for problems.  Ask your doctor about side effects or reactions to medicines that you should watch for. Contact a doctor if you:  Think you are having a reaction to the medicine you are taking.  Have headaches that keep coming back (recurring).  Feel dizzy.  Have swelling in your ankles.  Have trouble with your vision. Get help right away if you:  Get a very bad headache.  Start to feel mixed up (confused).  Feel weak or numb.  Feel faint.  Have very bad pain in your: ? Chest. ? Belly (abdomen).  Throw up more than once.  Have trouble breathing. Summary  Hypertension is another name for high blood pressure.  High blood pressure forces your heart to work harder to pump blood.  For most people, a normal blood pressure is less than 120/80.  Making healthy choices can help lower blood pressure. If your blood pressure does not get lower with healthy choices, you may need to take medicine. This information is not intended to replace advice given to you by your health care provider. Make sure you discuss any questions you have with your health care provider. Document Revised: 06/07/2018 Document Reviewed: 06/07/2018 Elsevier Patient Education  2020 [employer].

## 2020-09-26 NOTE — Progress Notes (Signed)
Subjective:    Ashley Mcconnell - 59 y.o. female MRN [id number]  Date of birth: Aug 06, 1961  HPI  Ashley Mcconnell is to establish care. Patient has a PMH significant for laryngopharyngeal reflux disease, elevated diaphragm, acute bronchitis, gastroesophageal reflux disease, nephrolithiasis, dyspnea, obesity, and chest wall pain.   Current issues and/or concerns: HEART CONCERN: Reports has cardiac history of lower left thickening of the heart. Was told her EF is 60-65%. Does have a history of cardiac cath at least 15 years ago. Has not seen Cardiology in many years. Having chest pressure and shortness of breath sometimes with exertion. Chest pressure feels like pinpricks. Reports in October 2021 there were incidental findings of micro-calcifications of the heart on a CT scan ordered by Urology. Has not been able to follow-up with Urology since then and unsure of the follow-up plan regarding this. Has concerns about the findings especially considering her past cardiac history. Today feeling well without chest pain, chest pressure, palpitations, and shortness of breath. Requesting referral to cardiologist Dr. Berniece Salines.   KIDNEY STONE CONCERN: Has frequent history of kidney stones. Last visit with Dr. Ashok Cordia with [facility] in October 2021. Reports has concerns about trace bacteria that she saw on the results of the CT scan. Says she has not heard back from office since then and unsure of the follow-up plan regarding this. Concerns as she explains that she went septic in the past related to a similar issues with her kidneys. Today feeling well overall but does still have some mild right flank pain.    HYPERTENSION FOLLOW-UP: Currently taking: see medication list Med Adherence: [x]  Yes    []  No Medication side effects: []  Yes    [x]  No Adherence with salt restriction: [x]  Yes    []  No Exercise: Yes [x]  No []  Home Monitoring?: [x]  Yes    []  No Monitoring Frequency: [x]  Yes    []  No Home BP  results range: [x]  Yes, 120's-130's/80's Smoking []  Yes [x]  No SOB? [x]  Yes, with exertion  Leg swelling?: [x]  Yes, left leg comes and goes for several years  Headaches?: [x]  Yes, sometimes, using Excedrin Migraine and Aleve, has also used Chiropractor in the past which helped  Dizziness? []  Yes    [x]  No  GERD FOLLOW-UP: Spicy foods tend to trigger symptoms requesting refill on medications.  GERD control status: controlled Satisfied with current treatment? yes Medication side effects: no  Medication compliance: stable Alleviatiating factors: Omeprazole Aggravating factors: spicy foods Dysphagia: no Odynophagia:  no Hematemesis: no Blood in stool: no  ROS per HPI   Health Maintenance:  Health Maintenance Due  Topic Date Due   Hepatitis C Screening  Never done   PAP SMEAR-Modifier  Never done   COLONOSCOPY  Never done     Past Medical History: Patient Active Problem List   Diagnosis Date Noted   Nephrolithiasis 10/10/2018   Acute bronchitis 03/15/2016   Dyspnea 07/25/2015   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   GERD (gastroesophageal reflux disease) 07/25/2015   Obesity 07/25/2015   Chest wall pain 07/25/2015   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 Her smoking use included cigarettes. She has a 0.40 pack-year smoking history. She has never used smokeless tobacco. She reports current alcohol use. She reports that she does not use drugs.   Family History  family history includes Alzheimer's disease in her paternal aunt, paternal aunt, paternal aunt, and paternal uncle; Breast  cancer in her mother; Heart Problems in her father and paternal grandmother; Stroke in her father.   Medications: reviewed and updated   Objective:   Physical Exam BP 118/79 (BP Location: Left Arm, Patient Position: Sitting)    Pulse 78    Ht 5' 5.2" (1.656 m)    Wt 229 lb (103.9 kg)    SpO2 97%    BMI 37.88  kg/m  Physical Exam HENT:     Head: Normocephalic and atraumatic.  Eyes:     Extraocular Movements: Extraocular movements intact.     Pupils: Pupils are equal, round, and reactive to light.  Cardiovascular:     Rate and Rhythm: Normal rate and regular rhythm.     Pulses: Normal pulses.     Heart sounds: Normal heart sounds.  Pulmonary:     Effort: Pulmonary effort is normal.     Breath sounds: Normal breath sounds.  Musculoskeletal:     Cervical back: Normal range of motion and neck supple.  Neurological:     General: No focal deficit present.     Mental Status: She is alert and oriented to person, place, and time.  Psychiatric:        Mood and Affect: Mood normal.      Assessment & Plan:  1. Encounter to establish care: -  Patient presents today to establish care.  - Return in 4 to 6 weeks or sooner if needed for annual physical examination, labs, and health maintenance.   2. Essential hypertension: - Blood pressure at goal during today's visit.  - Continue Hydrochlorothiazide as prescribed.  - Counseled on blood pressure goal of less than 130/80, low-sodium, DASH diet, medication compliance, 150 minutes of moderate intensity exercise per week as tolerated. Discussed medication compliance, adverse effects. - BMP to check kidney function and electrolyte balance. Will return within 1 week for lab visit to have this done. - Follow-up with primary provider in 3 months or sooner if needed. - hydrochlorothiazide (HYDRODIURIL) 12.5 MG tablet; Take 1 tablet (12.5 mg total) by mouth daily.  Dispense: 90 tablet; Refill: 0 - Basic Metabolic Panel; Future  3. Gastroesophageal reflux disease, unspecified whether esophagitis present: - Continue Omeprazole as prescribed.  - omeprazole (PRILOSEC) 40 MG capsule; Take 1 capsule (40 mg total) by mouth daily.  Dispense: 90 capsule; Refill: 0 - You may feel better if you: ?Lose weight (if you are overweight) ?Raise the head of your bed by 6  to 8 inches - You can do this by putting blocks of wood or rubber under 2 legs of the bed or a foam wedge under the mattress. ?Avoid foods that make your symptoms worse - For some people these include coffee, chocolate, alcohol, peppermint, and fatty foods. ?Stop smoking, if you smoke ?Avoid late meals - Lying down with a full stomach can make reflux worse. Try to plan meals for at least 2 to 3 hours before bedtime. ?Avoid tight clothing - Some people feel better if they wear comfortable clothing that does not squeeze the stomach area.  4. Chest wall pain: - Patient with history of chest wall pain.  - Today in clinic feeling well without chest pain, chest pressure, palpitations, and shortness of breath. - Reports has cardiac history of lower left thickening of the heart. Was told her EF is 60-65%. Does have a history of cardiac cath at least 15 years ago. Has not seen Cardiology in many years. Having chest pressure and shortness of breath sometimes with exertion. Chest  pressure feels like pinpricks.  - Reports in October 2021 there were incidental findings of micro-calcifications of the heart on a CT scan ordered by Urology. Has not been able to follow-up with Urology since then and unsure of the follow-up plan regarding this. Has concerns about the findings especially considering her past cardiac history.  - Per patient request referral to Cardiology for further evaluation and management. - Ambulatory referral to Cardiology  5. Nephrolithiasis: - Has frequent history of kidney stones. Last visit with Urology in October 2021. Reports has concerns about trace bacteria that she saw on the results of the CT scan. Says she has not heard back from office since then and unsure of the follow-up plan regarding this. Concerns as she explains that she went septic in the past related to a similar issues with her kidneys.  - Today feeling well overall but does still have some mild right flank pain.  - Referral  to Urology for further evaluation, management, and advisement. - Ambulatory referral to Urology   Durene Fruits, NP 09/26/2020, 12:41 PM Primary Care at [facility]

## 2020-09-30 ENCOUNTER — Other Ambulatory Visit: Payer: Self-pay

## 2020-09-30 ENCOUNTER — Other Ambulatory Visit (INDEPENDENT_AMBULATORY_CARE_PROVIDER_SITE_OTHER): Payer: 59

## 2020-09-30 DIAGNOSIS — I10 Essential (primary) hypertension: Secondary | ICD-10-CM | POA: Diagnosis not present

## 2020-10-01 LAB — BASIC METABOLIC PANEL
BUN/Creatinine Ratio: 21 (ref 9–23)
BUN: 16 mg/dL (ref 6–24)
CO2: 27 mmol/L (ref 20–29)
Calcium: 9.3 mg/dL (ref 8.7–10.2)
Chloride: 105 mmol/L (ref 96–106)
Creatinine, Ser: 0.78 mg/dL (ref 0.57–1.00)
GFR calc Af Amer: 98 mL/min/{1.73_m2} (ref >59)
GFR calc non Af Amer: 85 mL/min/{1.73_m2} (ref >59)
Glucose: 133 mg/dL — ABNORMAL HIGH (ref 65–99)
Potassium: 4.6 mmol/L (ref 3.5–5.2)
Sodium: 145 mmol/L — ABNORMAL HIGH (ref 134–144)

## 2020-10-01 NOTE — Progress Notes (Signed)
Please call patient with update.   Kidney function normal.   Labs are essentially normal. There are some minor variations in your blood work that do not require any additional work up at this time. We will follow-up regarding this at next appointment for your physical examination.

## 2020-10-21 ENCOUNTER — Ambulatory Visit: Payer: Self-pay | Admitting: Urology

## 2020-10-24 ENCOUNTER — Encounter: Payer: Self-pay | Admitting: Family

## 2020-10-24 ENCOUNTER — Other Ambulatory Visit: Payer: Self-pay

## 2020-10-24 ENCOUNTER — Ambulatory Visit (INDEPENDENT_AMBULATORY_CARE_PROVIDER_SITE_OTHER): Payer: No Typology Code available for payment source | Admitting: Family

## 2020-10-24 VITALS — BP 114/79 | HR 91 | Wt 233.4 lb

## 2020-10-24 DIAGNOSIS — Z13228 Encounter for screening for other metabolic disorders: Secondary | ICD-10-CM

## 2020-10-24 DIAGNOSIS — Z124 Encounter for screening for malignant neoplasm of cervix: Secondary | ICD-10-CM

## 2020-10-24 DIAGNOSIS — Z0000 Encounter for general adult medical examination without abnormal findings: Secondary | ICD-10-CM

## 2020-10-24 DIAGNOSIS — Z13220 Encounter for screening for lipoid disorders: Secondary | ICD-10-CM | POA: Diagnosis not present

## 2020-10-24 DIAGNOSIS — Z131 Encounter for screening for diabetes mellitus: Secondary | ICD-10-CM

## 2020-10-24 DIAGNOSIS — Z1231 Encounter for screening mammogram for malignant neoplasm of breast: Secondary | ICD-10-CM

## 2020-10-24 DIAGNOSIS — K219 Gastro-esophageal reflux disease without esophagitis: Secondary | ICD-10-CM

## 2020-10-24 DIAGNOSIS — Z130 Encounter for screening for diseases of the blood and blood-forming organs and certain disorders involving the immune mechanism: Secondary | ICD-10-CM

## 2020-10-24 DIAGNOSIS — Z1211 Encounter for screening for malignant neoplasm of colon: Secondary | ICD-10-CM

## 2020-10-24 DIAGNOSIS — E119 Type 2 diabetes mellitus without complications: Secondary | ICD-10-CM

## 2020-10-24 DIAGNOSIS — E785 Hyperlipidemia, unspecified: Secondary | ICD-10-CM

## 2020-10-24 DIAGNOSIS — Z1329 Encounter for screening for other suspected endocrine disorder: Secondary | ICD-10-CM

## 2020-10-24 DIAGNOSIS — Z1159 Encounter for screening for other viral diseases: Secondary | ICD-10-CM

## 2020-10-24 MED ORDER — OMEPRAZOLE 20 MG PO CPDR: 20.0000 mg | DELAYED_RELEASE_CAPSULE | Freq: Every day | ORAL | 0 refills | Status: DC

## 2020-10-24 NOTE — Progress Notes (Signed)
Patient ID: Ashley Mcconnell, female    DOB: 1961/03/04  MRN: [id number]  CC: Annual Physical Examination  Subjective: Ashley Mcconnell is a 60 y.o. female who presents for annual physical examination.   Reports has an appointment with Cardiology on 10/31/2020 for chest wall pain. Has not seen Urology as of yet because symptoms improved since last visit. Request to decrease Omeprazole from 40 mg daily to 20 mg daily as the higher dose makes her feel dehydrated.    Patient Active Problem List   Diagnosis Date Noted  . Nephrolithiasis 10/10/2018  . Acute bronchitis 03/15/2016  . Dyspnea 07/25/2015  . 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  . GERD (gastroesophageal reflux disease) 07/25/2015  . Obesity 07/25/2015  . Chest wall pain 07/25/2015  . 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 cholecalciferol (VITAMIN D) 1000 units tablet Take 2,000 Units by mouth daily.    . hydrochlorothiazide (HYDRODIURIL) 12.5 MG tablet Take 1 tablet (12.5 mg total) by mouth daily. 90 tablet 0  . ondansetron (ZOFRAN ODT) 4 MG disintegrating tablet Take 1 tablet (4 mg total) by mouth every 8 (eight) hours as needed for nausea or vomiting. 20 tablet 0  . oxybutynin (DITROPAN) 5 MG tablet Take 1 tablet (5 mg total) by mouth every 8 (eight) hours as needed for bladder spasms. 30 tablet 0  . oxyCODONE-acetaminophen (PERCOCET/ROXICET) 5-325 MG tablet Take 1-2 tablets by mouth every 6 (six) hours as needed for severe pain. 10 tablet 0  . triamcinolone ointment (KENALOG) 0.1 % Apply 1 application topically 2 (two) times daily as needed (itching).     . mirabegron ER (MYRBETRIQ) 25 MG TB24 tablet Take 1 tablet (25 mg total) by mouth daily. 30 tablet 0  . tamsulosin (FLOMAX) 0.4 MG CAPS capsule Take 1 capsule (0.4 mg total) by mouth daily. 30 capsule 0   No current facility-administered medications on file prior to visit.     Allergies  Allergen Reactions  . Contrast Media [Iodinated Diagnostic Agents]     Rash   . Fish Allergy Rash    [name] is only fish she does not have allergy to  . Shellfish Allergy Itching and Rash    Social History   Socioeconomic History  . Marital status: Single    Spouse name: Not on file  . Number of children: 3  . Years of education: College  . Highest education level: Not on file  Occupational History    Employer: OTHER    Comment: [employer].   Tobacco Use  . Smoking status: Former Smoker    Packs/day: 0.20    Years: 2.00    Pack years: 0.40    Types: Cigarettes    Quit date: 07/24/1986    Years since quitting: 34.2  . Smokeless tobacco: Never Used  . Tobacco comment: social smoker  Substance and Sexual Activity  . Alcohol use: Yes    Alcohol/week: 0.0 standard drinks    Comment: 1 drink on New Year's Eve  . Drug use: No  . Sexual activity: Not Currently  Other Topics Concern  . Not on file  Social History Narrative   Patient lives at home with her family.   Caffeine Use: 1 cup of tea two times weekly   Social Determinants of Health   Financial Resource Strain: Not on file  Food Insecurity: Not on file  Transportation Needs: Not on file  Physical  Activity: Not on file  Stress: Not on file  Social Connections: Not on file  Intimate Partner Violence: Not on file    Family History  Problem Relation Age of Onset  . Breast cancer Mother   . Stroke Father   . Heart Problems Father        heart bypass  . Alzheimer's disease Paternal Aunt   . Alzheimer's disease Paternal Uncle   . Heart Problems Paternal Grandmother        enlarged heart  . Alzheimer's disease Paternal Aunt   . Alzheimer's disease Paternal Aunt     Past Surgical History:  Procedure Laterality Date  . ABDOMINAL HYSTERECTOMY    . CARDIAC SURGERY  04/2014   Catherization   . CHOLECYSTECTOMY    . CYSTOSCOPY/URETEROSCOPY/HOLMIUM LASER/STENT PLACEMENT Left 10/10/2018    Procedure: CYSTOSCOPY/URETEROSCOPY/HOLMIUM LASER/STENT PLACEMENT;  Surgeon: Ceasar Mons, MD;  Location: [facility];  Service: Urology;  Laterality: Left;  . LITHOTRIPSY     x5  . TONSILLECTOMY  1969    ROS: Review of Systems Negative except as stated above  PHYSICAL EXAM: BP 114/79 (BP Location: Left Arm, Patient Position: Sitting)   Pulse 91   Wt 233 lb 6.4 oz (105.9 kg)   SpO2 96%   BMI 38.61 kg/m   Physical Exam Exam conducted with a chaperone present.  Constitutional:      Appearance: She is obese.  HENT:     Head: Normocephalic and atraumatic.     Right Ear: Tympanic membrane, ear canal and external ear normal.     Left Ear: Tympanic membrane, ear canal and external ear normal.     Nose: Nose normal.     Mouth/Throat:     Mouth: Mucous membranes are moist.  Eyes:     Extraocular Movements: Extraocular movements intact.     Conjunctiva/sclera: Conjunctivae normal.     Pupils: Pupils are equal, round, and reactive to light.  Cardiovascular:     Rate and Rhythm: Normal rate and regular rhythm.     Pulses: Normal pulses.  Pulmonary:     Effort: Pulmonary effort is normal.     Breath sounds: Normal breath sounds.  Chest:  Breasts:     Right: Tenderness present.     Left: Tenderness present.      Comments: Elmon Else, CMA present during examination. Abdominal:     General: Bowel sounds are normal.     Palpations: Abdomen is soft.  Genitourinary:    Comments: Patient declined examination. Musculoskeletal:        General: Normal range of motion.     Cervical back: Normal range of motion and neck supple.  Skin:    General: Skin is warm and dry.     Capillary Refill: Capillary refill takes less than 2 seconds.  Neurological:     General: No focal deficit present.     Mental Status: She is alert and oriented to person, place, and time.  Psychiatric:        Mood and Affect: Mood normal.        Behavior: Behavior normal.    ASSESSMENT AND  PLAN: 1. Annual physical exam: - Counseled on 150 minutes of exercise per week as tolerated, healthy eating (including decreased daily intake of saturated fats, cholesterol, added sugars, sodium), STI prevention, and routine healthcare maintenance.  2. Diabetes mellitus screening: - Hemoglobin A1C to screen for pre-diabetes/diabetes. - Hemoglobin A1c  3. Screening cholesterol level: - Lipid panel to screen for high cholesterol. -  Lipid Panel  4. Screening for metabolic disorder: - CMP to check kidney function, liver function, and electrolyte balance. - Comprehensive metabolic panel  5. Screening for deficiency anemia: - CBC to screen for anemia. - CBC  6. Thyroid disorder screen: - TSH to check thyroid function. - TSH  7. Need for hepatitis C screening test: - Hepatitis C screening today. - HCV Ab w/Rflx to Verification  8. Colon cancer screening: - Patient reports she recently had colonoscopy, has supporting documentation at home.  - Counseled patient to bring documentation to next appointment so that we can update her chart. Patient agreeable.   9. Encounter for screening mammogram for malignant neoplasm of breast: - Referral for breast cancer screening via mammogram. - MM Digital Screening; Future  10. Cervical cancer screening: - Patient reports she has a preferred Gynecologist for PAP smear.  - Counseled patient to notify provider of preference so that referral can be made soon. Patient agreeable.  11. Gastroesophageal reflux disease, unspecified whether esophagitis present: - Per patient request decreasing Omeprazole from 40 mg daily to 20 mg daily. - Follow-up withy primary provider as needed.  - omeprazole (PRILOSEC) 20 MG capsule; Take 1 capsule (20 mg total) by mouth daily.  Dispense: 90 capsule; Refill: 0  Patient was given the opportunity to ask questions. Patient verbalized understanding of the plan and was able to repeat key elements of the plan. Patient was  given clear instructions to go to Emergency Department or return to medical center if symptoms don't improve, worsen, or new problems develop.The patient verbalized understanding.   Orders Placed This Encounter  Procedures  . MM Digital Screening  . Comprehensive metabolic panel  . CBC  . TSH  . Lipid Panel  . Hemoglobin A1c  . HCV Ab w/Rflx to Verification  . Interpretation:     Requested Prescriptions   Signed Prescriptions Disp Refills  . omeprazole (PRILOSEC) 20 MG capsule 90 capsule 0    Sig: Take 1 capsule (20 mg total) by mouth daily.    Return for as needed with primary provider.  Camillia Herter, NP

## 2020-10-24 NOTE — Patient Instructions (Addendum)
Annual physical examination and labs today, will call with results.   Referral for mammogram.   Please call provider with dates of colonoscopy and preferred gynecologist for PAP smear.   Decrease Omeprazole to 20 mg/daily.  Follow-up with primary provider as needed. Preventive Care 61-58 Years Old, Female Preventive care refers to lifestyle choices and visits with your health care provider that can promote health and wellness. This includes:  A yearly physical exam. This is also called an annual wellness visit.  Regular dental and eye exams.  Immunizations.  Screening for certain conditions.  Healthy lifestyle choices, such as: ? Eating a healthy diet. ? Getting regular exercise. ? Not using drugs or products that contain nicotine and tobacco. ? Limiting alcohol use. What can I expect for my preventive care visit? Physical exam Your health care provider will check your:  Height and weight. These may be used to calculate your BMI (body mass index). BMI is a measurement that tells if you are at a healthy weight.  Heart rate and blood pressure.  Body temperature.  Skin for abnormal spots. Counseling Your health care provider may ask you questions about your:  Past medical problems.  Family's medical history.  Alcohol, tobacco, and drug use.  Emotional well-being.  Home life and relationship well-being.  Sexual activity.  Diet, exercise, and sleep habits.  Work and work Statistician.  Access to firearms.  Method of birth control.  Menstrual cycle.  Pregnancy history. What immunizations do I need? Vaccines are usually given at various ages, according to a schedule. Your health care provider will recommend vaccines for you based on your age, medical history, and lifestyle or other factors, such as travel or where you work.   What tests do I need? Blood tests  Lipid and cholesterol levels. These may be checked every 5 years, or more often if you are over  1 years old.  Hepatitis C test.  Hepatitis B test. Screening  Lung cancer screening. You may have this screening every year starting at age 66 if you have a 30-pack-year history of smoking and currently smoke or have quit within the past 15 years.  Colorectal cancer screening. ? All adults should have this screening starting at age 82 and continuing until age 55. ? Your health care provider may recommend screening at age 65 if you are at increased risk. ? You will have tests every 1-10 years, depending on your results and the type of screening test.  Diabetes screening. ? This is done by checking your blood sugar (glucose) after you have not eaten for a while (fasting). ? You may have this done every 1-3 years.  Mammogram. ? This may be done every 1-2 years. ? Talk with your health care provider about when you should start having regular mammograms. This may depend on whether you have a family history of breast cancer.  BRCA-related cancer screening. This may be done if you have a family history of breast, ovarian, tubal, or peritoneal cancers.  Pelvic exam and Pap test. ? This may be done every 3 years starting at age 45. ? Starting at age 72, this may be done every 5 years if you have a Pap test in combination with an HPV test. Other tests  STD (sexually transmitted disease) testing, if you are at risk.  Bone density scan. This is done to screen for osteoporosis. You may have this scan if you are at high risk for osteoporosis. Talk with your health care provider about  your test results, treatment options, and if necessary, the need for more tests. Follow these instructions at home: Eating and drinking  Eat a diet that includes fresh fruits and vegetables, whole grains, lean protein, and low-fat dairy products.  Take vitamin and mineral supplements as recommended by your health care provider.  Do not drink alcohol if: ? Your health care provider tells you not to  drink. ? You are pregnant, may be pregnant, or are planning to become pregnant.  If you drink alcohol: ? Limit how much you have to 0-1 drink a day. ? Be aware of how much alcohol is in your drink. In the [place] one drink equals one 12 oz bottle of beer (355 mL), one 5 oz glass of wine (148 mL), or one 1 oz glass of hard liquor (44 mL).   Lifestyle  Take daily care of your teeth and gums. Brush your teeth every morning and night with fluoride toothpaste. Floss one time each day.  Stay active. Exercise for at least 30 minutes 5 or more days each week.  Do not use any products that contain nicotine or tobacco, such as cigarettes, e-cigarettes, and chewing tobacco. If you need help quitting, ask your health care provider.  Do not use drugs.  If you are sexually active, practice safe sex. Use a condom or other form of protection to prevent STIs (sexually transmitted infections).  If you do not wish to become pregnant, use a form of birth control. If you plan to become pregnant, see your health care provider for a prepregnancy visit.  If told by your health care provider, take low-dose aspirin daily starting at age 38.  Find healthy ways to cope with stress, such as: ? Meditation, yoga, or listening to music. ? Journaling. ? Talking to a trusted person. ? Spending time with friends and family. Safety  Always wear your seat belt while driving or riding in a vehicle.  Do not drive: ? If you have been drinking alcohol. Do not ride with someone who has been drinking. ? When you are tired or distracted. ? While texting.  Wear a helmet and other protective equipment during sports activities.  If you have firearms in your house, make sure you follow all gun safety procedures. What's next?  Visit your health care provider once a year for an annual wellness visit.  Ask your health care provider how often you should have your eyes and teeth checked.  Stay up to date on all  vaccines. This information is not intended to replace advice given to you by your health care provider. Make sure you discuss any questions you have with your health care provider. Document Revised: 07/01/2020 Document Reviewed: 06/08/2018 Elsevier Patient Education  2021 [employer].

## 2020-10-24 NOTE — Progress Notes (Signed)
Annual physical Pt stated that she wants to decrease dosage of omperazole back to 20mg 

## 2020-10-25 DIAGNOSIS — E119 Type 2 diabetes mellitus without complications: Secondary | ICD-10-CM | POA: Insufficient documentation

## 2020-10-25 DIAGNOSIS — E785 Hyperlipidemia, unspecified: Secondary | ICD-10-CM | POA: Insufficient documentation

## 2020-10-25 LAB — COMPREHENSIVE METABOLIC PANEL
ALT: 16 IU/L (ref 0–32)
AST: 21 IU/L (ref 0–40)
Albumin/Globulin Ratio: 1.6 (ref 1.2–2.2)
Albumin: 4.6 g/dL (ref 3.8–4.9)
Alkaline Phosphatase: 64 IU/L (ref 44–121)
BUN/Creatinine Ratio: 30 — ABNORMAL HIGH (ref 9–23)
BUN: 23 mg/dL (ref 6–24)
Bilirubin Total: 0.4 mg/dL (ref 0.0–1.2)
CO2: 23 mmol/L (ref 20–29)
Calcium: 9.6 mg/dL (ref 8.7–10.2)
Chloride: 100 mmol/L (ref 96–106)
Creatinine, Ser: 0.77 mg/dL (ref 0.57–1.00)
GFR calc Af Amer: 99 mL/min/{1.73_m2} (ref >59)
GFR calc non Af Amer: 86 mL/min/{1.73_m2} (ref >59)
Globulin, Total: 2.9 g/dL (ref 1.5–4.5)
Glucose: 123 mg/dL — ABNORMAL HIGH (ref 65–99)
Potassium: 3.6 mmol/L (ref 3.5–5.2)
Sodium: 140 mmol/L (ref 134–144)
Total Protein: 7.5 g/dL (ref 6.0–8.5)

## 2020-10-25 LAB — LIPID PANEL
Chol/HDL Ratio: 4.2 ratio (ref 0.0–4.4)
Cholesterol, Total: 223 mg/dL — ABNORMAL HIGH (ref 100–199)
HDL: 53 mg/dL (ref >39)
LDL Chol Calc (NIH): 138 mg/dL — ABNORMAL HIGH (ref 0–99)
Triglycerides: 179 mg/dL — ABNORMAL HIGH (ref 0–149)
VLDL Cholesterol Cal: 32 mg/dL (ref 5–40)

## 2020-10-25 LAB — HEMOGLOBIN A1C
Est. average glucose Bld gHb Est-mCnc: 140 mg/dL
Hgb A1c MFr Bld: 6.5 % — ABNORMAL HIGH (ref 4.8–5.6)

## 2020-10-25 LAB — HCV INTERPRETATION

## 2020-10-25 LAB — CBC
Hematocrit: 41.5 % (ref 34.0–46.6)
Hemoglobin: 14.1 g/dL (ref 11.1–15.9)
MCH: 28.3 pg (ref 26.6–33.0)
MCHC: 34 g/dL (ref 31.5–35.7)
MCV: 83 fL (ref 79–97)
Platelets: 308 10*3/uL (ref 150–450)
RBC: 4.98 x10E6/uL (ref 3.77–5.28)
RDW: 12.7 % (ref 11.7–15.4)
WBC: 8.5 10*3/uL (ref 3.4–10.8)

## 2020-10-25 LAB — TSH: TSH: 2.36 u[IU]/mL (ref 0.450–4.500)

## 2020-10-25 LAB — HCV AB W/RFLX TO VERIFICATION: HCV Ab: <0.1 s/co ratio (ref 0.0–0.9)

## 2020-10-25 MED ORDER — METFORMIN HCL 500 MG PO TABS: 500.0000 mg | ORAL_TABLET | Freq: Every day | ORAL | 0 refills | Status: DC

## 2020-10-25 MED ORDER — TRUEPLUS LANCETS 28G MISC: 4 refills | Status: DC

## 2020-10-25 MED ORDER — TRUE METRIX BLOOD GLUCOSE TEST VI STRP: ORAL_STRIP | 12 refills | Status: DC

## 2020-10-25 MED ORDER — TRUE METRIX METER W/DEVICE KIT: PACK | 0 refills | Status: DC

## 2020-10-25 MED ORDER — ATORVASTATIN CALCIUM 20 MG PO TABS: 20.0000 mg | ORAL_TABLET | Freq: Every day | ORAL | 0 refills | Status: DC

## 2020-10-25 NOTE — Progress Notes (Signed)
Please call patient with update.   Kidney function normal.   Liver function normal.   Thyroid function normal.  Hepatitis C negative.   No anemia.   Cholesterol higher than expected. We will start you on a medication Atorvastatin which may help lower cholesterol. High cholesterol may increase risk of heart attack and/or stroke. Consider eating more fruits, vegetables, and lean baked meats such as chicken or fish. Moderate intensity exercise at least 150 minutes as tolerated per week may help as well.   Your hemoglobin A1C is consistent with diabetes. We will start you on a medication Metformin which may help. Practice healthy eating habits, low-carbohydrate diet, low-sugar diet, regular aerobic exercise (at least 150 minutes a week as tolerated) and medication compliance to achieve or maintain control of diabetes.  Also, check your blood sugars at home and write the date and blood sugar reading. Bring this with you to your diabetes checkup in 1 month (please schedule this while the patient is on the phone). A glucometer, test strips, and lancets have been prescribed and sent to your pharmacy.   The most common side effects of Metformin include diarrhea, gas, nausea, vomiting, heartburn, and stomach pain. The most common side effect of Atorvastatin is mild muscle ache. Should you experience any of these side effects or any others from the medications please discontinue medication, notify provider, and seek medical attention.

## 2020-10-25 NOTE — Progress Notes (Signed)
Fasting blood sugars should be 80 - 130.  Blood sugars two hours after meals should be no higher than 180. Do not take Metformin if blood sugar is less than 80.

## 2020-10-25 NOTE — Addendum Note (Signed)
Addended by: Camillia Herter on: 10/25/2020 04:25 PM   Modules accepted: Orders

## 2020-10-27 ENCOUNTER — Ambulatory Visit: Payer: 59 | Admitting: Cardiology

## 2020-10-29 ENCOUNTER — Other Ambulatory Visit: Payer: Self-pay

## 2020-10-29 DIAGNOSIS — M2240 Chondromalacia patellae, unspecified knee: Secondary | ICD-10-CM | POA: Insufficient documentation

## 2020-10-29 DIAGNOSIS — I2699 Other pulmonary embolism without acute cor pulmonale: Secondary | ICD-10-CM | POA: Insufficient documentation

## 2020-10-29 DIAGNOSIS — K648 Other hemorrhoids: Secondary | ICD-10-CM | POA: Insufficient documentation

## 2020-10-29 DIAGNOSIS — R609 Edema, unspecified: Secondary | ICD-10-CM | POA: Insufficient documentation

## 2020-10-29 DIAGNOSIS — M792 Neuralgia and neuritis, unspecified: Secondary | ICD-10-CM | POA: Insufficient documentation

## 2020-10-29 DIAGNOSIS — R109 Unspecified abdominal pain: Secondary | ICD-10-CM | POA: Insufficient documentation

## 2020-10-29 DIAGNOSIS — K921 Melena: Secondary | ICD-10-CM | POA: Insufficient documentation

## 2020-10-29 DIAGNOSIS — M25569 Pain in unspecified knee: Secondary | ICD-10-CM | POA: Insufficient documentation

## 2020-10-29 DIAGNOSIS — R5383 Other fatigue: Secondary | ICD-10-CM | POA: Insufficient documentation

## 2020-10-29 DIAGNOSIS — I429 Cardiomyopathy, unspecified: Secondary | ICD-10-CM | POA: Insufficient documentation

## 2020-10-29 DIAGNOSIS — I10 Essential (primary) hypertension: Secondary | ICD-10-CM | POA: Insufficient documentation

## 2020-10-29 DIAGNOSIS — G43909 Migraine, unspecified, not intractable, without status migrainosus: Secondary | ICD-10-CM | POA: Insufficient documentation

## 2020-10-29 DIAGNOSIS — G5760 Lesion of plantar nerve, unspecified lower limb: Secondary | ICD-10-CM | POA: Insufficient documentation

## 2020-10-29 DIAGNOSIS — IMO0001 Reserved for inherently not codable concepts without codable children: Secondary | ICD-10-CM | POA: Insufficient documentation

## 2020-10-29 DIAGNOSIS — J069 Acute upper respiratory infection, unspecified: Secondary | ICD-10-CM | POA: Insufficient documentation

## 2020-10-29 DIAGNOSIS — Z86010 Personal history of colonic polyps: Secondary | ICD-10-CM | POA: Insufficient documentation

## 2020-10-31 ENCOUNTER — Encounter: Payer: Self-pay | Admitting: Cardiology

## 2020-10-31 ENCOUNTER — Other Ambulatory Visit: Payer: Self-pay

## 2020-10-31 ENCOUNTER — Ambulatory Visit (INDEPENDENT_AMBULATORY_CARE_PROVIDER_SITE_OTHER): Payer: No Typology Code available for payment source | Admitting: Cardiology

## 2020-10-31 VITALS — BP 128/80 | HR 67 | Ht 65.0 in | Wt 235.1 lb

## 2020-10-31 DIAGNOSIS — E782 Mixed hyperlipidemia: Secondary | ICD-10-CM | POA: Diagnosis not present

## 2020-10-31 DIAGNOSIS — I10 Essential (primary) hypertension: Secondary | ICD-10-CM

## 2020-10-31 DIAGNOSIS — R079 Chest pain, unspecified: Secondary | ICD-10-CM | POA: Diagnosis not present

## 2020-10-31 DIAGNOSIS — E669 Obesity, unspecified: Secondary | ICD-10-CM

## 2020-10-31 DIAGNOSIS — E0800 Diabetes mellitus due to underlying condition with hyperosmolarity without nonketotic hyperglycemic-hyperosmolar coma (NKHHC): Secondary | ICD-10-CM | POA: Diagnosis not present

## 2020-10-31 DIAGNOSIS — R0600 Dyspnea, unspecified: Secondary | ICD-10-CM

## 2020-10-31 DIAGNOSIS — R0609 Other forms of dyspnea: Secondary | ICD-10-CM

## 2020-10-31 MED ORDER — PREDNISONE 50 MG PO TABS: ORAL_TABLET | ORAL | 0 refills | Status: DC

## 2020-10-31 MED ORDER — METOPROLOL TARTRATE 100 MG PO TABS: 100.0000 mg | ORAL_TABLET | Freq: Once | ORAL | 0 refills | Status: DC

## 2020-10-31 NOTE — Progress Notes (Signed)
Cardiology Office Note:    Date:  10/31/2020   ID:  Ashley Mcconnell, DOB [birth year], MRN [id number]  PCP:  Camillia Herter, NP  Cardiologist:  Berniece Salines, DO  Electrophysiologist:  None   Referring MD: Camillia Herter, NP   " I have been having some chest discomfort and shortness of breath on exertion"  History of Present Illness:    Ashley Mcconnell is a 60 y.o. female with a hx of hypertension, she did tell me that 10 to 12 years ago she was told that she had cardiomyopathy she underwent heart catheterization which was reported to be normal and ever since she had been doing well.  Recently she was diagnosed with diabetes with a hemoglobin A1c of 6.5 she was started on metformin, also diagnosed with hyperlipidemia and started on atorvastatin 20 mg daily.  She states that for several months she has been experiencing intermittent chest pain but at first she thought it was nothing she describes it as a dull chest sensation which is really nonradiating.  But most times when she is stressed it feels more stabbing and shooting sensation.  She admits to associated shortness of breath she has not had any lightheadedness or dizziness.  She has not had any syncope episode.  Her concern is with the intermittent frequent chest pain.  Past Medical History:  Diagnosis Date   Cardiomyopathy [facility])    Hypertension    Phreesia 09/26/2020   Kidney stones    Migraine    Morton's neuroma    L Foot   Pulmonary embolism [facility])     Past Surgical History:  Procedure Laterality Date   ABDOMINAL HYSTERECTOMY     CARDIAC SURGERY  04/2014   Catherization    CHOLECYSTECTOMY     CYSTOSCOPY/URETEROSCOPY/HOLMIUM LASER/STENT PLACEMENT Left 10/10/2018   Procedure: CYSTOSCOPY/URETEROSCOPY/HOLMIUM LASER/STENT PLACEMENT;  Surgeon: Ceasar Mons, MD;  Location: [facility];  Service: Urology;  Laterality: Left;   LITHOTRIPSY     x5   TONSILLECTOMY  1969    Current Medications: Current Meds   Medication Sig   atorvastatin (LIPITOR) 20 MG tablet Take 1 tablet (20 mg total) by mouth daily.   Blood Glucose Monitoring Suppl (TRUE METRIX METER) w/Device KIT Use as directed   glucose blood (TRUE METRIX BLOOD GLUCOSE TEST) test strip Use as instructed   hydrochlorothiazide (HYDRODIURIL) 12.5 MG tablet Take 1 tablet (12.5 mg total) by mouth daily.   metFORMIN (GLUCOPHAGE) 500 MG tablet Take 1 tablet (500 mg total) by mouth daily with breakfast.   metoprolol tartrate (LOPRESSOR) 100 MG tablet Take 1 tablet (100 mg total) by mouth once for 1 dose. 2 hours before ct   omeprazole (PRILOSEC) 20 MG capsule Take 1 capsule (20 mg total) by mouth daily.   ondansetron (ZOFRAN ODT) 4 MG disintegrating tablet Take 1 tablet (4 mg total) by mouth every 8 (eight) hours as needed for nausea or vomiting.   oxybutynin (DITROPAN-XL) 10 MG 24 hr tablet Take 10 mg by mouth daily.   oxyCODONE-acetaminophen (PERCOCET/ROXICET) 5-325 MG tablet Take 1-2 tablets by mouth every 6 (six) hours as needed for severe pain.   predniSONE (DELTASONE) 50 MG tablet Take one tablet 13 hours before ct, Take one tablet 7 hours before ct, take 1 tablet 1 hour before ct.   triamcinolone ointment (KENALOG) 0.1 % Apply 1 application topically 2 (two) times daily as needed (itching).    TRUEplus Lancets 28G MISC Use as directed     Allergies:   Contrast  media [iodinated diagnostic agents], Fish allergy, and Shellfish allergy   Social History   Socioeconomic History   Marital status: Single    Spouse name: Not on file   Number of children: 3   Years of education: College   Highest education level: Not on file  Occupational History    Employer: OTHER    Comment: [employer].   Tobacco Use   Smoking status: Former Smoker    Packs/day: 0.20    Years: 2.00    Pack years: 0.40    Types: Cigarettes    Quit date: 07/24/1986    Years since quitting: 34.2   Smokeless tobacco: Never Used   Tobacco  comment: social smoker  Substance and Sexual Activity   Alcohol use: Yes    Alcohol/week: 0.0 standard drinks    Comment: 1 drink on New Year's Eve   Drug use: No   Sexual activity: Not Currently  Other Topics Concern   Not on file  Social History Narrative   Patient lives at home with her family.   Caffeine Use: 1 cup of tea two times weekly   Social Determinants of Health   Financial Resource Strain: Not on file  Food Insecurity: Not on file  Transportation Needs: Not on file  Physical Activity: Not on file  Stress: Not on file  Social Connections: Not on file     Family History: The patient's family history includes Alzheimer's disease in her paternal aunt, paternal aunt, paternal aunt, and paternal uncle; Breast cancer in her mother; Heart Problems in her father and paternal grandmother; Stroke in her father.  ROS:   Review of Systems  Constitution: Negative for decreased appetite, fever and weight gain.  HENT: Negative for congestion, ear discharge, hoarse voice and sore throat.   Eyes: Negative for discharge, redness, vision loss in right eye and visual halos.  Cardiovascular: Reports chest pain, dyspnea on exertion.  Negative for leg swelling, orthopnea and palpitations.  Respiratory: Negative for cough, hemoptysis, shortness of breath and snoring.   Endocrine: Negative for heat intolerance and polyphagia.  Hematologic/Lymphatic: Negative for bleeding problem. Does not bruise/bleed easily.  Skin: Negative for flushing, nail changes, rash and suspicious lesions.  Musculoskeletal: Negative for arthritis, joint pain, muscle cramps, myalgias, neck pain and stiffness.  Gastrointestinal: Negative for abdominal pain, bowel incontinence, diarrhea and excessive appetite.  Genitourinary: Negative for decreased libido, genital sores and incomplete emptying.  Neurological: Negative for brief paralysis, focal weakness, headaches and loss of balance.  Psychiatric/Behavioral:  Negative for altered mental status, depression and suicidal ideas.  Allergic/Immunologic: Negative for HIV exposure and persistent infections.    EKGs/Labs/Other Studies Reviewed:    The following studies were reviewed today:   EKG:  The ekg ordered today demonstrates sinus rhythm, heart rate 67 bpm  Recent Labs: 10/24/2020: ALT 16; BUN 23; Creatinine, Ser 0.77; Hemoglobin 14.1; Platelets 308; Potassium 3.6; Sodium 140; TSH 2.360  Recent Lipid Panel    Component Value Date/Time   CHOL 223 (H) 10/24/2020 1616   TRIG 179 (H) 10/24/2020 1616   HDL 53 10/24/2020 1616   CHOLHDL 4.2 10/24/2020 1616   LDLCALC 138 (H) 10/24/2020 1616    Physical Exam:    VS:  BP 128/80 (BP Location: Right Arm)    Pulse 67    Ht _0  (1.651 m)    Wt 235 lb 1.6 oz (106.6 kg)    SpO2 98%    BMI 39.12 kg/m     Wt Readings from  Last 3 Encounters:  10/31/20 235 lb 1.6 oz (106.6 kg)  10/24/20 233 lb 6.4 oz (105.9 kg)  09/26/20 229 lb (103.9 kg)     GEN: Well nourished, well developed in no acute distress HEENT: Normal NECK: No JVD; No carotid bruits LYMPHATICS: No lymphadenopathy CARDIAC: S1S2 noted,RRR, no murmurs, rubs, gallops RESPIRATORY:  Clear to auscultation without rales, wheezing or rhonchi  ABDOMEN: Soft, non-tender, non-distended, +bowel sounds, no guarding. EXTREMITIES: No edema, No cyanosis, no clubbing MUSCULOSKELETAL:  No deformity  SKIN: Warm and dry NEUROLOGIC:  Alert and oriented x 3, non-focal PSYCHIATRIC:  Normal affect, good insight  ASSESSMENT:    1. Chest pain of uncertain etiology   2. Diabetes mellitus due to underlying condition with hyperosmolarity without coma, without long-term current use of insulin ([place])   3. Mixed hyperlipidemia   4. Benign essential hypertension   5. Obesity (BMI 30-39.9)   6. Dyspnea on exertion    PLAN:     Her chest pain is concerning patient does have intermediate risk for coronary artery disease would not like to do is pursue an  ischemic evaluation in this patient.  Shared decision a coronary CTA at this time is appropriate.  I have discussed with the patient about the testing.  The patient has no IV contrast allergy and is agreeable to proceed with this test.  In addition I am concerned about her shortness of breath and a history of cardiomyopathy I like to repeat her echocardiogram to assess for any LV dysfunction and other structural abnormalities.  Her blood pressure deceptively in the office today.  No changes will be made to her antihypertensive regimen which includes hydrochlorothiazide 12.5 mg daily.  This is being managed by his primary care doctor.  No adjustments for antidiabetic medications were made today.  Hyperlipidemia - continue with current statin medication.  The patient is in agreement with the above plan. The patient left the office in stable condition.  The patient will follow up in   Medication Adjustments/Labs and Tests Ordered: Current medicines are reviewed at length with the patient today.  Concerns regarding medicines are outlined above.  Orders Placed This Encounter  Procedures   CT CORONARY MORPH W/CTA COR W/SCORE W/CA W/CM &/OR WO/CM   CT CORONARY FRACTIONAL FLOW RESERVE DATA PREP   CT CORONARY FRACTIONAL FLOW RESERVE FLUID ANALYSIS   Basic metabolic panel   EKG [address]   ECHOCARDIOGRAM COMPLETE   Meds ordered this encounter  Medications   metoprolol tartrate (LOPRESSOR) 100 MG tablet    Sig: Take 1 tablet (100 mg total) by mouth once for 1 dose. 2 hours before ct    Dispense:  1 tablet    Refill:  0   predniSONE (DELTASONE) 50 MG tablet    Sig: Take one tablet 13 hours before ct, Take one tablet 7 hours before ct, take 1 tablet 1 hour before ct.    Dispense:  3 tablet    Refill:  0    Patient Instructions   Medication Instructions:  Your physician recommends that you continue on your current medications as directed. Please refer to the Current Medication list  given to you today.  *If you need a refill on your cardiac medications before your next appointment, please call your pharmacy*   Lab Work: Your physician recommends that you return for lab work 3-7 days before ct: bmp   If you have labs (blood work) drawn today and your tests are completely normal, you will receive your results  only by:  [employer] (if you have MyChart) OR  A paper copy in the mail If you have any lab test that is abnormal or we need to change your treatment, we will call you to review the results.   Testing/Procedures: Your physician has requested that you have an echocardiogram. Echocardiography is a painless test that uses sound waves to create images of your heart. It provides your doctor with information about the size and shape of your heart and how well your hearts chambers and valves are working. This procedure takes approximately one hour. There are no restrictions for this procedure.  Your cardiac CT will be scheduled at one of the below locations:   [facility] [address] [place], [place] [address] [phone]  [place] [address] [place],  [address] [phone]  If scheduled at [facility], please arrive at the [facility] main entrance of [facility] 30 minutes prior to test start time. Proceed to the [facility] Radiology Department (first floor) to check-in and test prep.  If scheduled at [facility], please arrive 15 mins early for check-in and test prep.  Please follow these instructions carefully (unless otherwise directed):  On the Night Before the Test:  Be sure to Drink plenty of water.  Do not consume any caffeinated/decaffeinated beverages or chocolate 12 hours prior to your test.  Do not take any antihistamines 12 hours prior to your test.  If the patient has contrast allergy: ? Patient will need a  prescription for Prednisone and very clear instructions (as follows): 1. Prednisone 50 mg - take 13 hours prior to test 2. Take another Prednisone 50 mg 7 hours prior to test 3. Take another Prednisone 50 mg 1 hour prior to test 4. Take Benadryl 50 mg 1 hour prior to test  Patient must complete all four doses of above prophylactic medications.  Patient will need a ride after test due to Benadryl.  On the Day of the Test:  Drink plenty of water. Do not drink any water within one hour of the test.  Do not eat any food 4 hours prior to the test.  You may take your regular medications prior to the test.   Take metoprolol (Lopressor) two hours prior to test.  HOLD Hydrochlorothiazide morning of the test.  FEMALES- please wear underwire-free bra if available         After the Test:  Drink plenty of water.  After receiving IV contrast, you may experience a mild flushed feeling. This is normal.  On occasion, you may experience a mild rash up to 24 hours after the test. This is not dangerous. If this occurs, you can take Benadryl 25 mg and increase your fluid intake.  If you experience trouble breathing, this can be serious. If it is severe call 911 IMMEDIATELY. If it is mild, please call our office.  If you take any of these medications: Glipizide/Metformin, Avandament, Glucavance, please do not take 48 hours after completing test unless otherwise instructed.   Once we have confirmed authorization from your insurance company, we will call you to set up a date and time for your test. Based on how quickly your insurance processes prior authorizations requests, please allow up to 4 weeks to be contacted for scheduling your Cardiac CT appointment. Be advised that routine Cardiac CT appointments could be scheduled as many as 8 weeks after your provider has ordered  it.  For non-scheduling related questions, please contact the cardiac imaging nurse navigator should you have any  questions/concerns: Marchia Bond, Cardiac Imaging Nurse Navigator Burley Saver, Interim Cardiac Imaging Nurse [facility] Vascular Services Direct Office Dial: [phone]   For scheduling needs, including cancellations and rescheduling, please call [place], [phone].     Follow-Up: At [facility], you and your health needs are our priority.  As part of our continuing mission to provide you with exceptional heart care, we have created designated Provider Care Teams.  These Care Teams include your primary Cardiologist (physician) and Advanced Practice Providers (APPs -  Physician Assistants and Nurse Practitioners) who all work together to provide you with the care you need, when you need it.  We recommend signing up for the patient portal called "MyChart".  Sign up information is provided on this After Visit Summary.  MyChart is used to connect with patients for Virtual Visits (Telemedicine).  Patients are able to view lab/test results, encounter notes, upcoming appointments, etc.  Non-urgent messages can be sent to your provider as well.   To learn more about what you can do with MyChart, go to [email or web address].    Your next appointment:   3 month(s)  The format for your next appointment:   In Person  Provider:   Berniece Salines, DO   Other Instructions   Echocardiogram An echocardiogram is a test that uses sound waves (ultrasound) to produce images of the heart. Images from an echocardiogram can provide important information about:  Heart size and shape.  The size and thickness and movement of your heart's walls.  Heart muscle function and strength.  Heart valve function or if you have stenosis. Stenosis is when the heart valves are too narrow.  If blood is flowing backward through the heart valves (regurgitation).  A tumor or infectious growth around the heart valves.  Areas of heart muscle that are not working well because of poor blood  flow or injury from a heart attack.  Aneurysm detection. An aneurysm is a weak or damaged part of an artery wall. The wall bulges out from the normal force of blood pumping through the body. Tell a health care provider about:  Any allergies you have.  All medicines you are taking, including vitamins, herbs, eye drops, creams, and over-the-counter medicines.  Any blood disorders you have.  Any surgeries you have had.  Any medical conditions you have.  Whether you are pregnant or may be pregnant. What are the risks? Generally, this is a safe test. However, problems may occur, including an allergic reaction to dye (contrast) that may be used during the test. What happens before the test? No specific preparation is needed. You may eat and drink normally. What happens during the test?  You will take off your clothes from the waist up and put on a hospital gown.  Electrodes or electrocardiogram (ECG)patches may be placed on your chest. The electrodes or patches are then connected to a device that monitors your heart rate and rhythm.  You will lie down on a table for an ultrasound exam. A gel will be applied to your chest to help sound waves pass through your skin.  A handheld device, called a transducer, will be pressed against your chest and moved over your heart. The transducer produces sound waves that travel to your heart and bounce back (or "echo" back) to the transducer. These sound waves will be captured in real-time and changed into images of your  heart that can be viewed on a video monitor. The images will be recorded on a computer and reviewed by your health care provider.  You may be asked to change positions or hold your breath for a short time. This makes it easier to get different views or better views of your heart.  In some cases, you may receive contrast through an IV in one of your veins. This can improve the quality of the pictures from your heart. The procedure may vary  among health care providers and hospitals.   What can I expect after the test? You may return to your normal, everyday life, including diet, activities, and medicines, unless your health care provider tells you not to do that. Follow these instructions at home:  It is up to you to get the results of your test. Ask your health care provider, or the department that is doing the test, when your results will be ready.  Keep all follow-up visits. This is important. Summary  An echocardiogram is a test that uses sound waves (ultrasound) to produce images of the heart.  Images from an echocardiogram can provide important information about the size and shape of your heart, heart muscle function, heart valve function, and other possible heart problems.  You do not need to do anything to prepare before this test. You may eat and drink normally.  After the echocardiogram is completed, you may return to your normal, everyday life, unless your health care provider tells you not to do that. This information is not intended to replace advice given to you by your health care provider. Make sure you discuss any questions you have with your health care provider. Document Revised: 05/20/2020 Document Reviewed: 05/20/2020 Elsevier Patient Education  2021 [place].   Cardiac CT Angiogram A cardiac CT angiogram is a procedure to look at the heart and the area around the heart. It may be done to help find the cause of chest pains or other symptoms of heart disease. During this procedure, a substance called contrast dye is injected into the blood vessels in the area to be checked. A large X-ray machine, called a CT scanner, then takes detailed pictures of the heart and the surrounding area. The procedure is also sometimes called a coronary CT angiogram, coronary artery scanning, or CTA. A cardiac CT angiogram allows the health care provider to see how well blood is flowing to and from the heart. The health care  provider will be able to see if there are any problems, such as:  Blockage or narrowing of the coronary arteries in the heart.  Fluid around the heart.  Signs of weakness or disease in the muscles, valves, and tissues of the heart. Tell a health care provider about:  Any allergies you have. This is especially important if you have had a previous allergic reaction to contrast dye.  All medicines you are taking, including vitamins, herbs, eye drops, creams, and over-the-counter medicines.  Any blood disorders you have.  Any surgeries you have had.  Any medical conditions you have.  Whether you are pregnant or may be pregnant.  Any anxiety disorders, chronic pain, or other conditions you have that may increase your stress or prevent you from lying still. What are the risks? Generally, this is a safe procedure. However, problems may occur, including:  Bleeding.  Infection.  Allergic reactions to medicines or dyes.  Damage to other structures or organs.  Kidney damage from the contrast dye that is used.  Increased risk of cancer from radiation exposure. This risk is low. Talk with your health care provider about: ? The risks and benefits of testing. ? How you can receive the lowest dose of radiation. What happens before the procedure?  Wear comfortable clothing and remove any jewelry, glasses, dentures, and hearing aids.  Follow instructions from your health care provider about eating and drinking. This may include: ? For 12 hours before the procedure -- avoid caffeine. This includes tea, coffee, soda, energy drinks, and diet pills. Drink plenty of water or other fluids that do not have caffeine in them. Being well hydrated can prevent complications. ? For 4-6 hours before the procedure -- stop eating and drinking. The contrast dye can cause nausea, but this is less likely if your stomach is empty.  Ask your health care provider about changing or stopping your regular  medicines. This is especially important if you are taking diabetes medicines, blood thinners, or medicines to treat problems with erections (erectile dysfunction). What happens during the procedure?  Hair on your chest may need to be removed so that small sticky patches called electrodes can be placed on your chest. These will transmit information that helps to monitor your heart during the procedure.  An IV will be inserted into one of your veins.  You might be given a medicine to control your heart rate during the procedure. This will help to ensure that good images are obtained.  You will be asked to lie on an exam table. This table will slide in and out of the CT machine during the procedure.  Contrast dye will be injected into the IV. You might feel warm, or you may get a metallic taste in your mouth.  You will be given a medicine called nitroglycerin. This will relax or dilate the arteries in your heart.  The table that you are lying on will move into the CT machine tunnel for the scan.  The person running the machine will give you instructions while the scans are being done. You may be asked to: ? Keep your arms above your head. ? Hold your breath. ? Stay very still, even if the table is moving.  When the scanning is complete, you will be moved out of the machine.  The IV will be removed. The procedure may vary among health care providers and hospitals.   What can I expect after the procedure? After your procedure, it is common to have:  A metallic taste in your mouth from the contrast dye.  A feeling of warmth.  A headache from the nitroglycerin. Follow these instructions at home:  Take over-the-counter and prescription medicines only as told by your health care provider.  If you are told, drink enough fluid to keep your urine pale yellow. This will help to flush the contrast dye out of your body.  Most people can return to their normal activities right after the  procedure. Ask your health care provider what activities are safe for you.  It is up to you to get the results of your procedure. Ask your health care provider, or the department that is doing the procedure, when your results will be ready.  Keep all follow-up visits as told by your health care provider. This is important. Contact a health care provider if:  You have any symptoms of allergy to the contrast dye. These include: ? Shortness of breath. ? Rash or hives. ? A racing heartbeat. Summary  A cardiac CT angiogram is a  procedure to look at the heart and the area around the heart. It may be done to help find the cause of chest pains or other symptoms of heart disease.  During this procedure, a large X-ray machine, called a CT scanner, takes detailed pictures of the heart and the surrounding area after a contrast dye has been injected into blood vessels in the area.  Ask your health care provider about changing or stopping your regular medicines before the procedure. This is especially important if you are taking diabetes medicines, blood thinners, or medicines to treat erectile dysfunction.  If you are told, drink enough fluid to keep your urine pale yellow. This will help to flush the contrast dye out of your body. This information is not intended to replace advice given to you by your health care provider. Make sure you discuss any questions you have with your health care provider. Document Revised: 05/23/2019 Document Reviewed: 05/23/2019 Elsevier Patient Education  2021 [place].     Adopting a Healthy Lifestyle.  Know what a healthy weight is for you (roughly BMI <25) and aim to maintain this   Aim for 7+ servings of fruits and vegetables daily   65-80+ fluid ounces of water or unsweet tea for healthy kidneys   Limit to max 1 drink of alcohol per day; avoid smoking/tobacco   Limit animal fats in diet for cholesterol and heart health - choose grass fed whenever  available   Avoid highly processed foods, and foods high in saturated/trans fats   Aim for low stress - take time to unwind and care for your mental health   Aim for 150 min of moderate intensity exercise weekly for heart health, and weights twice weekly for bone health   Aim for 7-9 hours of sleep daily   When it comes to diets, agreement about the perfect plan isnt easy to find, even among the experts. Experts at the [employer] developed an idea known as the Healthy Eating Plate. Just imagine a plate divided into logical, healthy portions.   The emphasis is on diet quality:   Load up on vegetables and fruits - one-half of your plate: Aim for color and variety, and remember that potatoes dont count.   Go for whole grains - one-quarter of your plate: Whole wheat, barley, wheat berries, quinoa, oats, brown rice, and foods made with them. If you want pasta, go with whole wheat pasta.   Protein power - one-quarter of your plate: Fish, chicken, beans, and nuts are all healthy, versatile protein sources. Limit red meat.   The diet, however, does go beyond the plate, offering a few other suggestions.   Use healthy plant oils, such as olive, canola, soy, corn, sunflower and peanut. Check the labels, and avoid partially hydrogenated oil, which have unhealthy trans fats.   If youre thirsty, drink water. Coffee and tea are good in moderation, but skip sugary drinks and limit milk and dairy products to one or two daily servings.   The type of carbohydrate in the diet is more important than the amount. Some sources of carbohydrates, such as vegetables, fruits, whole grains, and beans-are healthier than others.   Finally, stay active  Signed, Berniece Salines, DO  10/31/2020 3:19 PM    [facility]

## 2020-10-31 NOTE — Patient Instructions (Addendum)
Medication Instructions:  Your physician recommends that you continue on your current medications as directed. Please refer to the Current Medication list given to you today.  *If you need a refill on your cardiac medications before your next appointment, please call your pharmacy*   Lab Work: Your physician recommends that you return for lab work 3-7 days before ct: bmp   If you have labs (blood work) drawn today and your tests are completely normal, you will receive your results only by: Marland Kitchen MyChart Message (if you have MyChart) OR . A paper copy in the mail If you have any lab test that is abnormal or we need to change your treatment, we will call you to review the results.   Testing/Procedures: Your physician has requested that you have an echocardiogram. Echocardiography is a painless test that uses sound waves to create images of your heart. It provides your doctor with information about the size and shape of your heart and how well your heart's chambers and valves are working. This procedure takes approximately one hour. There are no restrictions for this procedure.  Your cardiac CT will be scheduled at one of the below locations:   [facility] [address] [place], [place] [address] [phone]  [place] [address] [place], [place] [address] [phone]  If scheduled at [facility], please arrive at the [facility] main entrance of [facility] 30 minutes prior to test start time. Proceed to the [facility] Radiology Department (first floor) to check-in and test prep.  If scheduled at [facility], please arrive 15 mins early for check-in and test prep.  Please follow these instructions carefully (unless otherwise directed):  On the Night Before the Test: . Be sure to Drink plenty of water. . Do not consume any caffeinated/decaffeinated beverages or chocolate  12 hours prior to your test. . Do not take any antihistamines 12 hours prior to your test. . If the patient has contrast allergy: ? Patient will need a prescription for Prednisone and very clear instructions (as follows): 1. Prednisone 50 mg - take 13 hours prior to test 2. Take another Prednisone 50 mg 7 hours prior to test 3. Take another Prednisone 50 mg 1 hour prior to test 4. Take Benadryl 50 mg 1 hour prior to test . Patient must complete all four doses of above prophylactic medications. . Patient will need a ride after test due to Benadryl.  On the Day of the Test: . Drink plenty of water. Do not drink any water within one hour of the test. . Do not eat any food 4 hours prior to the test. . You may take your regular medications prior to the test.  . Take metoprolol (Lopressor) two hours prior to test. . HOLD Hydrochlorothiazide morning of the test. . FEMALES- please wear underwire-free bra if available         After the Test: . Drink plenty of water. . After receiving IV contrast, you may experience a mild flushed feeling. This is normal. . On occasion, you may experience a mild rash up to 24 hours after the test. This is not dangerous. If this occurs, you can take Benadryl 25 mg and increase your fluid intake. . If you experience trouble breathing, this can be serious. If it is severe call 911 IMMEDIATELY. If it is mild, please call our office. . If you take any of these medications: Glipizide/Metformin, Avandament,  Glucavance, please do not take 48 hours after completing test unless otherwise instructed.   Once we have confirmed authorization from your insurance company, we will call you to set up a date and time for your test. Based on how quickly your insurance processes prior authorizations requests, please allow up to 4 weeks to be contacted for scheduling your Cardiac CT appointment. Be advised that routine Cardiac CT appointments could be scheduled as many as 8 weeks  after your provider has ordered it.  For non-scheduling related questions, please contact the cardiac imaging nurse navigator should you have any questions/concerns: Marchia Bond, Cardiac Imaging Nurse Navigator Burley Saver, Interim Cardiac Imaging Nurse [facility] Direct Office Dial: [phone]   For scheduling needs, including cancellations and rescheduling, please call [place], [phone].     Follow-Up: At [facility], you and your health needs are our priority.  As part of our continuing mission to provide you with exceptional heart care, we have created designated Provider Care Teams.  These Care Teams include your primary Cardiologist (physician) and Advanced Practice Providers (APPs -  Physician Assistants and Nurse Practitioners) who all work together to provide you with the care you need, when you need it.  We recommend signing up for the patient portal called "MyChart".  Sign up information is provided on this After Visit Summary.  MyChart is used to connect with patients for Virtual Visits (Telemedicine).  Patients are able to view lab/test results, encounter notes, upcoming appointments, etc.  Non-urgent messages can be sent to your provider as well.   To learn more about what you can do with MyChart, go to [email or web address].    Your next appointment:   3 month(s)  The format for your next appointment:   In Person  Provider:   Berniece Salines, DO   Other Instructions   Echocardiogram An echocardiogram is a test that uses sound waves (ultrasound) to produce images of the heart. Images from an echocardiogram can provide important information about:  Heart size and shape.  The size and thickness and movement of your heart's walls.  Heart muscle function and strength.  Heart valve function or if you have stenosis. Stenosis is when the heart valves are too narrow.  If blood is flowing backward through the heart valves  (regurgitation).  A tumor or infectious growth around the heart valves.  Areas of heart muscle that are not working well because of poor blood flow or injury from a heart attack.  Aneurysm detection. An aneurysm is a weak or damaged part of an artery wall. The wall bulges out from the normal force of blood pumping through the body. Tell a health care provider about:  Any allergies you have.  All medicines you are taking, including vitamins, herbs, eye drops, creams, and over-the-counter medicines.  Any blood disorders you have.  Any surgeries you have had.  Any medical conditions you have.  Whether you are pregnant or may be pregnant. What are the risks? Generally, this is a safe test. However, problems may occur, including an allergic reaction to dye (contrast) that may be used during the test. What happens before the test? No specific preparation is needed. You may eat and drink normally. What happens during the test?  You will take off your clothes from the waist up and put on a hospital gown.  Electrodes or electrocardiogram (ECG)patches may be placed on your chest. The electrodes or patches are then connected to a device that monitors your  heart rate and rhythm.  You will lie down on a table for an ultrasound exam. A gel will be applied to your chest to help sound waves pass through your skin.  A handheld device, called a transducer, will be pressed against your chest and moved over your heart. The transducer produces sound waves that travel to your heart and bounce back (or "echo" back) to the transducer. These sound waves will be captured in real-time and changed into images of your heart that can be viewed on a video monitor. The images will be recorded on a computer and reviewed by your health care provider.  You may be asked to change positions or hold your breath for a short time. This makes it easier to get different views or better views of your heart.  In some cases,  you may receive contrast through an IV in one of your veins. This can improve the quality of the pictures from your heart. The procedure may vary among health care providers and hospitals.   What can I expect after the test? You may return to your normal, everyday life, including diet, activities, and medicines, unless your health care provider tells you not to do that. Follow these instructions at home:  It is up to you to get the results of your test. Ask your health care provider, or the department that is doing the test, when your results will be ready.  Keep all follow-up visits. This is important. Summary  An echocardiogram is a test that uses sound waves (ultrasound) to produce images of the heart.  Images from an echocardiogram can provide important information about the size and shape of your heart, heart muscle function, heart valve function, and other possible heart problems.  You do not need to do anything to prepare before this test. You may eat and drink normally.  After the echocardiogram is completed, you may return to your normal, everyday life, unless your health care provider tells you not to do that. This information is not intended to replace advice given to you by your health care provider. Make sure you discuss any questions you have with your health care provider. Document Revised: 05/20/2020 Document Reviewed: 05/20/2020 Elsevier Patient Education  2021 [place].   Cardiac CT Angiogram A cardiac CT angiogram is a procedure to look at the heart and the area around the heart. It may be done to help find the cause of chest pains or other symptoms of heart disease. During this procedure, a substance called contrast dye is injected into the blood vessels in the area to be checked. A large X-ray machine, called a CT scanner, then takes detailed pictures of the heart and the surrounding area. The procedure is also sometimes called a coronary CT angiogram, coronary  artery scanning, or CTA. A cardiac CT angiogram allows the health care provider to see how well blood is flowing to and from the heart. The health care provider will be able to see if there are any problems, such as:  Blockage or narrowing of the coronary arteries in the heart.  Fluid around the heart.  Signs of weakness or disease in the muscles, valves, and tissues of the heart. Tell a health care provider about:  Any allergies you have. This is especially important if you have had a previous allergic reaction to contrast dye.  All medicines you are taking, including vitamins, herbs, eye drops, creams, and over-the-counter medicines.  Any blood disorders you have.  Any surgeries you  have had.  Any medical conditions you have.  Whether you are pregnant or may be pregnant.  Any anxiety disorders, chronic pain, or other conditions you have that may increase your stress or prevent you from lying still. What are the risks? Generally, this is a safe procedure. However, problems may occur, including:  Bleeding.  Infection.  Allergic reactions to medicines or dyes.  Damage to other structures or organs.  Kidney damage from the contrast dye that is used.  Increased risk of cancer from radiation exposure. This risk is low. Talk with your health care provider about: ? The risks and benefits of testing. ? How you can receive the lowest dose of radiation. What happens before the procedure?  Wear comfortable clothing and remove any jewelry, glasses, dentures, and hearing aids.  Follow instructions from your health care provider about eating and drinking. This may include: ? For 12 hours before the procedure -- avoid caffeine. This includes tea, coffee, soda, energy drinks, and diet pills. Drink plenty of water or other fluids that do not have caffeine in them. Being well hydrated can prevent complications. ? For 4-6 hours before the procedure -- stop eating and drinking. The contrast  dye can cause nausea, but this is less likely if your stomach is empty.  Ask your health care provider about changing or stopping your regular medicines. This is especially important if you are taking diabetes medicines, blood thinners, or medicines to treat problems with erections (erectile dysfunction). What happens during the procedure?  Hair on your chest may need to be removed so that small sticky patches called electrodes can be placed on your chest. These will transmit information that helps to monitor your heart during the procedure.  An IV will be inserted into one of your veins.  You might be given a medicine to control your heart rate during the procedure. This will help to ensure that good images are obtained.  You will be asked to lie on an exam table. This table will slide in and out of the CT machine during the procedure.  Contrast dye will be injected into the IV. You might feel warm, or you may get a metallic taste in your mouth.  You will be given a medicine called nitroglycerin. This will relax or dilate the arteries in your heart.  The table that you are lying on will move into the CT machine tunnel for the scan.  The person running the machine will give you instructions while the scans are being done. You may be asked to: ? Keep your arms above your head. ? Hold your breath. ? Stay very still, even if the table is moving.  When the scanning is complete, you will be moved out of the machine.  The IV will be removed. The procedure may vary among health care providers and hospitals.   What can I expect after the procedure? After your procedure, it is common to have:  A metallic taste in your mouth from the contrast dye.  A feeling of warmth.  A headache from the nitroglycerin. Follow these instructions at home:  Take over-the-counter and prescription medicines only as told by your health care provider.  If you are told, drink enough fluid to keep your urine  pale yellow. This will help to flush the contrast dye out of your body.  Most people can return to their normal activities right after the procedure. Ask your health care provider what activities are safe for you.  It is  up to you to get the results of your procedure. Ask your health care provider, or the department that is doing the procedure, when your results will be ready.  Keep all follow-up visits as told by your health care provider. This is important. Contact a health care provider if:  You have any symptoms of allergy to the contrast dye. These include: ? Shortness of breath. ? Rash or hives. ? A racing heartbeat. Summary  A cardiac CT angiogram is a procedure to look at the heart and the area around the heart. It may be done to help find the cause of chest pains or other symptoms of heart disease.  During this procedure, a large X-ray machine, called a CT scanner, takes detailed pictures of the heart and the surrounding area after a contrast dye has been injected into blood vessels in the area.  Ask your health care provider about changing or stopping your regular medicines before the procedure. This is especially important if you are taking diabetes medicines, blood thinners, or medicines to treat erectile dysfunction.  If you are told, drink enough fluid to keep your urine pale yellow. This will help to flush the contrast dye out of your body. This information is not intended to replace advice given to you by your health care provider. Make sure you discuss any questions you have with your health care provider. Document Revised: 05/23/2019 Document Reviewed: 05/23/2019 Elsevier Patient Education  2021 [employer].

## 2020-11-05 ENCOUNTER — Encounter: Payer: Self-pay | Admitting: Family

## 2020-11-19 ENCOUNTER — Telehealth (HOSPITAL_COMMUNITY): Payer: Self-pay | Admitting: *Deleted

## 2020-11-19 NOTE — Telephone Encounter (Signed)
Reaching out to patient to offer assistance regarding upcoming cardiac imaging study; pt verbalizes understanding of appt date/time, parking situation and where to check in, pre-test NPO status and medications ordered, and verified current allergies; name and call back number provided for further questions should they arise  [facility] [phone] office [phone] cell  Pt verbalized understanding of how take 13 hour prep along with metoprolol tartrate.

## 2020-11-21 ENCOUNTER — Other Ambulatory Visit: Payer: Self-pay

## 2020-11-21 ENCOUNTER — Ambulatory Visit (HOSPITAL_COMMUNITY)
Admission: RE | Admit: 2020-11-21 | Discharge: 2020-11-21 | Disposition: A | Discharge status: Home / Self Care | Payer: Managed Care, Other (non HMO) | Source: Ambulatory Visit | Attending: Cardiology | Admitting: Cardiology

## 2020-11-21 DIAGNOSIS — R079 Chest pain, unspecified: Secondary | ICD-10-CM | POA: Diagnosis present

## 2020-11-21 MED ORDER — NITROGLYCERIN 0.4 MG SL SUBL
0.8000 mg | SUBLINGUAL_TABLET | Freq: Once | SUBLINGUAL | Status: AC
  Administered 2020-11-21: 0.8 mg via SUBLINGUAL

## 2020-11-21 MED ORDER — NITROGLYCERIN 0.4 MG SL SUBL
SUBLINGUAL_TABLET | SUBLINGUAL | Status: AC
  Filled 2020-11-21: qty 2

## 2020-11-21 MED ORDER — METOPROLOL TARTRATE 5 MG/5ML IV SOLN
INTRAVENOUS | Status: AC
  Filled 2020-11-21: qty 5

## 2020-11-21 MED ORDER — IOHEXOL 350 MG/ML SOLN
160.0000 mL | Freq: Once | INTRAVENOUS | Status: AC | PRN
  Administered 2020-11-21: 160 mL via INTRAVENOUS

## 2020-11-21 NOTE — Progress Notes (Signed)
2 peripheral IV attempts per P Dhers RN and 2 attempts per Kriste Basque RN. One ultrasound attempt per Geroge Baseman RN. Patient wants to proceed with IV attempts as has taken her 13hr prep as wll as Metoprolol. Will place IV team consult

## 2020-11-24 ENCOUNTER — Other Ambulatory Visit: Payer: Self-pay

## 2020-11-24 ENCOUNTER — Ambulatory Visit (HOSPITAL_BASED_OUTPATIENT_CLINIC_OR_DEPARTMENT_OTHER)
Admission: RE | Admit: 2020-11-24 | Discharge: 2020-11-24 | Disposition: A | Discharge status: Home / Self Care | Payer: Managed Care, Other (non HMO) | Source: Ambulatory Visit | Attending: Cardiology | Admitting: Cardiology

## 2020-11-24 DIAGNOSIS — R079 Chest pain, unspecified: Secondary | ICD-10-CM

## 2020-11-24 LAB — ECHOCARDIOGRAM COMPLETE: Area-P 1/2: 4.17 cm2

## 2020-11-24 MED ORDER — PERFLUTREN LIPID MICROSPHERE
1.0000 mL | INTRAVENOUS | Status: AC | PRN
Start: 2020-11-24 — End: 2020-11-24
  Administered 2020-11-24: 2 mL via INTRAVENOUS

## 2020-11-25 ENCOUNTER — Ambulatory Visit (HOSPITAL_COMMUNITY)
Admission: RE | Admit: 2020-11-25 | Discharge: 2020-11-25 | Disposition: A | Discharge status: Home / Self Care | Payer: Managed Care, Other (non HMO) | Source: Ambulatory Visit | Attending: Cardiology | Admitting: Cardiology

## 2020-11-25 ENCOUNTER — Encounter: Payer: Self-pay | Admitting: Family

## 2020-11-25 ENCOUNTER — Ambulatory Visit (INDEPENDENT_AMBULATORY_CARE_PROVIDER_SITE_OTHER): Payer: No Typology Code available for payment source | Admitting: Family

## 2020-11-25 VITALS — BP 133/86 | HR 99 | Wt 231.8 lb

## 2020-11-25 DIAGNOSIS — N3289 Other specified disorders of bladder: Secondary | ICD-10-CM

## 2020-11-25 DIAGNOSIS — Z1231 Encounter for screening mammogram for malignant neoplasm of breast: Secondary | ICD-10-CM

## 2020-11-25 DIAGNOSIS — R079 Chest pain, unspecified: Secondary | ICD-10-CM | POA: Insufficient documentation

## 2020-11-25 DIAGNOSIS — Z124 Encounter for screening for malignant neoplasm of cervix: Secondary | ICD-10-CM

## 2020-11-25 DIAGNOSIS — N3281 Overactive bladder: Secondary | ICD-10-CM

## 2020-11-25 DIAGNOSIS — E119 Type 2 diabetes mellitus without complications: Secondary | ICD-10-CM

## 2020-11-25 MED ORDER — OXYBUTYNIN CHLORIDE ER 10 MG PO TB24: 10.0000 mg | ORAL_TABLET | Freq: Every day | ORAL | 0 refills | Status: DC

## 2020-11-25 NOTE — Patient Instructions (Addendum)
Continue Metformin for diabetes. Follow-up for diabetes checkup in April 2022 or sooner if needed. Continue to check blood sugars at home and log results. Fasting goal 80 - 130 and two hours after meals no higher than 180.  Continue Ditropan for bladder spasms.  Appointment for mammogram scheduled by nurse during today's visit.    Referral to Gynecology for PAP Smear.   Please reach out to provider with any concerns or questions.    Type 2 Diabetes Mellitus, Diagnosis, Adult Type 2 diabetes (type 2 diabetes mellitus) is a long-term, or chronic, disease. In type 2 diabetes, one or both of these problems may be present:  The pancreas does not make enough of a hormone called insulin.  Cells in the body do not respond properly to insulin that the body makes (insulin resistance). Normally, insulin allows blood sugar (glucose) to enter cells in the body. The cells use glucose for energy. Insulin resistance or lack of insulin causes excess glucose to build up in the blood instead of going into cells. This causes high blood glucose (hyperglycemia).  What are the causes? The exact cause of type 2 diabetes is not known. What increases the risk? The following factors may make you more likely to develop this condition:  Having a family member with type 2 diabetes.  Being overweight or obese.  Being inactive (sedentary).  Having been diagnosed with insulin resistance.  Having a history of prediabetes, diabetes when you were pregnant (gestational diabetes), or polycystic ovary syndrome (PCOS). What are the signs or symptoms? In the early stage of this condition, you may not have symptoms. Symptoms develop slowly and may include:  Increased thirst or hunger.  Increased urination.  Unexplained weight loss.  Tiredness (fatigue) or weakness.  Vision changes, such as blurry vision.  Dark patches on the skin. How is this diagnosed? This condition is diagnosed based on your symptoms, your  medical history, a physical exam, and your blood glucose level. Your blood glucose may be checked with one or more of the following blood tests:  A fasting blood glucose (FBG) test. You will not be allowed to eat (you will fast) for 8 hours or longer before a blood sample is taken.  A random blood glucose test. This test checks blood glucose at any time of day regardless of when you ate.  An A1C (hemoglobin A1C) blood test. This test provides information about blood glucose levels over the previous 2-3 months.  An oral glucose tolerance test (OGTT). This test measures your blood glucose at two times: ? After fasting. This is your baseline blood glucose level. ? Two hours after drinking a beverage that contains glucose. You may be diagnosed with type 2 diabetes if:  Your fasting blood glucose level is 126 mg/dL (7.0 mmol/L) or higher.  Your random blood glucose level is 200 mg/dL (11.1 mmol/L) or higher.  Your A1C level is 6.5% or higher.  Your oral glucose tolerance test result is higher than 200 mg/dL (11.1 mmol/L). These blood tests may be repeated to confirm your diagnosis.   How is this treated? Your treatment may be managed by a specialist called an endocrinologist. Type 2 diabetes may be treated by following instructions from your health care provider about:  Making dietary and lifestyle changes. These may include: ? Following a personalized nutrition plan that is developed by a registered dietitian. ? Exercising regularly. ? Finding ways to manage stress.  Checking your blood glucose level as often as told.  Taking diabetes  medicines or insulin daily. This helps to keep your blood glucose levels in the healthy range.  Taking medicines to help prevent complications from diabetes. Medicines may include: ? Aspirin. ? Medicine to lower cholesterol. ? Medicine to control blood pressure. Your health care provider will set treatment goals for you. Your goals will be based on your  age, other medical conditions you have, and how you respond to diabetes treatment. Generally, the goal of treatment is to maintain the following blood glucose levels:  Before meals: 80-130 mg/dL (4.4-7.2 mmol/L).  After meals: below 180 mg/dL (10 mmol/L).  A1C level: less than 7%. Follow these instructions at home: Questions to ask your health care provider Consider asking the following questions:  Should I meet with a certified diabetes care and education specialist?  What diabetes medicines do I need, and when should I take them?  What equipment will I need to manage my diabetes at home?  How often do I need to check my blood glucose?  Where can I find a support group for people with diabetes?  What number can I call if I have questions?  When is my next appointment? General instructions  Take over-the-counter and prescription medicines only as told by your health care provider.  Keep all follow-up visits as told by your health care provider. This is important. Where to find more information  American Diabetes Association (ADA): www.diabetes.org  American Association of Diabetes Care and Education Specialists (ADCES): www.diabeteseducator.org  International Diabetes Federation (IDF): MemberVerification.ca Contact a health care provider if:  Your blood glucose is at or above 240 mg/dL (13.3 mmol/L) for 2 days in a row.  You have been sick or have had a fever for 2 days or longer, and you are not getting better.  You have any of the following problems for more than 6 hours: ? You cannot eat or drink. ? You have nausea and vomiting. ? You have diarrhea. Get help right away if:  You have severe hypoglycemia. This means your blood glucose is lower than 54 mg/dL (3.0 mmol/L).  You become confused or you have trouble thinking clearly.  You have difficulty breathing.  You have moderate or large ketone levels in your urine. These symptoms may represent a serious problem that is  an emergency. Do not wait to see if the symptoms will go away. Get medical help right away. Call your local emergency services (911 in the [place] Do not drive yourself to the hospital. Summary  Type 2 diabetes (type 2 diabetes mellitus) is a long-term, or chronic, disease. In type 2 diabetes, the pancreas does not make enough of a hormone called insulin, or cells in the body do not respond properly to insulin that the body makes (insulin resistance).  This condition is treated by making dietary and lifestyle changes and taking diabetes medicines or insulin.  Your health care provider will set treatment goals for you. Your goals will be based on your age, other medical conditions you have, and how you respond to diabetes treatment.  Keep all follow-up visits as told by your health care provider. This is important. This information is not intended to replace advice given to you by your health care provider. Make sure you discuss any questions you have with your health care provider. Document Revised: 04/23/2020 Document Reviewed: 04/23/2020 Elsevier Patient Education  [place].

## 2020-11-25 NOTE — Progress Notes (Signed)
Patient ID: Ashley Mcconnell, female    DOB: 05/08/1961  MRN: [id number]  CC: Diabetes Follow-Up  Subjective: Ashley Mcconnell is a 60 y.o. female who presents for diabetes follow-up.  1. DIABETES TYPE 2 FOLLOW-UP: Last A1C:  6.5% on 10/24/2020 Med Adherence:  _0  Yes    _1  No Medication side effects:  _2  Yes    _3  No Home Monitoring?  _4  Yes    _5  No Home glucose results range: 11/18/2020 - 11/25/2020 (morning, evening) 134,  131, 130  131, 183  160, 129  131 141  106, 109  Diet Adherence: _6  Yes    _7  No Exercise: _8  Yes    _9  No Last eye exam: February 2021   Patient Active Problem List   Diagnosis Date Noted  . Chest pain of uncertain etiology 66/44/0347  . Diabetes mellitus due to underlying condition with hyperosmolarity without coma, without long-term current use of insulin ([place]) 10/31/2020  . Abdominal pain 10/29/2020  . Accumulation of fluid in tissues 10/29/2020  . Arthralgia of lower leg 10/29/2020  . Blood in feces 10/29/2020  . 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 Can't get food down 10/29/2020  . Fatigue 10/29/2020  . Hemorrhoids, internal 10/29/2020  . History of colon polyps 10/29/2020  . Infection of the upper respiratory tract 10/29/2020  . Interdigital neuralgia 10/29/2020  . Pulmonary embolism ([place])   . Morton's neuroma   . Migraine   . Hypertension   . Cardiomyopathy ([place])   . Type 2 diabetes mellitus ([place]) 10/25/2020  . Hyperlipemia 10/25/2020  . Blood in urine 07/14/2020  . Nephrolithiasis 10/10/2018  . Benign essential hypertension 06/01/2018  . Family history of colon cancer requiring screening colonoscopy 02/09/2018  . Family history of colonic polyps 02/09/2018  . Hyperglycemia 11/09/2017  . Acute infection of nasal sinus 12/10/2015  . Abdominal pain, right upper quadrant 12/10/2015  . Prediabetes 12/10/2015  . Chill 12/10/2015  . FOM (frequency of micturition) 12/10/2015  . Dyspnea 07/25/2015  . LPRD (laryngopharyngeal  reflux disease) 07/25/2015  . GERD (gastroesophageal reflux disease) 07/25/2015  . Obesity 07/25/2015  . Chest wall pain 07/25/2015  . 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 Calculus of kidney 02/06/2013  . Left flank pain 02/06/2013  . Leukocytosis 02/06/2013     Current Outpatient Medications on File Prior to Visit  Medication Sig Dispense Refill  . atorvastatin (LIPITOR) 20 MG tablet Take 1 tablet (20 mg total) by mouth daily. 90 tablet 0  . Blood Glucose Monitoring Suppl (TRUE METRIX METER) w/Device KIT Use as directed 1 kit 0  . glucose blood (TRUE METRIX BLOOD GLUCOSE TEST) test strip Use as instructed 100 each 12  . hydrochlorothiazide (HYDRODIURIL) 12.5 MG tablet Take 1 tablet (12.5 mg total) by mouth daily. 90 tablet 0  . omeprazole (PRILOSEC) 20 MG capsule Take 1 capsule (20 mg total) by mouth daily. 90 capsule 0  . ondansetron (ZOFRAN ODT) 4 MG disintegrating tablet Take 1 tablet (4 mg total) by mouth every 8 (eight) hours as needed for nausea or vomiting. 20 tablet 0  . triamcinolone ointment (KENALOG) 0.1 % Apply 1 application topically 2 (two) times daily as needed (itching).     . TRUEplus Lancets 28G MISC Use as directed 100 each 4  . oxyCODONE-acetaminophen (PERCOCET/ROXICET) 5-325 MG tablet Take 1-2 tablets by mouth every 6 (six) hours as needed for severe pain. (Patient not taking: Reported on 11/25/2020) 10 tablet 0   No current facility-administered medications on file prior to visit.  Allergies  Allergen Reactions  . Contrast Media [Iodinated Diagnostic Agents]     Rash   . Fish Allergy Rash    [name] is only fish she does not have allergy to  . Shellfish Allergy Itching and Rash    Social History   Socioeconomic History  . Marital status: Single    Spouse name: Not on file  . Number of children: 3  . Years of education: College  . Highest education level: Not on file  Occupational History    Employer: OTHER    Comment: [employer].   Tobacco Use   . Smoking status: Former Smoker    Packs/day: 0.20    Years: 2.00    Pack years: 0.40    Types: Cigarettes    Quit date: 07/24/1986    Years since quitting: 34.3  . Smokeless tobacco: Never Used  . Tobacco comment: social smoker  Substance and Sexual Activity  . Alcohol use: Yes    Alcohol/week: 0.0 standard drinks    Comment: 1 drink on New Year's Eve  . Drug use: No  . Sexual activity: Not Currently  Other Topics Concern  . Not on file  Social History Narrative   Patient lives at home with her family.   Caffeine Use: 1 cup of tea two times weekly   Social Determinants of Health   Financial Resource Strain: Not on file  Food Insecurity: Not on file  Transportation Needs: Not on file  Physical Activity: Not on file  Stress: Not on file  Social Connections: Not on file  Intimate Partner Violence: Not on file    Family History  Problem Relation Age of Onset  . Breast cancer Mother   . Stroke Father   . Heart Problems Father        heart bypass  . Alzheimer's disease Paternal Aunt   . Alzheimer's disease Paternal Uncle   . Heart Problems Paternal Grandmother        enlarged heart  . Alzheimer's disease Paternal Aunt   . Alzheimer's disease Paternal Aunt     Past Surgical History:  Procedure Laterality Date  . ABDOMINAL HYSTERECTOMY    . CARDIAC SURGERY  04/2014   Catherization   . CHOLECYSTECTOMY    . CYSTOSCOPY/URETEROSCOPY/HOLMIUM LASER/STENT PLACEMENT Left 10/10/2018   Procedure: CYSTOSCOPY/URETEROSCOPY/HOLMIUM LASER/STENT PLACEMENT;  Surgeon: Ceasar Mons, MD;  Location: [facility];  Service: Urology;  Laterality: Left;  . LITHOTRIPSY     x5  . TONSILLECTOMY  1969    ROS: Review of Systems Negative except as stated above  PHYSICAL EXAM: BP 133/86 (BP Location: Left Arm, Patient Position: Sitting)   Pulse 99   Wt 231 lb 12.8 oz (105.1 kg)   SpO2 96%   BMI 38.57 kg/m   Physical Exam Constitutional:      Appearance: She is obese.   HENT:     Head: Normocephalic and atraumatic.  Eyes:     Extraocular Movements: Extraocular movements intact.     Pupils: Pupils are equal, round, and reactive to light.  Cardiovascular:     Rate and Rhythm: Normal rate and regular rhythm.     Pulses: Normal pulses.     Heart sounds: Normal heart sounds.  Pulmonary:     Effort: Pulmonary effort is normal.     Breath sounds: Normal breath sounds.  Musculoskeletal:     Cervical back: Normal range of motion and neck supple.  Neurological:     General: No focal deficit present.  Mental Status: She is alert and oriented to person, place, and time.  Psychiatric:        Mood and Affect: Mood normal.        Behavior: Behavior normal.    ASSESSMENT AND PLAN: 1. Type 2 diabetes mellitus without complication, without long-term current use of insulin ([place]): - Doing well taking Metformin.  - Home blood sugar log close to goal.  - Continue Metformin as prescribed.  - Last hemoglobin A1c obtained 10/24/2020. Next due April 2022.  - Discussed the importance of healthy eating habits, low-carbohydrate diet, low-sugar diet, regular aerobic exercise (at least 150 minutes a week as tolerated) and medication compliance to achieve or maintain control of diabetes. - Follow-up with primary provider in 2 months or sooner if needed.  - metFORMIN (GLUCOPHAGE) 500 MG tablet; Take 1 tablet (500 mg total) by mouth daily with breakfast.  Dispense: 90 tablet; Refill: 0  2. Encounter for screening mammogram for malignant neoplasm of breast: - Appointment scheduled during today's visit by [place] for 01/14/2021.  3. Cervical cancer screening: - Per patient request referral to Obstetrics / Gynecology for cervical cancer screening by PAP smear.  - Ambulatory referral to Gynecology  4. Bladder spasms: 5. Overactive bladder: - Continue Oxybutynin as prescribed.  - Follow-up with primary provider as needed.  - oxybutynin (DITROPAN-XL) 10 MG 24 hr tablet; Take 1  tablet (10 mg total) by mouth daily.  Dispense: 120 tablet; Refill: 0  Patient was given the opportunity to ask questions.  Patient verbalized understanding of the plan and was able to repeat key elements of the plan. Patient was given clear instructions to go to Emergency Department or return to medical center if symptoms don't improve, worsen, or new problems develop.The patient verbalized understanding.   Orders Placed This Encounter  Procedures  . Ambulatory referral to Gynecology     Requested Prescriptions   Signed Prescriptions Disp Refills  . oxybutynin (DITROPAN-XL) 10 MG 24 hr tablet 120 tablet 0    Sig: Take 1 tablet (10 mg total) by mouth daily.  . metFORMIN (GLUCOPHAGE) 500 MG tablet 90 tablet 0    Sig: Take 1 tablet (500 mg total) by mouth daily with breakfast.    Return in about 8 weeks (around 01/20/2021) for Follow-Up.  Camillia Herter, NP

## 2020-11-25 NOTE — Progress Notes (Signed)
Follow-up Mild stiffness in shoulders from cardic CT scan and ECHO Refill on oxybutynin

## 2020-11-26 DIAGNOSIS — R079 Chest pain, unspecified: Secondary | ICD-10-CM | POA: Diagnosis not present

## 2020-11-26 MED ORDER — METFORMIN HCL 500 MG PO TABS: 500.0000 mg | ORAL_TABLET | Freq: Every day | ORAL | 0 refills | Status: DC

## 2021-01-01 ENCOUNTER — Other Ambulatory Visit (HOSPITAL_COMMUNITY)
Admission: RE | Admit: 2021-01-01 | Discharge: 2021-01-01 | Disposition: A | Discharge status: Home / Self Care | Payer: Managed Care, Other (non HMO) | Source: Ambulatory Visit | Attending: Obstetrics and Gynecology | Admitting: Obstetrics and Gynecology

## 2021-01-01 ENCOUNTER — Ambulatory Visit (INDEPENDENT_AMBULATORY_CARE_PROVIDER_SITE_OTHER): Payer: Managed Care, Other (non HMO) | Admitting: Obstetrics and Gynecology

## 2021-01-01 ENCOUNTER — Encounter: Payer: Self-pay | Admitting: Obstetrics and Gynecology

## 2021-01-01 ENCOUNTER — Other Ambulatory Visit: Payer: Self-pay

## 2021-01-01 VITALS — BP 127/84 | HR 86 | Ht 65.0 in | Wt 234.8 lb

## 2021-01-01 DIAGNOSIS — Z0000 Encounter for general adult medical examination without abnormal findings: Secondary | ICD-10-CM | POA: Insufficient documentation

## 2021-01-01 DIAGNOSIS — Z01419 Encounter for gynecological examination (general) (routine) without abnormal findings: Secondary | ICD-10-CM | POA: Diagnosis not present

## 2021-01-01 NOTE — Progress Notes (Addendum)
Ashley Mcconnell is a 60 y.o. G3P3 with PMHx of total hysterectomy + BSO here for a routine annual gynecologic exam. No current complaints. Denies abnormal vaginal bleeding, discharge, pelvic pain, or other gynecologic concerns. She is not sexually active and denies screening for STIs.   Gynecologic History Hysterectomy: total transabdominal + BSO in 1996 for dysmenorrhea and menorrhagia secondary to fibroids and PCOS. No s/p HRT. Hx vaginal atrophy: used Premarin cream for a week before discontinuation. No current complaints about dryness, friability, or bleeding. Contraception: not sexually active, status post hysterectomy Last Pap: 2-50yr, nl by pt report (not in EHR) Last mammogram: 10/19/2019, nl. FH of BC (mother). Last colonoscopy: [facility] and pt believes recent nl colonoscopy but records not viewable in EHR. FH of CRC (sister). Being followed by PCP.  Reviewed records: 10/19/2019 MM 3D SCREEN BREAST BILAT: No mammographic evidence of malignancy. 10/24/2020 Progress Note (PCP): Plan for routine screenings- pap, mammogram, colonoscopy per HPI  Obstetric History OB History  Gravida Para Term Preterm AB Living  3         3  SAB IAB Ectopic Multiple Live Births          3    # Outcome Date GA Lbr Len/2nd Weight Sex Delivery Anes PTL Lv  3 Grav[place]           2 Grav[place]           1 [place]            Past Medical History:  Diagnosis Date  . [place]   . Hypertension    Phreesia 09/26/2020  . Kidney stones   . Migraine   . Morton's neuroma    [facility] [place]  . Pulmonary embolism ([facility]     Past Surgical History:  Procedure Laterality Date  . ABDOMINAL HYSTERECTOMY    . CARDIAC SURGERY  04/2014   Catherization   . CHOLECYSTECTOMY    . CYSTOSCOPY/URETEROSCOPY/HOLMIUM LASER/STENT PLACEMENT Left 10/10/2018   Procedure: CYSTOSCOPY/URETEROSCOPY/HOLMIUM LASER/STENT PLACEMENT;  Surgeon: WCeasar Mons MD;  Location: [facility];   Service: Urology;  Laterality: Left;  . LITHOTRIPSY     x5  . TONSILLECTOMY  1969    Current Outpatient Medications on File Prior to Visit  Medication Sig Dispense Refill  . atorvastatin (LIPITOR) 20 MG tablet Take 1 tablet (20 mg total) by mouth daily. 90 tablet 0  . Blood Glucose Monitoring Suppl (TRUE METRIX METER) w/Device KIT Use as directed 1 kit 0  . glucose blood (TRUE METRIX BLOOD GLUCOSE TEST) test strip Use as instructed 100 each 12  . hydrochlorothiazide (HYDRODIURIL) 12.5 MG tablet Take 1 tablet (12.5 mg total) by mouth daily. 90 tablet 0  . metFORMIN (GLUCOPHAGE) 500 MG tablet Take 1 tablet (500 mg total) by mouth daily with breakfast. 90 tablet 0  . omeprazole (PRILOSEC) 20 MG capsule Take 1 capsule (20 mg total) by mouth daily. 90 capsule 0  . ondansetron (ZOFRAN ODT) 4 MG disintegrating tablet Take 1 tablet (4 mg total) by mouth every 8 (eight) hours as needed for nausea or vomiting. 20 tablet 0  . oxybutynin (DITROPAN-XL) 10 MG 24 hr tablet Take 1 tablet (10 mg total) by mouth daily. 120 tablet 0  . triamcinolone ointment (KENALOG) 0.1 % Apply 1 application topically 2 (two) times daily as needed (itching).     . TRUEplus Lancets 28G MISC Use as directed 100 each 4  . oxyCODONE-acetaminophen (PERCOCET/ROXICET) 5-325 MG tablet  Take 1-2 tablets by mouth every 6 (six) hours as needed for severe pain. (Patient not taking: Reported on 11/25/2020) 10 tablet 0   No current facility-administered medications on file prior to visit.    Allergies  Allergen Reactions  . Contrast Media [Iodinated Diagnostic Agents]     Rash   . Fish Allergy Rash    Ashley Mcconnell is only fish she does not have allergy to  . Shellfish Allergy Itching and Rash    Social History   Socioeconomic History  . Marital status: Single    Spouse name: Not on file  . Number of children: 3  . Years of education: College  . Highest education level: Not on file  Occupational History    Employer: OTHER     Comment: [employer].   Tobacco Use  . Smoking status: Former Smoker    Packs/day: 0.20    Years: 2.00    Pack years: 0.40    Types: Cigarettes    Quit date: 07/24/1986    Years since quitting: 34.4  . Smokeless tobacco: Never Used  . Tobacco comment: social smoker  Substance and Sexual Activity  . Alcohol use: Yes    Alcohol/week: 0.0 standard drinks    Comment: 1 drink on New Year's Eve  . Drug use: No  . Sexual activity: Not Currently    Birth control/protection: Surgical  Other Topics Concern  . Not on file  Social History Narrative   Patient lives at home with her family.   Caffeine Use: 1 cup of tea two times weekly   Social Determinants of Health   Financial Resource Strain: Not on file  Food Insecurity: Not on file  Transportation Needs: Not on file  Physical Activity: Not on file  Stress: Not on file  Social Connections: Not on file  Intimate Partner Violence: Not on file    Family History  Problem Relation Age of Onset  . Breast cancer Mother   . Stroke Father   . Heart Problems Father        heart bypass  . Alzheimer's disease Paternal Aunt   . Alzheimer's disease Paternal Uncle   . Heart Problems Paternal Grandmother        enlarged heart  . Alzheimer's disease Paternal Aunt   . Alzheimer's disease Paternal Aunt   . Cancer Sister        colorectal    The following portions of the patient's history were reviewed and updated as appropriate: allergies, current medications, past family history, past medical history, past social history, past surgical history and problem list.  Review of Systems A comprehensive review of systems was negative except for: Gastrointestinal: positive for hematochezia (hemorrhoids), Genitourinary: urinary frequency (followed by urology).   Objective:  BP 127/84   Pulse 86   Ht '5\' 5"'  (1.651 m)   Wt 234 lb 12.8 oz (106.5 kg)   BMI 39.07 kg/m  Physical Exam Exam conducted with a chaperone present.   Constitutional:      Appearance: Normal appearance.  HENT:     Head: Normocephalic and atraumatic.  Pulmonary:     Effort: Pulmonary effort is normal.  Chest:     Comments: Breasts: Symmetric in size. No masses, skin changes, nipple drainage, or lymphadenopathy. Genitourinary:    Exam position: Supine.     Comments: Pelvic: Normal appearing external genitalia; normal appearing vaginal mucosa and vaginal cuff. No abnormal discharge noted. Vaginal pap smear obtained. Uterus and cervix surgerical absent. No palpable masses  or tenderness on bimanual exam. Skin:    General: Skin is warm and dry.  Neurological:     General: No focal deficit present.     Mental Status: She is alert and oriented to person, place, and time.  Psychiatric:        Attention and Perception: Attention and perception normal.        Mood and Affect: Mood and affect normal.        Speech: Speech normal.        Behavior: Behavior normal. Behavior is cooperative.        Thought Content: Thought content normal.        Cognition and Memory: Cognition and memory normal.        Judgment: Judgment normal.     Assessment:  Ashley Mcconnell is a 60 y.o. G3P3 with PMHx of total hysterectomy + BSO here for a routine annual gynecologic exam and pap smear.   Plan:  #Pap: Will follow up with path results. Pt counseled that if negative, regular paps are not indicated in absence of new sexual partners d/t total hysterectomy. #Mammogram: scheduled for 01/14/21. #Colonoscopy: Per PCP note pt will bring records to next appointment. Given FH of CRC, USPSTF recommendation is colonoscopy every 60yr in absence of positive genetic testing.  Pt counseled to f/u for annual gynecologic visit as desired, or with new presentation of sx.  CGenelle Gather MS3  Attestation of Attending Supervision of Medical Student: Evaluation and management procedures were performed by the Medical Student under my supervision.  I have seen and examined the  patient, reviewed the the student's note and chart, and I agree with management and plan.  MArlina Robes MD, FBoydAttending ONorth [place] [facility]

## 2021-01-02 LAB — HM DIABETES EYE EXAM

## 2021-01-03 NOTE — Progress Notes (Signed)
Patient ID: Ashley Mcconnell, female   DOB: September 30, 1961, 60 y.o.   MRN: [id number] Please see medical students documentation.

## 2021-01-05 LAB — CYTOLOGY - PAP
Adequacy: ABSENT
Comment: NEGATIVE
Diagnosis: NEGATIVE
High risk HPV: NEGATIVE

## 2021-01-14 ENCOUNTER — Other Ambulatory Visit: Payer: Self-pay | Admitting: Family

## 2021-01-14 ENCOUNTER — Ambulatory Visit
Admission: RE | Admit: 2021-01-14 | Discharge: 2021-01-14 | Disposition: A | Discharge status: Home / Self Care | Payer: No Typology Code available for payment source | Source: Ambulatory Visit | Attending: Family | Admitting: Family

## 2021-01-14 ENCOUNTER — Other Ambulatory Visit: Payer: Self-pay

## 2021-01-14 DIAGNOSIS — Z1231 Encounter for screening mammogram for malignant neoplasm of breast: Secondary | ICD-10-CM

## 2021-01-16 ENCOUNTER — Other Ambulatory Visit: Payer: Self-pay | Admitting: Family

## 2021-01-16 DIAGNOSIS — K219 Gastro-esophageal reflux disease without esophagitis: Secondary | ICD-10-CM

## 2021-01-16 DIAGNOSIS — E785 Hyperlipidemia, unspecified: Secondary | ICD-10-CM

## 2021-01-16 NOTE — Progress Notes (Signed)
No malignancy. Repeat in 1 year.

## 2021-01-19 ENCOUNTER — Other Ambulatory Visit: Payer: Self-pay

## 2021-01-19 ENCOUNTER — Telehealth: Payer: Self-pay | Admitting: Family

## 2021-01-19 DIAGNOSIS — I10 Essential (primary) hypertension: Secondary | ICD-10-CM

## 2021-01-19 MED ORDER — HYDROCHLOROTHIAZIDE 12.5 MG PO TABS: 12.5000 mg | ORAL_TABLET | Freq: Every day | ORAL | 0 refills | Status: DC

## 2021-01-19 NOTE — Progress Notes (Signed)
Hydrochlorothiazide refilled

## 2021-01-19 NOTE — Telephone Encounter (Signed)
1) Medication(s) Requested (by name): hydrochlorothiazide (HYDRODIURIL) 12.5 MG tablet    2) Pharmacy of Choice: [id number] - [place], [place] - [address]   3) Special Requests: Patient states she has been out of medication for 4 days.    Approved medications will be sent to the pharmacy, we will reach out if there is an issue.  Requests made after 3pm may not be addressed until the following business day!  If a patient is unsure of the name of the medication(s) please note and ask patient to call back when they are able to provide all info, do not send to responsible party until all information is available!

## 2021-01-23 ENCOUNTER — Ambulatory Visit: Payer: Managed Care, Other (non HMO) | Admitting: Family

## 2021-01-25 NOTE — Progress Notes (Signed)
Patient ID: Ashley Mcconnell, female    DOB: [birth year]  MRN: [id number]  CC: Diabetes Follow-Up  Subjective: Ashley Mcconnell is a 60 y.o. female who presents for diabetes follow-up. Her concerns today include:   1. DIABETES TYPE 2 FOLLOW-UP: 11/25/2020: - Doing well taking Metformin.  - Home blood sugar log close to goal.  - Continue Metformin as prescribed.  - Last hemoglobin A1c obtained 10/24/2020. Next due April 2022.   01/26/2021: Last A1C:    Med Adherence:  _0  Yes    _1  No Medication side effects:  _2  Yes    _3  No Home Monitoring?  _4  Yes    _5  No Home glucose results range: 130-150 fasting and non-fasting ranges, reports using testing supplies provided by her employer Diet Adherence: _6  Yes    _7  No  2. FATIGUE:  Reports ongoing for at least 3 years. Taking over-the-counter vitamin D supplements. Did take vitamin B12 injections in the past which seemed to help some. In the past attempted to take several different brands of multivitamin supplements which made her feel sick. Does have history of anemia with childbirth. Reports feeling tired regardless of how many hours of sleep she gets. Would like T3 and T4 blood work today.  Patient Active Problem List   Diagnosis Date Noted  . Chest pain of uncertain etiology 55/73/2202  . Diabetes mellitus due to underlying condition with hyperosmolarity without coma, without long-term current use of insulin ([place]) 10/31/2020  . Abdominal pain 10/29/2020  . Accumulation of fluid in tissues 10/29/2020  . Arthralgia of lower leg 10/29/2020  . Blood in feces 10/29/2020  . 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 Can't get food down 10/29/2020  . Fatigue 10/29/2020  . Hemorrhoids, internal 10/29/2020  . History of colon polyps 10/29/2020  . Infection of the upper respiratory tract 10/29/2020  . Interdigital neuralgia 10/29/2020  . Pulmonary embolism ([place])   . Morton's neuroma   . Migraine   . Hypertension   . Cardiomyopathy  ([place])   . Type 2 diabetes mellitus ([place]) 10/25/2020  . Hyperlipemia 10/25/2020  . Blood in urine 07/14/2020  . Nephrolithiasis 10/10/2018  . Benign essential hypertension 06/01/2018  . Family history of colon cancer requiring screening colonoscopy 02/09/2018  . Family history of colonic polyps 02/09/2018  . Hyperglycemia 11/09/2017  . Acute infection of nasal sinus 12/10/2015  . Abdominal pain, right upper quadrant 12/10/2015  . Prediabetes 12/10/2015  . Chill 12/10/2015  . FOM (frequency of micturition) 12/10/2015  . Dyspnea 07/25/2015  . 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  . GERD (gastroesophageal reflux disease) 07/25/2015  . Obesity 07/25/2015  . Chest wall pain 07/25/2015  . 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 Calculus of kidney 02/06/2013  . Left flank pain 02/06/2013  . Leukocytosis 02/06/2013     Current Outpatient Medications on File Prior to Visit  Medication Sig Dispense Refill  . atorvastatin (LIPITOR) 20 MG tablet TAKE 1 TABLET BY MOUTH EVERY DAY 90 tablet 0  . Blood Glucose Monitoring Suppl (TRUE METRIX METER) w/Device KIT Use as directed 1 kit 0  . glucose blood (TRUE METRIX BLOOD GLUCOSE TEST) test strip Use as instructed 100 each 12  . hydrochlorothiazide (HYDRODIURIL) 12.5 MG tablet Take 1 tablet (12.5 mg total) by mouth daily. 90 tablet 0  . omeprazole (PRILOSEC) 20 MG capsule TAKE 1 CAPSULE BY MOUTH EVERY DAY 90 capsule 0  . ondansetron (ZOFRAN ODT) 4 MG disintegrating tablet Take 1 tablet (4 mg total) by mouth every 8 (eight)  hours as needed for nausea or vomiting. 20 tablet 0  . oxybutynin (DITROPAN-XL) 10 MG 24 hr tablet Take 1 tablet (10 mg total) by mouth daily. 120 tablet 0  . triamcinolone ointment (KENALOG) 0.1 % Apply 1 application topically 2 (two) times daily as needed (itching).     . TRUEplus Lancets 28G MISC Use as directed 100 each 4   No current facility-administered medications on file prior to visit.    Allergies  Allergen  Reactions  . Contrast Media [Iodinated Diagnostic Agents]     Rash   . Fish Allergy Rash    [name] is only fish she does not have allergy to  . Shellfish Allergy Itching and Rash    Social History   Socioeconomic History  . Marital status: Single    Spouse name: Not on file  . Number of children: 3  . Years of education: College  . Highest education level: Not on file  Occupational History    Employer: OTHER    Comment: [employer].   Tobacco Use  . Smoking status: Former Smoker    Packs/day: 0.20    Years: 2.00    Pack years: 0.40    Types: Cigarettes    Quit date: 07/24/1986    Years since quitting: 34.5  . Smokeless tobacco: Never Used  . Tobacco comment: social smoker  Substance and Sexual Activity  . Alcohol use: Yes    Alcohol/week: 0.0 standard drinks    Comment: 1 drink on New Year's Eve  . Drug use: No  . Sexual activity: Not Currently    Birth control/protection: Surgical  Other Topics Concern  . Not on file  Social History Narrative   Patient lives at home with her family.   Caffeine Use: 1 cup of tea two times weekly   Social Determinants of Health   Financial Resource Strain: Not on file  Food Insecurity: Not on file  Transportation Needs: Not on file  Physical Activity: Not on file  Stress: Not on file  Social Connections: Not on file  Intimate Partner Violence: Not on file    Family History  Problem Relation Age of Onset  . Breast cancer Mother   . Stroke Father   . Heart Problems Father        heart bypass  . Alzheimer's disease Paternal Aunt   . Alzheimer's disease Paternal Uncle   . Heart Problems Paternal Grandmother        enlarged heart  . Alzheimer's disease Paternal Aunt   . Alzheimer's disease Paternal Aunt   . Cancer Sister        colorectal    Past Surgical History:  Procedure Laterality Date  . ABDOMINAL HYSTERECTOMY    . CARDIAC SURGERY  04/2014   Catherization   . CHOLECYSTECTOMY    .  CYSTOSCOPY/URETEROSCOPY/HOLMIUM LASER/STENT PLACEMENT Left 10/10/2018   Procedure: CYSTOSCOPY/URETEROSCOPY/HOLMIUM LASER/STENT PLACEMENT;  Surgeon: Ceasar Mons, MD;  Location: [facility];  Service: Urology;  Laterality: Left;  . LITHOTRIPSY     x5  . TONSILLECTOMY  1969    ROS: Review of Systems Negative except as stated above  PHYSICAL EXAM: BP 124/85 (BP Location: Left Arm, Patient Position: Sitting)   Pulse 87   Ht _0  (1.651 m)   Wt 231 lb 6.4 oz (105 kg)   SpO2 97%   BMI 38.51 kg/m   Physical Exam HENT:     Head: Normocephalic and atraumatic.  Eyes:     Extraocular Movements:  Extraocular movements intact.     Conjunctiva/sclera: Conjunctivae normal.     Pupils: Pupils are equal, round, and reactive to light.  Cardiovascular:     Rate and Rhythm: Normal rate and regular rhythm.     Pulses: Normal pulses.     Heart sounds: Normal heart sounds.  Pulmonary:     Effort: Pulmonary effort is normal.     Breath sounds: Normal breath sounds.  Musculoskeletal:     Cervical back: Normal range of motion and neck supple.  Neurological:     General: No focal deficit present.     Mental Status: She is alert and oriented to person, place, and time.  Psychiatric:        Mood and Affect: Mood normal.        Behavior: Behavior normal.     Results for orders placed or performed in visit on 01/26/21  POCT glycosylated hemoglobin (Hb A1C)  Result Value Ref Range   Hemoglobin A1C 6.4 (A) 4.0 - 5.6 %   HbA1c POC (<> result, manual entry)     HbA1c, POC (prediabetic range)     HbA1c, POC (controlled diabetic range)      ASSESSMENT AND PLAN: 1. Type 2 diabetes mellitus without complication, without long-term current use of insulin ([place]): - Hemoglobin A1c today at goal at 6.4%, goal < 7%. This is similar to previous hemoglobin A1c of 6.5% on 10/24/2020. Next hemoglobin A1c due July 2022.  - Continue Metformin as prescribed.  - To achieve an A1C goal of less than or  equal to 7.0 percent, a fasting blood sugar of 80 to 130 mg/dL and a postprandial glucose (90 to 120 minutes after a meal) less than 180 mg/dL. In the event of sugars less than 60 mg/dl or greater than 400 mg/dl please notify the clinic ASAP. It is recommended that you undergo annual eye exams and annual foot exams. - Discussed the importance of healthy eating habits, low-carbohydrate diet, low-sugar diet, regular aerobic exercise (at least 150 minutes a week as tolerated) and medication compliance to achieve or maintain control of diabetes. - Follow-up with primary provider in 3 months or sooner if needed.  - POCT glycosylated hemoglobin (Hb A1C) - metFORMIN (GLUCOPHAGE) 500 MG tablet; Take 1 tablet (500 mg total) by mouth daily with breakfast.  Dispense: 90 tablet; Refill: 0  2. Fatigue, unspecified type: - Ongoing for 3 years or more.  - Continue over-the-counter vitamin D. - Patient intolerant to multivitamins.  - CMP last obtained 10/24/2020. - CBC last obtained 10/24/2020. - TSH last obtained 10/24/2020. - T3 and T4 to further evaluate thyroid function. - Follow-up with primary provider as scheduled. - T3, Free - T4, Free   Patient was given the opportunity to ask questions.  Patient verbalized understanding of the plan and was able to repeat key elements of the plan. Patient was given clear instructions to go to Emergency Department or return to medical center if symptoms don't improve, worsen, or new problems develop.The patient verbalized understanding.   Orders Placed This Encounter  Procedures  . T3, Free  . T4, Free  . POCT glycosylated hemoglobin (Hb A1C)     Requested Prescriptions   Signed Prescriptions Disp Refills  . metFORMIN (GLUCOPHAGE) 500 MG tablet 90 tablet 0    Sig: Take 1 tablet (500 mg total) by mouth daily with breakfast.    Return in about 3 months (around 04/27/2021) for Follow-Up diabetes.  Camillia Herter, NP

## 2021-01-26 ENCOUNTER — Other Ambulatory Visit: Payer: Self-pay

## 2021-01-26 ENCOUNTER — Ambulatory Visit (INDEPENDENT_AMBULATORY_CARE_PROVIDER_SITE_OTHER): Payer: Managed Care, Other (non HMO) | Admitting: Family

## 2021-01-26 ENCOUNTER — Encounter: Payer: Self-pay | Admitting: Family

## 2021-01-26 VITALS — BP 124/85 | HR 87 | Ht 65.0 in | Wt 231.4 lb

## 2021-01-26 DIAGNOSIS — E119 Type 2 diabetes mellitus without complications: Secondary | ICD-10-CM

## 2021-01-26 DIAGNOSIS — R5383 Other fatigue: Secondary | ICD-10-CM | POA: Diagnosis not present

## 2021-01-26 LAB — POCT GLYCOSYLATED HEMOGLOBIN (HGB A1C): Hemoglobin A1C: 6.4 % — AB (ref 4.0–5.6)

## 2021-01-26 MED ORDER — METFORMIN HCL 500 MG PO TABS
500.0000 mg | ORAL_TABLET | Freq: Every day | ORAL | 0 refills | Status: DC
Start: 2021-01-26 — End: 2021-03-03

## 2021-01-26 NOTE — Progress Notes (Signed)
Diabetes f/u

## 2021-01-26 NOTE — Patient Instructions (Addendum)
Continue Metformin. Follow-up in 3 months or sooner if needed.  Metformin Tablets What is this medicine? METFORMIN (met FOR min) is used to treat type 2 diabetes. It helps to control blood sugar. Treatment is combined with diet and exercise. This medicine can be used alone or with other medicines for diabetes. This medicine may be used for other purposes; ask your health care provider or pharmacist if you have questions. COMMON BRAND NAME(S): Glucophage What should I tell my health care provider before I take this medicine? They need to know if you have any of these conditions:  anemia  dehydration  heart disease  frequently drink alcohol-containing beverages  kidney disease  liver disease  polycystic ovary syndrome  serious infection or injury  vomiting  an unusual or allergic reaction to metformin, other medicines, foods, dyes, or preservatives  pregnant or trying to get pregnant  breast-feeding How should I use this medicine? Take this medicine by mouth with a glass of water. Follow the directions on the prescription label. Take this medicine with food. Take your medicine at regular intervals. Do not take your medicine more often than directed. Do not stop taking except on your doctor's advice. Talk to your pediatrician regarding the use of this medicine in children. While this drug may be prescribed for children as young as 4 years of age for selected conditions, precautions do apply. Overdosage: If you think you have taken too much of this medicine contact a poison control center or emergency room at once. NOTE: This medicine is only for you. Do not share this medicine with others. What if I miss a dose? If you miss a dose, take it as soon as you can. If it is almost time for your next dose, take only that dose. Do not take double or extra doses. What may interact with this medicine? Do not take this medicine with any of the following medications:  certain contrast  medicines given before X-rays, CT scans, MRI, or other procedures  dofetilide This medicine may also interact with the following medications:  acetazolamide  alcohol  certain antivirals for HIV or hepatitis  certain medicines for blood pressure, heart disease, irregular heart beat  cimetidine  dichlorphenamide  digoxin  diuretics  female hormones, like estrogens or progestins and birth control pills  glycopyrrolate  isoniazid  lamotrigine  memantine  methazolamide  metoclopramide  midodrine  niacin  phenothiazines like chlorpromazine, mesoridazine, prochlorperazine, thioridazine  phenytoin  ranolazine  steroid medicines like prednisone or cortisone  stimulant medicines for attention disorders, weight loss, or to stay awake  thyroid medicines  topiramate  trospium  vandetanib  zonisamide This list may not describe all possible interactions. Give your health care provider a list of all the medicines, herbs, non-prescription drugs, or dietary supplements you use. Also tell them if you smoke, drink alcohol, or use illegal drugs. Some items may interact with your medicine. What should I watch for while using this medicine? Visit your doctor or health care professional for regular checks on your progress. A test called the HbA1C (A1C) will be monitored. This is a simple blood test. It measures your blood sugar control over the last 2 to 3 months. You will receive this test every 3 to 6 months. Learn how to check your blood sugar. Learn the symptoms of low and high blood sugar and how to manage them. Always carry a quick-source of sugar with you in case you have symptoms of low blood sugar. Examples include hard sugar  candy or glucose tablets. Make sure others know that you can choke if you eat or drink when you develop serious symptoms of low blood sugar, such as seizures or unconsciousness. They must get medical help at once. Tell your doctor or health care  professional if you have high blood sugar. You might need to change the dose of your medicine. If you are sick or exercising more than usual, you might need to change the dose of your medicine. Do not skip meals. Ask your doctor or health care professional if you should avoid alcohol. Many nonprescription cough and cold products contain sugar or alcohol. These can affect blood sugar. This medicine may cause ovulation in premenopausal women who do not have regular monthly periods. This may increase your chances of becoming pregnant. You should not take this medicine if you become pregnant or think you may be pregnant. Talk with your doctor or health care professional about your birth control options while taking this medicine. Contact your doctor or health care professional right away if you think you are pregnant. If you are going to need surgery, a MRI, CT scan, or other procedure, tell your doctor that you are taking this medicine. You may need to stop taking this medicine before the procedure. Wear a medical ID bracelet or chain, and carry a card that describes your disease and details of your medicine and dosage times. This medicine may cause a decrease in folic acid and vitamin B12. You should make sure that you get enough vitamins while you are taking this medicine. Discuss the foods you eat and the vitamins you take with your health care professional. What side effects may I notice from receiving this medicine? Side effects that you should report to your doctor or health care professional as soon as possible:  allergic reactions like skin rash, itching or hives, swelling of the face, lips, or tongue  breathing problems  feeling faint or lightheaded, falls  muscle aches or pains  signs and symptoms of low blood sugar such as feeling anxious, confusion, dizziness, increased hunger, unusually weak or tired, sweating, shakiness, cold, irritable, headache, blurred vision, fast heartbeat, loss of  consciousness  slow or irregular heartbeat  unusual stomach pain or discomfort  unusually tired or weak Side effects that usually do not require medical attention (report to your doctor or health care professional if they continue or are bothersome):  diarrhea  headache  heartburn  metallic taste in mouth  nausea  stomach gas, upset This list may not describe all possible side effects. Call your doctor for medical advice about side effects. You may report side effects to FDA at 1-800-FDA-1088. Where should I keep my medicine? Keep out of the reach of children. Store at room temperature between 15 and 30 degrees C (59 and 86 degrees F). Protect from moisture and light. Throw away any unused medicine after the expiration date. NOTE: This sheet is a summary. It may not cover all possible information. If you have questions about this medicine, talk to your doctor, pharmacist, or health care provider.  2021 [employer]vier/Gold Standard (2020-08-24 10:29:07)

## 2021-01-27 ENCOUNTER — Encounter: Payer: Self-pay | Admitting: Cardiology

## 2021-01-27 ENCOUNTER — Ambulatory Visit (INDEPENDENT_AMBULATORY_CARE_PROVIDER_SITE_OTHER): Payer: Managed Care, Other (non HMO) | Admitting: Cardiology

## 2021-01-27 VITALS — BP 130/88 | HR 88 | Ht 65.0 in | Wt 237.1 lb

## 2021-01-27 DIAGNOSIS — E782 Mixed hyperlipidemia: Secondary | ICD-10-CM

## 2021-01-27 DIAGNOSIS — E0800 Diabetes mellitus due to underlying condition with hyperosmolarity without nonketotic hyperglycemic-hyperosmolar coma (NKHHC): Secondary | ICD-10-CM

## 2021-01-27 DIAGNOSIS — I10 Essential (primary) hypertension: Secondary | ICD-10-CM

## 2021-01-27 DIAGNOSIS — E669 Obesity, unspecified: Secondary | ICD-10-CM | POA: Diagnosis not present

## 2021-01-27 LAB — T4, FREE: Free T4: 1.26 ng/dL (ref 0.82–1.77)

## 2021-01-27 LAB — T3, FREE: T3, Free: 3.3 pg/mL (ref 2.0–4.4)

## 2021-01-27 NOTE — Progress Notes (Signed)
Cardiology Office Note:    Date:  01/27/2021   ID:  Ashley Mcconnell, DOB March 27, 1961, MRN [id number]  PCP:  Camillia Herter, NP  Cardiologist:  Berniece Salines, DO  Electrophysiologist:  None   Referring MD: Camillia Herter, NP   I am doing  History of Present Illness:    Ashley Mcconnell is a 60 y.o. female with a hx of hypertension, diabetes mellitus, hyperlipidemia, obesity here today for follow-up visit.  Did see the patient in January 2021 at that time she was experiencing chest pain and shortness of breath on exertion.  Given her risk factors I recommended the patient undergo coronary CTA as well as an echocardiogram.  She has been admitted get her testing done.  She is here today to discuss her results.  Past Medical History:  Diagnosis Date  . Cardiomyopathy ([place])   . Hypertension    Phreesia 09/26/2020  . Kidney stones   . Migraine   . Morton's neuroma    L Foot  . Pulmonary embolism [facility])     Past Surgical History:  Procedure Laterality Date  . ABDOMINAL HYSTERECTOMY    . CARDIAC SURGERY  04/2014   Catherization   . CHOLECYSTECTOMY    . CYSTOSCOPY/URETEROSCOPY/HOLMIUM LASER/STENT PLACEMENT Left 10/10/2018   Procedure: CYSTOSCOPY/URETEROSCOPY/HOLMIUM LASER/STENT PLACEMENT;  Surgeon: Ceasar Mons, MD;  Location: [facility];  Service: Urology;  Laterality: Left;  . LITHOTRIPSY     x5  . TONSILLECTOMY  1969    Current Medications: Current Meds  Medication Sig  . atorvastatin (LIPITOR) 20 MG tablet TAKE 1 TABLET BY MOUTH EVERY DAY  . Blood Glucose Monitoring Suppl (TRUE METRIX METER) w/Device KIT Use as directed  . glucose blood (TRUE METRIX BLOOD GLUCOSE TEST) test strip Use as instructed  . hydrochlorothiazide (HYDRODIURIL) 12.5 MG tablet Take 1 tablet (12.5 mg total) by mouth daily.  . metFORMIN (GLUCOPHAGE) 500 MG tablet Take 1 tablet (500 mg total) by mouth daily with breakfast.  . omeprazole (PRILOSEC) 20 MG capsule TAKE 1 CAPSULE BY MOUTH EVERY DAY  .  ondansetron (ZOFRAN ODT) 4 MG disintegrating tablet Take 1 tablet (4 mg total) by mouth every 8 (eight) hours as needed for nausea or vomiting.  Marland Kitchen oxybutynin (DITROPAN-XL) 10 MG 24 hr tablet Take 1 tablet (10 mg total) by mouth daily.  Marland Kitchen triamcinolone ointment (KENALOG) 0.1 % Apply 1 application topically 2 (two) times daily as needed (itching).   . TRUEplus Lancets 28G MISC Use as directed     Allergies:   Contrast media [iodinated diagnostic agents], Fish allergy, and Shellfish allergy   Social History   Socioeconomic History  . Marital status: Single    Spouse name: Not on file  . Number of children: 3  . Years of education: College  . Highest education level: Not on file  Occupational History    Employer: OTHER    Comment: [employer].   Tobacco Use  . Smoking status: Former Smoker    Packs/day: 0.20    Years: 2.00    Pack years: 0.40    Types: Cigarettes    Quit date: 07/24/1986    Years since quitting: 34.5  . Smokeless tobacco: Never Used  . Tobacco comment: social smoker  Substance and Sexual Activity  . Alcohol use: Yes    Alcohol/week: 0.0 standard drinks    Comment: 1 drink on New Year's Eve  . Drug use: No  . Sexual activity: Not Currently    Birth control/protection: Surgical  Other Topics Concern  . Not on file  Social History Narrative   Patient lives at home with her family.   Caffeine Use: 1 cup of tea two times weekly   Social Determinants of Health   Financial Resource Strain: Not on file  Food Insecurity: Not on file  Transportation Needs: Not on file  Physical Activity: Not on file  Stress: Not on file  Social Connections: Not on file     Family History: The patient's family history includes Alzheimer's disease in her paternal aunt, paternal aunt, paternal aunt, and paternal uncle; Breast cancer in her mother; Cancer in her sister; Heart Problems in her father and paternal grandmother; Stroke in her father.  ROS:   Review of  Systems  Constitution: Negative for decreased appetite, fever and weight gain.  HENT: Negative for congestion, ear discharge, hoarse voice and sore throat.   Eyes: Negative for discharge, redness, vision loss in right eye and visual halos.  Cardiovascular: Negative for chest pain, dyspnea on exertion, leg swelling, orthopnea and palpitations.  Respiratory: Negative for cough, hemoptysis, shortness of breath and snoring.   Endocrine: Negative for heat intolerance and polyphagia.  Hematologic/Lymphatic: Negative for bleeding problem. Does not bruise/bleed easily.  Skin: Negative for flushing, nail changes, rash and suspicious lesions.  Musculoskeletal: Negative for arthritis, joint pain, muscle cramps, myalgias, neck pain and stiffness.  Gastrointestinal: Negative for abdominal pain, bowel incontinence, diarrhea and excessive appetite.  Genitourinary: Negative for decreased libido, genital sores and incomplete emptying.  Neurological: Negative for brief paralysis, focal weakness, headaches and loss of balance.  Psychiatric/Behavioral: Negative for altered mental status, depression and suicidal ideas.  Allergic/Immunologic: Negative for HIV exposure and persistent infections.    EKGs/Labs/Other Studies Reviewed:    The following studies were reviewed today:   EKG: None today  CCTA from 08/31/2019 Aorta: Normal size.  No calcifications.  No dissection.  Aortic Valve:  Trileaflet.  No calcifications.  Coronary Arteries:  Normal coronary origin.  Right dominance.  RCA is a large dominant artery that gives rise to PDA and PLVB. There is no plaque. There is a portion of the mid RCA with stair step artifact.  Left main is a large artery that gives rise to LAD and LCX arteries.  LAD is a large vessel that has no plaque.  LCX is a non-dominant artery that gives rise to one large OM1 branch. There is no plaque.  Other findings:  Normal pulmonary vein drainage into the left  atrium.  Normal left atrial appendage without a thrombus.  Normal size of the pulmonary artery.  IMPRESSION: 1. Coronary calcium score of 0. This was 0 percentile for age and sex matched control.  2. Normal coronary origin with right dominance.  3. No evidence of CAD.  Iretha Kirley, DO  Transthoracic echocardiogram 11/2020 Left ventricular ejection fraction, by estimation, is 55 to 60%. The  left ventricle has normal function. The left ventricle demonstrates  regional wall motion abnormalities with hypokinesis in the mid anterior,  anterolateral and apical lateral, apex and apical septum,normal wall motion in the remaining myocardial wall segments. Unable to determine left ventricular hypertrophy no parasternal  long window. Indeterminant diastolic function. Echo enchancing agent used due to poor visibility.  2. Right ventricular systolic function is normal. The right ventricular  size is normal.  3. The mitral valve is normal in structure. No evidence of mitral valve  regurgitation. No evidence of mitral stenosis.  4. The aortic valve is normal in structure. Aortic valve  regurgitation is  not visualized. No aortic stenosis is present.  5. The inferior vena cava is normal in size with greater than 50%  respiratory variability, suggesting right atrial pressure of 3 mmHg.  6. Unable to determine Pulmonary artery systolic pressure, No TR jet  spectral display.   FINDINGS  Left Ventricle: Left ventricular ejection fraction, by estimation, is 55  to 60%. The left ventricle has normal function. The left ventricle  demonstrates regional wall motion abnormalities. Definity contrast agent  was given IV to delineate the left  ventricular endocardial borders. The left ventricular internal cavity size  was normal in size. There is unable to determine left ventricular  hypertrophy. Left ventricular diastolic parameters are indeterminate.   Right Ventricle: The right ventricular  size is normal. No increase in  right ventricular wall thickness. Right ventricular systolic function is  normal.   Left Atrium: Left atrial size was normal in size.   Right Atrium: Right atrial size was normal in size.   Pericardium: There is no evidence of pericardial effusion. Presence of  pericardial fat pad.   Mitral Valve: The mitral valve is normal in structure. No evidence of  mitral valve regurgitation. No evidence of mitral valve stenosis.   Tricuspid Valve: The tricuspid valve is normal in structure. Tricuspid  valve regurgitation is not demonstrated. No evidence of tricuspid  stenosis.   Aortic Valve: The aortic valve is normal in structure. Aortic valve  regurgitation is not visualized. No aortic stenosis is present.   Pulmonic Valve: The pulmonic valve was normal in structure. Pulmonic valve  regurgitation is not visualized. No evidence of pulmonic stenosis.   Aorta: The aortic root is normal in size and structure.   Venous: The inferior vena cava is normal in size with greater than 50%  respiratory variability, suggesting right atrial pressure of 3 mmHg.   IAS/Shunts: No atrial level shunt detected by color flow Doppler.  Recent Labs: 10/24/2020: ALT 16; BUN 23; Creatinine, Ser 0.77; Hemoglobin 14.1; Platelets 308; Potassium 3.6; Sodium 140; TSH 2.360  Recent Lipid Panel    Component Value Date/Time   CHOL 223 (H) 10/24/2020 1616   TRIG 179 (H) 10/24/2020 1616   HDL 53 10/24/2020 1616   CHOLHDL 4.2 10/24/2020 1616   LDLCALC 138 (H) 10/24/2020 1616    Physical Exam:    VS:  BP 130/88   Pulse 88   Ht '5\' 5"'  (1.651 m)   Wt 237 lb 1.3 oz (107.5 kg)   SpO2 97%   BMI 39.45 kg/m     Wt Readings from Last 3 Encounters:  01/27/21 237 lb 1.3 oz (107.5 kg)  01/26/21 231 lb 6.4 oz (105 kg)  01/01/21 234 lb 12.8 oz (106.5 kg)     GEN: Well nourished, well developed in no acute distress HEENT: Normal NECK: No JVD; No carotid bruits LYMPHATICS: No  lymphadenopathy CARDIAC: S1S2 noted,RRR, no murmurs, rubs, gallops RESPIRATORY:  Clear to auscultation without rales, wheezing or rhonchi  ABDOMEN: Soft, non-tender, non-distended, +bowel sounds, no guarding. EXTREMITIES: No edema, No cyanosis, no clubbing MUSCULOSKELETAL:  No deformity  SKIN: Warm and dry NEUROLOGIC:  Alert and oriented x 3, non-focal PSYCHIATRIC:  Normal affect, good insight  ASSESSMENT:    1. Benign essential hypertension   2. Diabetes mellitus due to underlying condition with hyperosmolarity without coma, without long-term current use of insulin ([place])   3. Mixed hyperlipidemia   4. Obesity (BMI 30-39.9)    PLAN:     We talked about  her echocardiogram results as well as her coronary CTA.  At this time there is no need for any further diagnostic testing.  She does report resolution of symptoms.  We will continue to monitor.  Blood pressure is acceptable, continue with current antihypertensive regimen.  This is being managed by his primary care doctor.  No adjustments for antidiabetic medications were made today.  The patient understands the need to lose weight with diet and exercise. We have discussed specific strategies for this.  Continue patient on her atorvastatin.  The patient is in agreement with the above plan. The patient left the office in stable condition.  The patient will follow up in   Medication Adjustments/Labs and Tests Ordered: Current medicines are reviewed at length with the patient today.  Concerns regarding medicines are outlined above.  No orders of the defined types were placed in this encounter.  No orders of the defined types were placed in this encounter.   There are no Patient Instructions on file for this visit.   Adopting a Healthy Lifestyle.  Know what a healthy weight is for you (roughly BMI <25) and aim to maintain this   Aim for 7+ servings of fruits and vegetables daily   65-80+ fluid ounces of water or unsweet tea  for healthy kidneys   Limit to max 1 drink of alcohol per day; avoid smoking/tobacco   Limit animal fats in diet for cholesterol and heart health - choose grass fed whenever available   Avoid highly processed foods, and foods high in saturated/trans fats   Aim for low stress - take time to unwind and care for your mental health   Aim for 150 min of moderate intensity exercise weekly for heart health, and weights twice weekly for bone health   Aim for 7-9 hours of sleep daily   When it comes to diets, agreement about the perfect plan isnt easy to find, even among the experts. Experts at the [employer] developed an idea known as the Healthy Eating Plate. Just imagine a plate divided into logical, healthy portions.   The emphasis is on diet quality:   Load up on vegetables and fruits - one-half of your plate: Aim for color and variety, and remember that potatoes dont count.   Go for whole grains - one-quarter of your plate: Whole wheat, barley, wheat berries, quinoa, oats, brown rice, and foods made with them. If you want pasta, go with whole wheat pasta.   Protein power - one-quarter of your plate: Fish, chicken, beans, and nuts are all healthy, versatile protein sources. Limit red meat.   The diet, however, does go beyond the plate, offering a few other suggestions.   Use healthy plant oils, such as olive, canola, soy, corn, sunflower and peanut. Check the labels, and avoid partially hydrogenated oil, which have unhealthy trans fats.   If youre thirsty, drink water. Coffee and tea are good in moderation, but skip sugary drinks and limit milk and dairy products to one or two daily servings.   The type of carbohydrate in the diet is more important than the amount. Some sources of carbohydrates, such as vegetables, fruits, whole grains, and beans-are healthier than others.   Finally, stay active  Signed, Berniece Salines, DO  01/27/2021 4:44 PM    [facility]

## 2021-01-27 NOTE — Progress Notes (Signed)
Thyroid normal.   Diabetes discussed in office.

## 2021-01-27 NOTE — Patient Instructions (Signed)
Medication Instructions:  Your physician recommends that you continue on your current medications as directed. Please refer to the Current Medication list given to you today.  *If you need a refill on your cardiac medications before your next appointment, please call your pharmacy*   Lab Work: None If you have labs (blood work) drawn today and your tests are completely normal, you will receive your results only by: Marland Kitchen MyChart Message (if you have MyChart) OR . A paper copy in the mail If you have any lab test that is abnormal or we need to change your treatment, we will call you to review the results.   Testing/Procedures: None   Follow-Up: At [facility], you and your health needs are our priority.  As part of our continuing mission to provide you with exceptional heart care, we have created designated Provider Care Teams.  These Care Teams include your primary Cardiologist (physician) and Advanced Practice Providers (APPs -  Physician Assistants and Nurse Practitioners) who all work together to provide you with the care you need, when you need it.  We recommend signing up for the patient portal called "MyChart".  Sign up information is provided on this After Visit Summary.  MyChart is used to connect with patients for Virtual Visits (Telemedicine).  Patients are able to view lab/test results, encounter notes, upcoming appointments, etc.  Non-urgent messages can be sent to your provider as well.   To learn more about what you can do with MyChart, go to [email or web address].    Your next appointment:   1 year(s)  The format for your next appointment:   In Person  Provider:   Berniece Salines, DO   Other Instructions  Diabetes Mellitus and Nutrition, Adult When you have diabetes, or diabetes mellitus, it is very important to have healthy eating habits because your blood sugar (glucose) levels are greatly affected by what you eat and drink. Eating healthy foods in the right  amounts, at about the same times every day, can help you:  Control your blood glucose.  Lower your risk of heart disease.  Improve your blood pressure.  Reach or maintain a healthy weight. What can affect my meal plan? Every person with diabetes is different, and each person has different needs for a meal plan. Your health care provider may recommend that you work with a dietitian to make a meal plan that is best for you. Your meal plan may vary depending on factors such as:  The calories you need.  The medicines you take.  Your weight.  Your blood glucose, blood pressure, and cholesterol levels.  Your activity level.  Other health conditions you have, such as heart or kidney disease. How do carbohydrates affect me? Carbohydrates, also called carbs, affect your blood glucose level more than any other type of food. Eating carbs naturally raises the amount of glucose in your blood. Carb counting is a method for keeping track of how many carbs you eat. Counting carbs is important to keep your blood glucose at a healthy level, especially if you use insulin or take certain oral diabetes medicines. It is important to know how many carbs you can safely have in each meal. This is different for every person. Your dietitian can help you calculate how many carbs you should have at each meal and for each snack. How does alcohol affect me? Alcohol can cause a sudden decrease in blood glucose (hypoglycemia), especially if you use insulin or take certain oral diabetes medicines. Hypoglycemia can be  a life-threatening condition. Symptoms of hypoglycemia, such as sleepiness, dizziness, and confusion, are similar to symptoms of having too much alcohol.  Do not drink alcohol if: ? Your health care provider tells you not to drink. ? You are pregnant, may be pregnant, or are planning to become pregnant.  If you drink alcohol: ? Do not drink on an empty stomach. ? Limit how much you use to:  0-1 drink  a day for women.  0-2 drinks a day for men. ? Be aware of how much alcohol is in your drink. In the [place] one drink equals one 12 oz bottle of beer (355 mL), one 5 oz glass of wine (148 mL), or one 1 oz glass of hard liquor (44 mL). ? Keep yourself hydrated with water, diet soda, or unsweetened iced tea.  Keep in mind that regular soda, juice, and other mixers may contain a lot of sugar and must be counted as carbs. What are tips for following this plan? Reading food labels  Start by checking the serving size on the "Nutrition Facts" label of packaged foods and drinks. The amount of calories, carbs, fats, and other nutrients listed on the label is based on one serving of the item. Many items contain more than one serving per package.  Check the total grams (g) of carbs in one serving. You can calculate the number of servings of carbs in one serving by dividing the total carbs by 15. For example, if a food has 30 g of total carbs per serving, it would be equal to 2 servings of carbs.  Check the number of grams (g) of saturated fats and trans fats in one serving. Choose foods that have a low amount or none of these fats.  Check the number of milligrams (mg) of salt (sodium) in one serving. Most people should limit total sodium intake to less than 2,300 mg per day.  Always check the nutrition information of foods labeled as "low-fat" or "nonfat." These foods may be higher in added sugar or refined carbs and should be avoided.  Talk to your dietitian to identify your daily goals for nutrients listed on the label. Shopping  Avoid buying canned, pre-made, or processed foods. These foods tend to be high in fat, sodium, and added sugar.  Shop around the outside edge of the grocery store. This is where you will most often find fresh fruits and vegetables, bulk grains, fresh meats, and fresh dairy. Cooking  Use low-heat cooking methods, such as baking, instead of high-heat cooking methods like  deep frying.  Cook using healthy oils, such as olive, canola, or sunflower oil.  Avoid cooking with butter, cream, or high-fat meats. Meal planning  Eat meals and snacks regularly, preferably at the same times every day. Avoid going long periods of time without eating.  Eat foods that are high in fiber, such as fresh fruits, vegetables, beans, and whole grains. Talk with your dietitian about how many servings of carbs you can eat at each meal.  Eat 4-6 oz (112-168 g) of lean protein each day, such as lean meat, chicken, fish, eggs, or tofu. One ounce (oz) of lean protein is equal to: ? 1 oz (28 g) of meat, chicken, or fish. ? 1 egg. ?  cup (62 g) of tofu.  Eat some foods each day that contain healthy fats, such as avocado, nuts, seeds, and fish.   What foods should I eat? Fruits Berries. Apples. Oranges. Peaches. Apricots. Plums. Grapes. Mango. Papaya. Pomegranate.  Kiwi. Cherries. Vegetables Lettuce. Spinach. Leafy greens, including kale, chard, collard greens, and mustard greens. Beets. Cauliflower. Cabbage. Broccoli. Carrots. Green beans. Tomatoes. Peppers. Onions. Cucumbers. Brussels sprouts. Grains Whole grains, such as whole-wheat or whole-grain bread, crackers, tortillas, cereal, and pasta. Unsweetened oatmeal. Quinoa. Brown or wild rice. Meats and other proteins Seafood. Poultry without skin. Lean cuts of poultry and beef. Tofu. Nuts. Seeds. Dairy Low-fat or fat-free dairy products such as milk, yogurt, and cheese. The items listed above may not be a complete list of foods and beverages you can eat. Contact a dietitian for more information. What foods should I avoid? Fruits Fruits canned with syrup. Vegetables Canned vegetables. Frozen vegetables with butter or cream sauce. Grains Refined white flour and flour products such as bread, pasta, snack foods, and cereals. Avoid all processed foods. Meats and other proteins Fatty cuts of meat. Poultry with skin. Breaded or fried  meats. Processed meat. Avoid saturated fats. Dairy Full-fat yogurt, cheese, or milk. Beverages Sweetened drinks, such as soda or iced tea. The items listed above may not be a complete list of foods and beverages you should avoid. Contact a dietitian for more information. Questions to ask a health care provider  Do I need to meet with a diabetes educator?  Do I need to meet with a dietitian?  What number can I call if I have questions?  When are the best times to check my blood glucose? Where to find more information:  American Diabetes Association: diabetes.org  Academy of Nutrition and Dietetics: www.eatright.[employer] of Diabetes and Digestive and Kidney Diseases: [employer].dk  Association of Diabetes Care and Education Specialists: www.diabeteseducator.org Summary  It is important to have healthy eating habits because your blood sugar (glucose) levels are greatly affected by what you eat and drink.  A healthy meal plan will help you control your blood glucose and maintain a healthy lifestyle.  Your health care provider may recommend that you work with a dietitian to make a meal plan that is best for you.  Keep in mind that carbohydrates (carbs) and alcohol have immediate effects on your blood glucose levels. It is important to count carbs and to use alcohol carefully. This information is not intended to replace advice given to you by your health care provider. Make sure you discuss any questions you have with your health care provider. Document Revised: 09/04/2019 Document Reviewed: 09/04/2019 Elsevier Patient Education  2021 [employer].

## 2021-02-23 ENCOUNTER — Telehealth: Payer: Self-pay | Admitting: Family

## 2021-02-23 NOTE — Telephone Encounter (Signed)
Pt called in stating she needs a refill of her oxybutynin (DITROPAN-XL) 10 MG 24 hr tablet . Pt is requesting 90 day supply.  Pharmacy  [id number] - [place], [address], [place] [address]  Phone:  [phone] Fax:  [phone]  DEA #:  [id number]

## 2021-02-24 NOTE — Telephone Encounter (Signed)
Spoke w/pt will call pharmacy to verify why she was not dispensed 120 tablets on 11/25/20 instead of 30 day supply, refill not due till after 03/25/21

## 2021-02-25 ENCOUNTER — Encounter: Payer: Self-pay | Admitting: Family

## 2021-02-25 ENCOUNTER — Other Ambulatory Visit: Payer: Self-pay

## 2021-02-25 DIAGNOSIS — N3289 Other specified disorders of bladder: Secondary | ICD-10-CM

## 2021-02-25 DIAGNOSIS — N3281 Overactive bladder: Secondary | ICD-10-CM

## 2021-02-25 MED ORDER — OXYBUTYNIN CHLORIDE ER 10 MG PO TB24: 10.0000 mg | ORAL_TABLET | Freq: Every day | ORAL | 0 refills | Status: DC

## 2021-02-25 NOTE — Telephone Encounter (Signed)
There are several options we can discuss to assist with weight management. Please call our office and schedule an in-person appointment to see me soon.

## 2021-02-25 NOTE — Progress Notes (Signed)
Oxybutyin reordered

## 2021-03-03 ENCOUNTER — Other Ambulatory Visit: Payer: Self-pay

## 2021-03-03 DIAGNOSIS — E119 Type 2 diabetes mellitus without complications: Secondary | ICD-10-CM

## 2021-03-03 MED ORDER — METFORMIN HCL 500 MG PO TABS: 500.0000 mg | ORAL_TABLET | Freq: Every day | ORAL | 0 refills | Status: DC

## 2021-04-13 ENCOUNTER — Other Ambulatory Visit: Payer: Self-pay | Admitting: Family

## 2021-04-13 DIAGNOSIS — E785 Hyperlipidemia, unspecified: Secondary | ICD-10-CM

## 2021-04-13 DIAGNOSIS — K219 Gastro-esophageal reflux disease without esophagitis: Secondary | ICD-10-CM

## 2021-04-14 ENCOUNTER — Other Ambulatory Visit: Payer: Self-pay | Admitting: Family

## 2021-04-14 DIAGNOSIS — I10 Essential (primary) hypertension: Secondary | ICD-10-CM

## 2021-04-14 MED ORDER — HYDROCHLOROTHIAZIDE 12.5 MG PO TABS
12.5000 mg | ORAL_TABLET | Freq: Every day | ORAL | 0 refills | Status: DC
Start: 2021-04-14 — End: 2021-07-13

## 2021-04-26 NOTE — Progress Notes (Signed)
Patient ID: Ashley Mcconnell, female    DOB: May 11, 1961  MRN: [id number]  CC: Diabetes Follow-Up  Subjective: Ashley Mcconnell is a 60 y.o. female who presents for diabetes follow-up.  Her concerns today include:   DIABETES TYPE 2 FOLLOW-UP: 01/26/2021: - Hemoglobin A1c today at goal at 6.4%, goal < 7%. This is similar to previous hemoglobin A1c of 6.5% on 10/24/2020. Next hemoglobin A1c due July 2022. - Continue Metformin as prescribed.  04/27/2021: Doing well on current regimen. No side effects. Home blood sugars mostly 140's to 160's.  2. WEIGHT MANAGEMENT: Would like to begin medication to assist with weight management. Plans to discuss more with her heart doctor before making a decision.  3. RASH: Located at inner thigh near vaginal area. Requesting refill of Triamcinolone.   4. VAGINAL DISCHARGE: Clear vaginal discharge with mild odor.   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   Can't get food down 10/29/2020   Fatigue 10/29/2020   Hemorrhoids, internal 10/29/2020   History of colon polyps 10/29/2020   Infection of the upper respiratory tract 10/29/2020   Interdigital neuralgia 10/29/2020   Pulmonary embolism ([place])    Morton's neuroma    Migraine    Hypertension    Cardiomyopathy ([place])    Type 2 diabetes mellitus ([place]) 10/25/2020   Hyperlipemia 10/25/2020   Blood in urine 07/14/2020   Nephrolithiasis 10/10/2018   Benign essential hypertension 06/01/2018   Family history of colon cancer requiring screening colonoscopy  02/09/2018   Family history of colonic polyps 02/09/2018   Hyperglycemia 11/09/2017   Acute infection of nasal sinus 12/10/2015   Abdominal pain, right upper quadrant 12/10/2015   Prediabetes 12/10/2015   Chill 12/10/2015   FOM (frequency of micturition) 12/10/2015   Dyspnea 07/25/2015   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   GERD (gastroesophageal reflux disease) 07/25/2015   Obesity 07/25/2015   Chest wall pain 07/25/2015   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 90 tablet 0   omeprazole (PRILOSEC) 20 MG capsule TAKE 1 CAPSULE BY MOUTH EVERY DAY 90 capsule 1   ondansetron (ZOFRAN ODT) 4 MG disintegrating tablet Take 1 tablet (4 mg total) by mouth every 8 (eight) hours as needed for nausea or vomiting. 20 tablet 0   oxybutynin (DITROPAN-XL) 10 MG 24 hr tablet Take 1 tablet (10 mg total) by mouth daily. 120 tablet 0   TRUEplus Lancets 28G MISC Use as directed 100 each 4   No current facility-administered medications on file prior to visit.  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   Tobacco Use   Smoking status: Former    Packs/day: 0.20    Years: 2.00    Pack years: 0.40    Types: Cigarettes    Quit date: 07/24/1986    Years since quitting: 34.7   Smokeless tobacco: Never   Tobacco comments:    social smoker  Substance and Sexual Activity   Alcohol use: Yes    Alcohol/week: 0.0 standard drinks    Comment: 1 drink on New Year's Eve   Drug use: No   Sexual activity: Not Currently    Birth control/protection: Surgical  Other Topics Concern   Not on file  Social History Narrative   Patient lives at home with her family.   Caffeine Use: 1 cup of tea two times weekly   Social Determinants of Health   Financial Resource Strain: Not on file  Food Insecurity: Not on file  Transportation Needs: Not on file  Physical Activity: Not on file  Stress: Not on file  Social Connections: Not on file  Intimate Partner Violence: Not on file    Family History  Problem Relation Age of Onset   Breast cancer Mother    Stroke Father    Heart Problems Father        heart bypass   Alzheimer's disease Paternal Aunt    Alzheimer's disease Paternal Uncle    Heart Problems Paternal Grandmother        enlarged heart   Alzheimer's disease Paternal Aunt    Alzheimer's disease Paternal Aunt    Cancer Sister        colorectal    Past Surgical History:  Procedure Laterality Date   ABDOMINAL HYSTERECTOMY     CARDIAC SURGERY  04/2014   Catherization    CHOLECYSTECTOMY     CYSTOSCOPY/URETEROSCOPY/HOLMIUM LASER/STENT PLACEMENT Left 10/10/2018   Procedure: CYSTOSCOPY/URETEROSCOPY/HOLMIUM LASER/STENT PLACEMENT;  Surgeon: Ceasar Mons, MD;  Location: [facility];  Service: Urology;  Laterality: Left;   LITHOTRIPSY     x5   TONSILLECTOMY  1969    ROS: Review of Systems Negative except as stated above  PHYSICAL EXAM: Temp 98 F (36.7 C)   Resp 17   Ht _0  (1.651 m)   Wt 233 lb (105.7 kg)    BMI 38.77 kg/m   Physical Exam HENT:     Head: Normocephalic and atraumatic.  Eyes:     Extraocular Movements: Extraocular movements intact.     Conjunctiva/sclera: Conjunctivae normal.  Cardiovascular:     Rate and Rhythm: Normal rate and regular rhythm.     Pulses: Normal pulses.     Heart sounds: Normal heart sounds.  Pulmonary:     Effort: Pulmonary effort is normal.     Breath sounds: Normal breath sounds.  Musculoskeletal:     Cervical back: Normal range of motion and neck supple.  Neurological:     General: No focal deficit present.  Mental Status: She is alert and oriented to person, place, and time.  Psychiatric:        Mood and Affect: Mood normal.        Behavior: Behavior normal.   ASSESSMENT AND PLAN: 1. Type 2 diabetes mellitus without complication, without long-term current use of insulin ([place]): - Hemoglobin A1c at goal today at 6.3%, goal < 7%. This is about the same as previous hemoglobin A1c of 6.4% on 01/26/2021. Next hemoglobin A1c due October 2022.  - Continue Metformin as prescribed.  - Discussed the importance of healthy eating habits, low-carbohydrate diet, low-sugar diet, regular aerobic exercise (at least 150 minutes a week as tolerated) and medication compliance to achieve or maintain control of diabetes. - Follow-up with primary provider in 3 months or sooner if needed.  - POCT glycosylated hemoglobin (Hb A1C) - metFORMIN (GLUCOPHAGE) 500 MG tablet; Take 1 tablet (500 mg total) by mouth at bedtime.  Dispense: 90 tablet; Refill: 0  2. Encounter for weight management: 3. Class 2 severe obesity with serious comorbidity and body mass index (BMI) of 38.0 to 38.9 in adult, unspecified obesity type [facility]): - Patient prefers to consult with Cardiology before beginning medication regimen. - Follow-up with primary provider as scheduled.   4. Rash and nonspecific skin eruption: - Continue Triamcinolone ointment as prescribed.  - Follow-up with primary  provider as scheduled. - triamcinolone ointment (KENALOG) 0.1 %; Apply 1 application topically 2 (two) times daily as needed (itching).  Dispense: 80 g; Refill: 1  5. Vaginal itching: - Cervicovaginal self-swab to screen for chlamydia, gonorrhea, trichomonas, bacterial vaginitis, and candida vaginitis. - Cervicovaginal ancillary only    Patient was given the opportunity to ask questions.  Patient verbalized understanding of the plan and was able to repeat key elements of the plan. Patient was given clear instructions to go to Emergency Department or return to medical center if symptoms don't improve, worsen, or new problems develop.The patient verbalized understanding.   Orders Placed This Encounter  Procedures   POCT glycosylated hemoglobin (Hb A1C)     Requested Prescriptions   Signed Prescriptions Disp Refills   metFORMIN (GLUCOPHAGE) 500 MG tablet 90 tablet 0    Sig: Take 1 tablet (500 mg total) by mouth at bedtime.   triamcinolone ointment (KENALOG) 0.1 % 80 g 1    Sig: Apply 1 application topically 2 (two) times daily as needed (itching).    Follow-up with primary in 3 months for diabetes follow-up.   Camillia Herter, NP

## 2021-04-27 ENCOUNTER — Ambulatory Visit (INDEPENDENT_AMBULATORY_CARE_PROVIDER_SITE_OTHER): Payer: Managed Care, Other (non HMO) | Admitting: Family

## 2021-04-27 ENCOUNTER — Other Ambulatory Visit: Payer: Self-pay

## 2021-04-27 ENCOUNTER — Other Ambulatory Visit (HOSPITAL_COMMUNITY)
Admission: RE | Admit: 2021-04-27 | Discharge: 2021-04-27 | Disposition: A | Discharge status: Home / Self Care | Payer: Managed Care, Other (non HMO) | Source: Ambulatory Visit | Attending: Family | Admitting: Family

## 2021-04-27 ENCOUNTER — Encounter: Payer: Self-pay | Admitting: Family

## 2021-04-27 VITALS — Temp 98.0°F | Resp 17 | Ht 65.0 in | Wt 233.0 lb

## 2021-04-27 DIAGNOSIS — N898 Other specified noninflammatory disorders of vagina: Secondary | ICD-10-CM | POA: Insufficient documentation

## 2021-04-27 DIAGNOSIS — R21 Rash and other nonspecific skin eruption: Secondary | ICD-10-CM

## 2021-04-27 DIAGNOSIS — Z6838 Body mass index (BMI) 38.0-38.9, adult: Secondary | ICD-10-CM

## 2021-04-27 DIAGNOSIS — E119 Type 2 diabetes mellitus without complications: Secondary | ICD-10-CM

## 2021-04-27 DIAGNOSIS — E66812 Obesity, class 2: Secondary | ICD-10-CM

## 2021-04-27 DIAGNOSIS — Z7689 Persons encountering health services in other specified circumstances: Secondary | ICD-10-CM

## 2021-04-27 LAB — POCT GLYCOSYLATED HEMOGLOBIN (HGB A1C): Hemoglobin A1C: 6.3 % — AB (ref 4.0–5.6)

## 2021-04-27 MED ORDER — METFORMIN HCL 500 MG PO TABS
500.0000 mg | ORAL_TABLET | Freq: Every day | ORAL | 0 refills | Status: DC
Start: 2021-04-27 — End: 2021-04-27

## 2021-04-27 MED ORDER — METFORMIN HCL 500 MG PO TABS: 500.0000 mg | ORAL_TABLET | Freq: Every day | ORAL | 0 refills | Status: DC

## 2021-04-27 MED ORDER — TRIAMCINOLONE ACETONIDE 0.1 % EX OINT
1.0000 | TOPICAL_OINTMENT | Freq: Two times a day (BID) | CUTANEOUS | 1 refills | Status: AC | PRN
Start: 2021-04-27 — End: ?

## 2021-04-27 NOTE — Progress Notes (Signed)
Pt presents for diabetes follow-up  Pt experiencing vaginal itching and causing rash along inner thigh req triamcinolone ointment refill  Vaginal self-swab completed

## 2021-04-28 ENCOUNTER — Other Ambulatory Visit: Payer: Self-pay | Admitting: Family

## 2021-04-28 ENCOUNTER — Encounter: Payer: Self-pay | Admitting: Family

## 2021-04-28 DIAGNOSIS — B9689 Other specified bacterial agents as the cause of diseases classified elsewhere: Secondary | ICD-10-CM | POA: Insufficient documentation

## 2021-04-28 DIAGNOSIS — N760 Acute vaginitis: Secondary | ICD-10-CM | POA: Insufficient documentation

## 2021-04-28 LAB — CERVICOVAGINAL ANCILLARY ONLY
Bacterial Vaginitis (gardnerella): POSITIVE — AB
Candida Glabrata: NEGATIVE
Candida Vaginitis: NEGATIVE
Chlamydia: NEGATIVE
Comment: NEGATIVE
Comment: NEGATIVE
Comment: NEGATIVE
Comment: NEGATIVE
Comment: NEGATIVE
Comment: NORMAL
Neisseria Gonorrhea: NEGATIVE
Trichomonas: NEGATIVE

## 2021-04-28 MED ORDER — METRONIDAZOLE 500 MG PO TABS: 500.0000 mg | ORAL_TABLET | Freq: Two times a day (BID) | ORAL | 0 refills | Status: AC

## 2021-04-28 NOTE — Progress Notes (Signed)
Gonorrhea, Chlamydia, Trichomonas, Candida Vaginitis (yeast infection) negative.   Positive for Bacterial Vaginitis, an overgrowth of normal bacteria in the vagina due to changes in pH often related to semen, menstrual periods, or soaps. Prescribed Metronidazole (Flagyl) twice per day for 7 days. Do not drink alcohol while taking this medication.

## 2021-05-13 ENCOUNTER — Other Ambulatory Visit: Payer: Self-pay | Admitting: Family

## 2021-05-13 ENCOUNTER — Encounter: Payer: Self-pay | Admitting: Family

## 2021-05-13 DIAGNOSIS — Z7689 Persons encountering health services in other specified circumstances: Secondary | ICD-10-CM

## 2021-05-13 MED ORDER — PHENTERMINE HCL 15 MG PO CAPS: 15.0000 mg | ORAL_CAPSULE | ORAL | 0 refills | Status: DC

## 2021-05-13 NOTE — Progress Notes (Signed)
Phentermine prescribed per patient request. She may come to our office at her convenience and receive the printed prescription from [facility]. I did check the [facility] prescription drug database and found no frequent prescribers of opiates or evidence of aberrant behavior.

## 2021-05-28 ENCOUNTER — Other Ambulatory Visit: Payer: Self-pay

## 2021-05-28 ENCOUNTER — Encounter: Payer: Self-pay | Admitting: Family

## 2021-05-28 DIAGNOSIS — N3281 Overactive bladder: Secondary | ICD-10-CM

## 2021-05-28 DIAGNOSIS — N3289 Other specified disorders of bladder: Secondary | ICD-10-CM

## 2021-05-28 MED ORDER — OXYBUTYNIN CHLORIDE ER 10 MG PO TB24: 10.0000 mg | ORAL_TABLET | Freq: Every day | ORAL | 0 refills | Status: DC

## 2021-05-29 ENCOUNTER — Other Ambulatory Visit: Payer: Self-pay | Admitting: Family

## 2021-05-29 ENCOUNTER — Encounter: Payer: Self-pay | Admitting: Family

## 2021-05-29 DIAGNOSIS — U071 COVID-19: Secondary | ICD-10-CM

## 2021-05-29 MED ORDER — BENZONATATE 100 MG PO CAPS: 100.0000 mg | ORAL_CAPSULE | Freq: Three times a day (TID) | ORAL | 0 refills | Status: AC | PRN

## 2021-05-30 ENCOUNTER — Encounter: Payer: Self-pay | Admitting: Family

## 2021-05-30 ENCOUNTER — Other Ambulatory Visit: Payer: Self-pay | Admitting: Family

## 2021-05-30 DIAGNOSIS — U071 COVID-19: Secondary | ICD-10-CM | POA: Insufficient documentation

## 2021-05-30 MED ORDER — NIRMATRELVIR/RITONAVIR (PAXLOVID)TABLET
3.0000 | ORAL_TABLET | Freq: Two times a day (BID) | ORAL | 0 refills | Status: AC
Start: 2021-05-30 — End: 2021-06-04

## 2021-06-03 ENCOUNTER — Other Ambulatory Visit: Payer: Self-pay | Admitting: Family

## 2021-06-03 DIAGNOSIS — E119 Type 2 diabetes mellitus without complications: Secondary | ICD-10-CM

## 2021-06-08 MED ORDER — METFORMIN HCL 500 MG PO TABS: 500.0000 mg | ORAL_TABLET | Freq: Every day | ORAL | 0 refills | Status: DC

## 2021-06-20 ENCOUNTER — Other Ambulatory Visit: Payer: Self-pay | Admitting: Family

## 2021-06-20 DIAGNOSIS — Z7689 Persons encountering health services in other specified circumstances: Secondary | ICD-10-CM

## 2021-07-11 NOTE — Progress Notes (Signed)
Patient ID: Ashley Mcconnell, female    DOB: 04-28-1961  MRN: [id number]  CC: Diabetes Follow-Up   Subjective: Ashley Mcconnell is a 60 y.o. female who presents for diabetes follow-up.   Her concerns today include:   DIABETES TYPE 2 FOLLOW-UP: 04/27/2021: - Hemoglobin A1c at goal today at 6.3%, goal < 7%. This is about the same as previous hemoglobin A1c of 6.4% on 01/26/2021. Next hemoglobin A1c due October 2022.  - Continue Metformin as prescribed.   07/13/2021: Home blood sugars fasting 160's-220's. Doing well on Metformin.   2. HYPERTENSION FOLLOW-UP: Doing well on current regimen. No side effects. No issues/concerns. Denies chest pain and shortness of breath. Home blood pressures similar to today's office reading.  3. WEIGHT MANAGEMENT FOLLOW-UP: Doing well on current regimen. Reports she did not take for about 4 weeks when she had Covid also due to needing a refill. Reports has not changed any significant lifestyle modifications.    Wt Readings from Last 3 Encounters:  07/13/21 229 lb 3.2 oz (104 kg)  04/27/21 233 lb (105.7 kg)  01/27/21 237 lb 1.3 oz (107.5 kg)   4. SKIN CONCERN: Reports what she thought was a dry patch present for at least 12 months. Located at left shoulder. Recently spot became raised and itching. Denies pain and drainage. Concern for skin cancer as she has family history in father and mother.   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   Can't get food down 10/29/2020   Fatigue 10/29/2020    Hemorrhoids, internal 10/29/2020   History of colon polyps 10/29/2020   Infection of the upper respiratory tract 10/29/2020   Interdigital neuralgia 10/29/2020   Pulmonary embolism ([place])    Morton's neuroma    Migraine    Hypertension    Cardiomyopathy ([place])    Type 2 diabetes mellitus ([place]) 10/25/2020   Hyperlipemia 10/25/2020   Blood in urine 07/14/2020   Nephrolithiasis 10/10/2018   Benign essential hypertension 06/01/2018   Family history of colon cancer requiring screening colonoscopy 02/09/2018   Family history of colonic polyps 02/09/2018   Hyperglycemia 11/09/2017   Acute infection of nasal sinus 12/10/2015   Abdominal pain, right upper quadrant 12/10/2015   Prediabetes 12/10/2015   Chill 12/10/2015   FOM (frequency of micturition) 12/10/2015   Dyspnea 07/25/2015   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   GERD (gastroesophageal reflux disease) 07/25/2015   Obesity 07/25/2015   Chest wall pain 07/25/2015   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   Calculus of kidney 02/06/2013   Left flank pain 02/06/2013   Leukocytosis 02/06/2013     Current Outpatient Medications on File Prior to Visit  Medication Sig Dispense Refill   atorvastatin (LIPITOR) 20 MG tablet TAKE 1 TABLET BY MOUTH EVERY DAY 90 tablet 1   Blood Glucose Monitoring Suppl (TRUE METRIX METER) w/Device KIT Use as directed 1 kit 0   glucose blood (TRUE METRIX BLOOD GLUCOSE TEST) test strip Use as instructed 100 each 12   omeprazole (PRILOSEC) 20 MG capsule TAKE 1 CAPSULE BY MOUTH EVERY DAY 90  capsule 1   ondansetron (ZOFRAN ODT) 4 MG disintegrating tablet Take 1 tablet (4 mg total) by mouth every 8 (eight) hours as needed for nausea or vomiting. 20 tablet 0   oxybutynin (DITROPAN-XL) 10 MG 24 hr tablet Take 1 tablet (10 mg total) by mouth daily. 120 tablet 0   triamcinolone ointment (KENALOG) 0.1 % Apply 1 application topically 2 (two) times daily as needed (itching). 80 g 1   TRUEplus Lancets 28G MISC Use  as directed 100 each 4   No current facility-administered medications on file prior to visit.    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   Tobacco Use   Smoking status: Former    Packs/day: 0.20    Years: 2.00    Pack years: 0.40    Types: Cigarettes    Quit date: 07/24/1986    Years since quitting: 34.9   Smokeless tobacco: Never   Tobacco comments:    social smoker  Substance and Sexual Activity   Alcohol use: Yes    Alcohol/week: 0.0 standard drinks    Comment: 1 drink on New Year's Eve   Drug use: No   Sexual activity: Not Currently    Birth control/protection: Surgical  Other Topics Concern   Not on file  Social History Narrative   Patient lives at home with her family.   Caffeine Use: 1 cup of tea two times weekly   Social Determinants of Health   Financial Resource Strain: Not on file  Food Insecurity: Not on file  Transportation Needs: Not on file  Physical Activity: Not on file  Stress: Not on file  Social Connections: Not on file  Intimate Partner Violence: Not on file    Family History  Problem Relation Age of Onset   Breast cancer Mother    Stroke Father    Heart Problems Father        heart bypass   Alzheimer's disease Paternal Aunt    Alzheimer's disease Paternal Uncle    Heart Problems Paternal Grandmother        enlarged heart   Alzheimer's disease Paternal Aunt    Alzheimer's disease Paternal Aunt    Cancer Sister        colorectal    Past Surgical History:  Procedure Laterality Date   ABDOMINAL HYSTERECTOMY      CARDIAC SURGERY  04/2014   Catherization    CHOLECYSTECTOMY     CYSTOSCOPY/URETEROSCOPY/HOLMIUM LASER/STENT PLACEMENT Left 10/10/2018   Procedure: CYSTOSCOPY/URETEROSCOPY/HOLMIUM LASER/STENT PLACEMENT;  Surgeon: Ceasar Mons, MD;  Location: [facility];  Service: Urology;  Laterality: Left;   LITHOTRIPSY     x5   TONSILLECTOMY  1969    ROS: Review of Systems Negative except as stated above  PHYSICAL EXAM: BP 113/76 (BP Location: Left Arm, Patient Position: Sitting, Cuff Size: Large)   Pulse 95   Temp 98.4 F (36.9 C)   Resp 18   Ht _0  (1.651 m)  Wt 229 lb 3.2 oz (104 kg)   SpO2 97%   BMI 38.14 kg/m    Physical Exam HENT:     Head: Normocephalic and atraumatic.  Eyes:     Extraocular Movements: Extraocular movements intact.     Conjunctiva/sclera: Conjunctivae normal.     Pupils: Pupils are equal, round, and reactive to light.  Cardiovascular:     Rate and Rhythm: Normal rate and regular rhythm.     Pulses: Normal pulses.     Heart sounds: Normal heart sounds.  Pulmonary:     Effort: Pulmonary effort is normal.     Breath sounds: Normal breath sounds.  Musculoskeletal:     Cervical back: Normal range of motion and neck supple.  Skin:    Comments: Papular hyperpigmented skin lesion left posterior shoulder. No evidence of drainage or compromise in skin integrity.   Neurological:     General: No focal deficit present.     Mental Status: She is alert and oriented to person, place, and time.  Psychiatric:        Mood and Affect: Mood normal.        Behavior: Behavior normal.    Results for orders placed or performed in visit on 07/13/21  POCT glycosylated hemoglobin (Hb A1C)  Result Value Ref Range   Hemoglobin A1C 6.7 (A) 4.0 - 5.6 %   HbA1c POC (<> result, manual entry)     HbA1c, POC (prediabetic range)     HbA1c, POC (controlled diabetic range)       ASSESSMENT AND PLAN: 1. Type 2 diabetes mellitus without complication, without long-term  current use of insulin ([place]): - Hemoglobin A1c today at goal at 6.7%, goal < 7%. However, this is increased from previous hemoglobin A1c of 6.3% on 04/27/2021. Next hemoglobin A1c due January 2023.  - Continue Metformin as prescribed.  - Discussed the importance of healthy eating habits, low-carbohydrate diet, low-sugar diet, regular aerobic exercise (at least 150 minutes a week as tolerated) and medication compliance to achieve or maintain control of diabetes. - Follow-up with primary provider in 3 months or sooner if needed.  - POCT glycosylated hemoglobin (Hb A1C) - metFORMIN (GLUCOPHAGE) 500 MG tablet; Take 1 tablet (500 mg total) by mouth at bedtime.  Dispense: 90 tablet; Refill: 0  2. Essential hypertension: - Continue Hydrochlorothiazide as prescribed.  - Counseled on blood pressure goal of less than 130/80, low-sodium, DASH diet, medication compliance, 150 minutes of moderate intensity exercise per week as tolerated. Discussed medication compliance, adverse effects. - BMP to evaluate kidney function and electrolyte balance. - Follow-up with primary provider in 3 months or sooner if needed.  - hydrochlorothiazide (HYDRODIURIL) 12.5 MG tablet; Take 1 tablet (12.5 mg total) by mouth daily.  Dispense: 90 tablet; Refill: 0 - Basic Metabolic Panel  3. Encounter for weight management: - Continue Phentermine as prescribed.  - Counseled on low-sodium, DASH diet, medication compliance, and 150 minutes of moderate intensity exercise per week as tolerated. Discussed medication compliance, adverse effects. - Counseled regarding Phentermine holiday approaching within the next couple months. Patient verbalized understanding.  - Follow-up with primary provider in 4 weeks or sooner if needed.  - phentermine 30 MG capsule; Take 1 capsule (30 mg total) by mouth every morning.  Dispense: 30 capsule; Refill: 0  4. Skin lesion: - Referral to Dermatology for further evaluation and management.  - Follow-up  with primary provider as scheduled.  - Ambulatory referral to Dermatology  Patient was given the opportunity to  ask questions.  Patient verbalized understanding of the plan and was able to repeat key elements of the plan. Patient was given clear instructions to go to Emergency Department or return to medical center if symptoms don't improve, worsen, or new problems develop.The patient verbalized understanding.   Orders Placed This Encounter  Procedures   Basic Metabolic Panel   Ambulatory referral to Dermatology   POCT glycosylated hemoglobin (Hb A1C)     Requested Prescriptions   Signed Prescriptions Disp Refills   hydrochlorothiazide (HYDRODIURIL) 12.5 MG tablet 90 tablet 0    Sig: Take 1 tablet (12.5 mg total) by mouth daily.   phentermine 30 MG capsule 30 capsule 0    Sig: Take 1 capsule (30 mg total) by mouth every morning.   metFORMIN (GLUCOPHAGE) 500 MG tablet 90 tablet 0    Sig: Take 1 tablet (500 mg total) by mouth at bedtime.    Return in about 3 months (around 10/13/2021) for Follow-Up or next available hypertension/diabetes and 4 weeks weight management.  Camillia Herter, NP

## 2021-07-13 ENCOUNTER — Ambulatory Visit (INDEPENDENT_AMBULATORY_CARE_PROVIDER_SITE_OTHER): Payer: Managed Care, Other (non HMO) | Admitting: Family

## 2021-07-13 ENCOUNTER — Other Ambulatory Visit: Payer: Self-pay

## 2021-07-13 VITALS — BP 113/76 | HR 95 | Temp 98.4°F | Resp 18 | Ht 65.0 in | Wt 229.2 lb

## 2021-07-13 DIAGNOSIS — Z23 Encounter for immunization: Secondary | ICD-10-CM | POA: Diagnosis not present

## 2021-07-13 DIAGNOSIS — L989 Disorder of the skin and subcutaneous tissue, unspecified: Secondary | ICD-10-CM

## 2021-07-13 DIAGNOSIS — E119 Type 2 diabetes mellitus without complications: Secondary | ICD-10-CM

## 2021-07-13 DIAGNOSIS — Z7689 Persons encountering health services in other specified circumstances: Secondary | ICD-10-CM

## 2021-07-13 DIAGNOSIS — I10 Essential (primary) hypertension: Secondary | ICD-10-CM | POA: Diagnosis not present

## 2021-07-13 LAB — POCT GLYCOSYLATED HEMOGLOBIN (HGB A1C): Hemoglobin A1C: 6.7 % — AB (ref 4.0–5.6)

## 2021-07-13 MED ORDER — METFORMIN HCL 500 MG PO TABS: 500.0000 mg | ORAL_TABLET | Freq: Every day | ORAL | 0 refills | Status: DC

## 2021-07-13 MED ORDER — HYDROCHLOROTHIAZIDE 12.5 MG PO TABS: 12.5000 mg | ORAL_TABLET | Freq: Every day | ORAL | 0 refills | Status: DC

## 2021-07-13 MED ORDER — PHENTERMINE HCL 30 MG PO CAPS: 30.0000 mg | ORAL_CAPSULE | ORAL | 0 refills | Status: DC

## 2021-07-13 NOTE — Progress Notes (Signed)
Pt presents for diabetes follow-up, pt reports spot on left shoulder been there about a year but now is raised bump Needs refill on Phentermine and hydrochlorothiazide

## 2021-07-13 NOTE — Patient Instructions (Signed)

## 2021-07-14 LAB — BASIC METABOLIC PANEL
BUN/Creatinine Ratio: 21 (ref 9–23)
BUN: 17 mg/dL (ref 6–24)
CO2: 24 mmol/L (ref 20–29)
Calcium: 9.6 mg/dL (ref 8.7–10.2)
Chloride: 102 mmol/L (ref 96–106)
Creatinine, Ser: 0.81 mg/dL (ref 0.57–1.00)
Glucose: 148 mg/dL — ABNORMAL HIGH (ref 70–99)
Potassium: 3.9 mmol/L (ref 3.5–5.2)
Sodium: 145 mmol/L — ABNORMAL HIGH (ref 134–144)
eGFR: 84 mL/min/{1.73_m2} (ref >59)

## 2021-07-14 NOTE — Progress Notes (Signed)
Kidney function normal.   Diabetes discussed in office.

## 2021-07-28 ENCOUNTER — Ambulatory Visit: Payer: Managed Care, Other (non HMO) | Admitting: Family

## 2021-08-15 NOTE — Progress Notes (Signed)
Patient ID: Ashley Mcconnell, female    DOB: September 24, 1961  MRN: [id number]  CC: Hypertension Follow-Up   Subjective: Ashley Mcconnell is a 60 y.o. female who presents for hypertension follow-up.   Her concerns today include:   HYPERTENSION FOLLOW-UP: 07/13/2021: - Continue Hydrochlorothiazide as prescribed.   08/17/2021: Doing well on current regimen. No side effects. No issues/concerns. Home blood pressure readings mostly 120's-130's/70's-80's. Had a few days with systolic 923'R.  2. WEIGHT MANAGEMENT FOLLOW-UP: 07/13/2021: - Continue Phentermine as prescribed.   08/17/2021: Doing well on current regimen. No issues/concerns.  3. RIGHT BREAST CONCERN: Reports discovered what seemed to be a boil underneath right breast 3 days ago. Popped it and gray drainage came out. Now has a little flap of skin there with some redness. No breast pain. Mammogram earlier 2022.   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   Can't get food down 10/29/2020   Fatigue 10/29/2020   Hemorrhoids, internal 10/29/2020   History of colon polyps 10/29/2020   Infection of the upper respiratory tract 10/29/2020   Interdigital neuralgia 10/29/2020   Pulmonary embolism ([place])    Morton's neuroma    Migraine    Hypertension    Cardiomyopathy ([place])    Type 2 diabetes mellitus ([place]) 10/25/2020   Hyperlipemia 10/25/2020   Blood in urine 07/14/2020   Nephrolithiasis 10/10/2018   Benign essential hypertension 06/01/2018   Family history of colon cancer  requiring screening colonoscopy 02/09/2018   Family history of colonic polyps 02/09/2018   Hyperglycemia 11/09/2017   Acute infection of nasal sinus 12/10/2015   Abdominal pain, right upper quadrant 12/10/2015   Prediabetes 12/10/2015   Chill 12/10/2015   FOM (frequency of micturition) 12/10/2015   Dyspnea 07/25/2015   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   GERD (gastroesophageal reflux disease) 07/25/2015   Obesity 07/25/2015   Chest wall pain 07/25/2015   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 90 tablet 0   metFORMIN (GLUCOPHAGE) 500 MG tablet Take 1 tablet (500 mg total) by mouth at bedtime. 90 tablet 0   omeprazole (PRILOSEC) 20 MG capsule TAKE 1 CAPSULE BY MOUTH EVERY DAY 90 capsule 1   ondansetron (ZOFRAN ODT) 4 MG disintegrating tablet Take 1 tablet (4 mg total) by mouth every 8 (eight) hours as needed for nausea or vomiting. 20 tablet 0   oxybutynin (DITROPAN-XL) 10 MG 24 hr tablet Take 1 tablet (10 mg total) by mouth daily. 120 tablet 0   triamcinolone ointment (KENALOG) 0.1 %  Apply 1 application topically 2 (two) times daily as needed (itching). 80 g 1   TRUEplus Lancets 28G MISC Use as directed 100 each 4   No current facility-administered medications on file prior to visit.    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   Tobacco Use   Smoking status: Former    Packs/day: 0.20    Years: 2.00    Pack years: 0.40    Types: Cigarettes    Quit date: 07/24/1986    Years since quitting: 35.0   Smokeless tobacco: Never   Tobacco comments:    social smoker  Substance and Sexual Activity   Alcohol use: Yes    Alcohol/week: 0.0 standard drinks    Comment: 1 drink on New Year's Eve   Drug use: No   Sexual activity: Not Currently    Birth control/protection: Surgical  Other Topics Concern   Not on file  Social History Narrative   Patient lives at home with her family.   Caffeine Use: 1 cup of tea two times weekly   Social Determinants of Health   Financial Resource Strain: Not on file  Food Insecurity: Not on file  Transportation Needs: Not on file  Physical Activity: Not on file  Stress: Not on file  Social Connections: Not on file  Intimate Partner Violence: Not on file    Family History  Problem Relation Age of Onset   Breast cancer Mother    Stroke Father    Heart Problems Father        heart bypass   Alzheimer's disease Paternal Aunt    Alzheimer's disease Paternal Uncle    Heart Problems Paternal Grandmother        enlarged heart   Alzheimer's disease Paternal Aunt    Alzheimer's disease Paternal Aunt    Cancer Sister        colorectal    Past Surgical History:  Procedure Laterality Date   ABDOMINAL HYSTERECTOMY     CARDIAC SURGERY  04/2014   Catherization    CHOLECYSTECTOMY     CYSTOSCOPY/URETEROSCOPY/HOLMIUM LASER/STENT PLACEMENT Left 10/10/2018   Procedure: CYSTOSCOPY/URETEROSCOPY/HOLMIUM LASER/STENT PLACEMENT;  Surgeon: Ceasar Mons, MD;  Location: [facility];  Service: Urology;  Laterality: Left;   LITHOTRIPSY     x5   TONSILLECTOMY  1969    ROS: Review of Systems Negative except as stated above  PHYSICAL EXAM: BP 126/83 (BP Location: Left Arm, Patient Position: Sitting, Cuff Size: Large)   Pulse 100   Temp 98.5 F (36.9 C)   Resp 18   Ht '5\' 5"'  (1.651 m)   Wt 227 lb 3.2 oz (103.1 kg)   SpO2 95%   BMI 37.81 kg/m   Physical Exam HENT:     Head: Normocephalic and atraumatic.  Eyes:     Extraocular Movements: Extraocular movements intact.     Conjunctiva/sclera: Conjunctivae normal.     Pupils: Pupils are equal, round, and reactive to light.  Cardiovascular:     Rate and Rhythm: Normal rate and regular rhythm.     Pulses: Normal pulses.     Heart sounds: Normal heart sounds.  Pulmonary:     Effort: Pulmonary effort is normal.     Breath sounds: Normal breath sounds.  Chest:     Comments: Patient declined exam.  Musculoskeletal:     Cervical back: Normal range of motion and neck supple.  Neurological:     General: No focal deficit present.     Mental Status: She is alert and oriented to person, place, and time.  Psychiatric:        Mood and Affect: Mood normal.        Behavior: Behavior normal.    ASSESSMENT AND PLAN: 1. Essential hypertension - Continue Hydrochlorothiazide as prescribed. No refills needed as of present.  - Counseled on blood pressure goal of less than 130/80, low-sodium, DASH diet, medication compliance, 150 minutes of moderate intensity exercise per week as tolerated. Discussed medication compliance, adverse effects. - Follow-up with primary provider in 3 months or sooner if needed.   2. Encounter for weight management: - Continue Phentermine as prescribed. Counseled medication holiday of at least 3 months will begin once current prescription completed. Patient verbalized understanding. - Follow-up with primary provider as scheduled.   - phentermine 37.5 MG capsule; Take 1 capsule (37.5 mg total)  by mouth every morning.  Dispense: 30 capsule; Refill: 0  3. Boil, breast: - Patient's history sounds likely related to boil which has since then resolved. Patient will watchful wait and notify provider of pertinent updates as needed.    4. Need for pneumococcal vaccination: - Administered today in office.  - Pneumococcal conjugate vaccine 20-valent   Patient was given the opportunity to ask questions.  Patient verbalized understanding of the plan and was able to repeat key elements of the plan. Patient was given clear instructions to go to Emergency Department or return to medical center if symptoms don't improve, worsen, or new problems develop.The patient verbalized understanding.  Requested Prescriptions   Signed Prescriptions Disp Refills   phentermine 37.5 MG capsule 30 capsule 0    Sig: Take 1 capsule (37.5 mg total) by mouth every morning.    Return in about 3 months (around 11/17/2021) for Follow-Up or next available hypertension.  Camillia Herter, NP

## 2021-08-17 ENCOUNTER — Other Ambulatory Visit: Payer: Self-pay

## 2021-08-17 ENCOUNTER — Ambulatory Visit: Payer: Managed Care, Other (non HMO) | Admitting: Family

## 2021-08-17 ENCOUNTER — Encounter: Payer: Self-pay | Admitting: Family

## 2021-08-17 VITALS — BP 126/83 | HR 100 | Temp 98.5°F | Resp 18 | Ht 65.0 in | Wt 227.2 lb

## 2021-08-17 DIAGNOSIS — N611 Abscess of the breast and nipple: Secondary | ICD-10-CM

## 2021-08-17 DIAGNOSIS — Z7689 Persons encountering health services in other specified circumstances: Secondary | ICD-10-CM

## 2021-08-17 DIAGNOSIS — I10 Essential (primary) hypertension: Secondary | ICD-10-CM | POA: Diagnosis not present

## 2021-08-17 DIAGNOSIS — Z23 Encounter for immunization: Secondary | ICD-10-CM

## 2021-08-17 MED ORDER — PHENTERMINE HCL 37.5 MG PO CAPS: 37.5000 mg | ORAL_CAPSULE | ORAL | 0 refills | Status: DC

## 2021-08-17 NOTE — Progress Notes (Signed)
Pt presents for hypertension and weight management follow-up

## 2021-08-17 NOTE — Patient Instructions (Signed)
Pneumococcal Vaccine, Polyvalent solution for injection What is this medication? PNEUMOCOCCAL VACCINE, POLYVALENT (NEU mo KOK al  vak SEEN,  pol ee VEY luhnt) is a vaccine to prevent pneumococcus bacteria infection. These bacteria are a major cause of ear infections, Strep throat infections, and serious pneumonia, meningitis, or blood infections worldwide. These vaccines help the body to produce antibodies (protective substances) that help your body defend against these bacteria. This vaccine is recommended for people 46 years of age and older with health problems. It is also recommended for all adults over 30 years old. This vaccine will not treat an infection. This medicine may be used for other purposes; ask your health care provider or pharmacist if you have questions. COMMON BRAND NAME(S): Pneumovax 23 What should I tell my care team before I take this medication? They need to know if you have any of these conditions: bleeding problems bone marrow or organ transplant cancer, Hodgkin's disease fever infection immune system problems low platelet count in the blood seizures an unusual or allergic reaction to pneumococcal vaccine, diphtheria toxoid, other vaccines, latex, other medicines, foods, dyes, or preservatives pregnant or trying to get pregnant breast-feeding How should I use this medication? This vaccine is for injection into a muscle or under the skin. It is given by a health care professional. A copy of Vaccine Information Statements will be given before each vaccination. Read this sheet carefully each time. The sheet may change frequently. Talk to your pediatrician regarding the use of this medicine in children. While this drug may be prescribed for children as young as 79 years of age for selected conditions, precautions do apply. Overdosage: If you think you have taken too much of this medicine contact a poison control center or emergency room at once. NOTE: This medicine is only  for you. Do not share this medicine with others. What if I miss a dose? It is important not to miss your dose. Call your doctor or health care professional if you are unable to keep an appointment. What may interact with this medication? medicines for cancer chemotherapy medicines that suppress your immune function medicines that treat or prevent blood clots like warfarin, enoxaparin, and dalteparin steroid medicines like prednisone or cortisone This list may not describe all possible interactions. Give your health care provider a list of all the medicines, herbs, non-prescription drugs, or dietary supplements you use. Also tell them if you smoke, drink alcohol, or use illegal drugs. Some items may interact with your medicine. What should I watch for while using this medication? Mild fever and pain should go away in 3 days or less. Report any unusual symptoms to your doctor or health care professional. What side effects may I notice from receiving this medication? Side effects that you should report to your doctor or health care professional as soon as possible: allergic reactions like skin rash, itching or hives, swelling of the face, lips, or tongue breathing problems confused fever over 102 degrees F pain, tingling, numbness in the hands or feet seizures unusual bleeding or bruising unusual muscle weakness Side effects that usually do not require medical attention (report to your doctor or health care professional if they continue or are bothersome): aches and pains diarrhea fever of 102 degrees F or less headache irritable loss of appetite pain, tender at site where injected trouble sleeping This list may not describe all possible side effects. Call your doctor for medical advice about side effects. You may report side effects to FDA at 1-800-FDA-1088. Where  should I keep my medication? This does not apply. This vaccine is given in a clinic, pharmacy, doctor's office, or other  health care setting and will not be stored at home. NOTE: This sheet is a summary. It may not cover all possible information. If you have questions about this medicine, talk to your doctor, pharmacist, or health care provider.  2022 Elsevier/Gold Standard (2008-05-19 00:00:00)

## 2021-08-23 ENCOUNTER — Encounter: Payer: Self-pay | Admitting: Family

## 2021-08-24 ENCOUNTER — Ambulatory Visit: Payer: Managed Care, Other (non HMO) | Admitting: Nurse Practitioner

## 2021-08-24 ENCOUNTER — Encounter: Payer: Self-pay | Admitting: Family

## 2021-08-24 ENCOUNTER — Other Ambulatory Visit: Payer: Self-pay

## 2021-08-24 VITALS — BP 120/82 | HR 92 | Resp 16

## 2021-08-24 DIAGNOSIS — N6314 Unspecified lump in the right breast, lower inner quadrant: Secondary | ICD-10-CM

## 2021-08-24 MED ORDER — MUPIROCIN CALCIUM 2 % EX CREA: 1.0000 "application " | TOPICAL_CREAM | Freq: Two times a day (BID) | CUTANEOUS | 0 refills | Status: DC

## 2021-08-24 MED ORDER — CEPHALEXIN 500 MG PO CAPS: 500.0000 mg | ORAL_CAPSULE | Freq: Three times a day (TID) | ORAL | 0 refills | Status: AC

## 2021-08-24 NOTE — Telephone Encounter (Signed)
Patient given appt 

## 2021-08-24 NOTE — Patient Instructions (Addendum)
Right Breast abscess:  Clean area with dial antibacterial soap, rinse well, pat dry  Will order Bactroban ointment  Will order keflex  May use warm wet compresses 3 times daily  Will order [place]: GI imaging: Address: [address], [place], [place] [address] Phone: [phone]  Follow up:  Follow up in 2 weeks with Amy

## 2021-08-24 NOTE — Progress Notes (Signed)
'@Patient'  ID: Ashley Mcconnell, female    DOB: 1961/06/30, 60 y.o.   MRN: [id number]  Chief Complaint  Patient presents with   Breast Problem    Referring provider: Camillia Herter, NP  HPI  Patient presents today for breast abscess.  Patient states that she noticed a tender area to her right inner breast on or around 08/17/2021.  She states that the area became red and hot to the touch.  She states that as of yesterday the area drained a large amount of green drainage.  She states that her breast is very tender. Denies f/c/s, n/v/d, hemoptysis, PND, chest pain or edema.        Allergies  Allergen Reactions   Contrast Media [Iodinated Diagnostic Agents]     Rash    Fish Allergy Rash    [name] is only fish she does not have allergy to   Shellfish Allergy Itching and Rash    Immunization History  Administered Date(s) Administered   Influenza Split 07/28/2011, 08/22/2012   Influenza,inj,Quad PF,6+ Mos 07/13/2021   Influenza-Unspecified 07/18/2015, 08/07/2019, 07/15/2020   Moderna Sars-Covid-2 Vaccination 11/09/2019, 12/07/2019, 08/12/2020   PNEUMOCOCCAL CONJUGATE-20 08/17/2021   Tdap 07/28/2011   Zoster Recombinat (Shingrix) 02/06/2018, 09/22/2019   Zoster, Live 09/21/2019    Past Medical History:  Diagnosis Date   Cardiomyopathy ([place])    Hypertension    Phreesia 09/26/2020   Kidney stones    Migraine    Morton's neuroma    L Foot   Pulmonary embolism ([facility])     Tobacco History: Social History   Tobacco Use  Smoking Status Former   Packs/day: 0.20   Years: 2.00   Pack years: 0.40   Types: Cigarettes   Quit date: 07/24/1986   Years since quitting: 35.1  Smokeless Tobacco Never  Tobacco Comments   social smoker   Counseling given: Yes Tobacco comments: social smoker   Outpatient Encounter Medications as of 08/24/2021  Medication Sig   cephALEXin (KEFLEX) 500 MG capsule Take 1 capsule (500 mg total) by mouth 3 (three) times daily for 10 days.    [DISCONTINUED] mupirocin cream (BACTROBAN) 2 % Apply 1 application topically 2 (two) times daily.   atorvastatin (LIPITOR) 20 MG tablet TAKE 1 TABLET BY MOUTH EVERY DAY   Blood Glucose Monitoring Suppl (TRUE METRIX METER) w/Device KIT Use as directed   glucose blood (TRUE METRIX BLOOD GLUCOSE TEST) test strip Use as instructed   hydrochlorothiazide (HYDRODIURIL) 12.5 MG tablet Take 1 tablet (12.5 mg total) by mouth daily.   metFORMIN (GLUCOPHAGE) 500 MG tablet Take 1 tablet (500 mg total) by mouth at bedtime.   omeprazole (PRILOSEC) 20 MG capsule TAKE 1 CAPSULE BY MOUTH EVERY DAY   ondansetron (ZOFRAN ODT) 4 MG disintegrating tablet Take 1 tablet (4 mg total) by mouth every 8 (eight) hours as needed for nausea or vomiting.   oxybutynin (DITROPAN-XL) 10 MG 24 hr tablet Take 1 tablet (10 mg total) by mouth daily.   phentermine 37.5 MG capsule Take 1 capsule (37.5 mg total) by mouth every morning.   triamcinolone ointment (KENALOG) 0.1 % Apply 1 application topically 2 (two) times daily as needed (itching).   TRUEplus Lancets 28G MISC Use as directed   No facility-administered encounter medications on file as of 08/24/2021.     Review of Systems  Review of Systems  Constitutional: Negative.   HENT: Negative.    Cardiovascular: Negative.   Gastrointestinal: Negative.   Skin:        Abscess  to right inner breast  Allergic/Immunologic: Negative.   Neurological: Negative.   Psychiatric/Behavioral: Negative.        Physical Exam  BP 120/82   Pulse 92   Resp 16   SpO2 98%   Wt Readings from Last 5 Encounters:  08/17/21 227 lb 3.2 oz (103.1 kg)  07/13/21 229 lb 3.2 oz (104 kg)  04/27/21 233 lb (105.7 kg)  01/27/21 237 lb 1.3 oz (107.5 kg)  01/26/21 231 lb 6.4 oz (105 kg)     Physical Exam Vitals and nursing note reviewed.  Constitutional:      General: She is not in acute distress.    Appearance: She is well-developed.  Cardiovascular:     Rate and Rhythm: Normal rate and  regular rhythm.  Pulmonary:     Effort: Pulmonary effort is normal.     Breath sounds: Normal breath sounds.  Chest:  Breasts:    Right: Mass, skin change and tenderness (tender to area noted) present.       Comments: Red area draining scant amount of serosanguineous fluid noted. Warm and tender to the touch. Mass palpated near this region.  Neurological:     Mental Status: She is alert and oriented to person, place, and time.     Lab Results:  CBC    Component Value Date/Time   WBC 8.5 10/24/2020 1616   WBC 6.0 10/10/2018 0630   RBC 4.98 10/24/2020 1616   RBC 4.08 10/10/2018 0630   HGB 14.1 10/24/2020 1616   HCT 41.5 10/24/2020 1616   PLT 308 10/24/2020 1616   MCV 83 10/24/2020 1616   MCH 28.3 10/24/2020 1616   MCH 27.9 10/10/2018 0630   MCHC 34.0 10/24/2020 1616   MCHC 31.3 10/10/2018 0630   RDW 12.7 10/24/2020 1616   LYMPHSABS 1.9 10/09/2018 1916   MONOABS 0.5 10/09/2018 1916   EOSABS 0.2 10/09/2018 1916   BASOSABS 0.0 10/09/2018 1916    BMET    Component Value Date/Time   NA 145 (H) 07/13/2021 0900   K 3.9 07/13/2021 0900   CL 102 07/13/2021 0900   CO2 24 07/13/2021 0900   GLUCOSE 148 (H) 07/13/2021 0900   GLUCOSE 141 (H) 10/10/2018 0630   BUN 17 07/13/2021 0900   CREATININE 0.81 07/13/2021 0900   CALCIUM 9.6 07/13/2021 0900   GFRNONAA 86 10/24/2020 1616   GFRAA 99 10/24/2020 1616    BNP No results found for: BNP  ProBNP No results found for: PROBNP  Imaging: No results found.   Assessment & Plan:   Lump in lower inner quadrant of right breast Clean area with dial antibacterial soap, rinse well, pat dry  Will order Bactroban ointment  Will order keflex  May use warm wet compresses 3 times daily  Will order [place]: GI imaging: Address: [address], [place], [place] [address] Phone: [phone]  Follow up:  Follow up in 2 weeks with Amy     Fenton Foy, NP 08/25/2021

## 2021-08-24 NOTE — Telephone Encounter (Signed)
I just need the [place]. I will call them in the morning.

## 2021-08-25 ENCOUNTER — Encounter: Payer: Self-pay | Admitting: Nurse Practitioner

## 2021-08-25 ENCOUNTER — Other Ambulatory Visit: Payer: Self-pay | Admitting: Family

## 2021-08-25 ENCOUNTER — Other Ambulatory Visit: Payer: Self-pay | Admitting: Nurse Practitioner

## 2021-08-25 DIAGNOSIS — N611 Abscess of the breast and nipple: Secondary | ICD-10-CM

## 2021-08-25 DIAGNOSIS — N6314 Unspecified lump in the right breast, lower inner quadrant: Secondary | ICD-10-CM

## 2021-08-25 DIAGNOSIS — N3281 Overactive bladder: Secondary | ICD-10-CM

## 2021-08-25 DIAGNOSIS — N3289 Other specified disorders of bladder: Secondary | ICD-10-CM

## 2021-08-25 MED ORDER — MUPIROCIN 2 % EX OINT: 1.0000 "application " | TOPICAL_OINTMENT | Freq: Two times a day (BID) | CUTANEOUS | 0 refills | Status: AC

## 2021-08-25 NOTE — Assessment & Plan Note (Signed)
Clean area with dial antibacterial soap, rinse well, pat dry  Will order Bactroban ointment  Will order keflex  May use warm wet compresses 3 times daily  Will order [place]: [facility] imaging: Address: [address], [place], [place] [address] Phone: [phone]  Follow up:  Follow up in 2 weeks with Amy

## 2021-08-25 NOTE — Telephone Encounter (Signed)
I spoke with the imaging center and they will schedule her appointment today. Thanks.

## 2021-08-25 NOTE — Telephone Encounter (Signed)
Discussed issues with patient. Advised to start antihistamine and stop the antibiotic if rash spreads.

## 2021-08-26 ENCOUNTER — Other Ambulatory Visit: Payer: Self-pay | Admitting: Nurse Practitioner

## 2021-08-26 DIAGNOSIS — N611 Abscess of the breast and nipple: Secondary | ICD-10-CM

## 2021-08-28 MED ORDER — OXYBUTYNIN CHLORIDE ER 10 MG PO TB24: 10.0000 mg | ORAL_TABLET | Freq: Every day | ORAL | 0 refills | Status: DC

## 2021-09-02 ENCOUNTER — Ambulatory Visit: Payer: Managed Care, Other (non HMO)

## 2021-09-02 ENCOUNTER — Ambulatory Visit
Admission: RE | Admit: 2021-09-02 | Discharge: 2021-09-02 | Disposition: A | Discharge status: Home / Self Care | Payer: Managed Care, Other (non HMO) | Source: Ambulatory Visit | Attending: Nurse Practitioner | Admitting: Nurse Practitioner

## 2021-09-02 ENCOUNTER — Other Ambulatory Visit: Payer: Managed Care, Other (non HMO)

## 2021-09-02 DIAGNOSIS — N6314 Unspecified lump in the right breast, lower inner quadrant: Secondary | ICD-10-CM

## 2021-09-02 DIAGNOSIS — N611 Abscess of the breast and nipple: Secondary | ICD-10-CM

## 2021-09-07 ENCOUNTER — Other Ambulatory Visit: Payer: Self-pay | Admitting: Family

## 2021-09-07 ENCOUNTER — Encounter: Payer: Self-pay | Admitting: Family

## 2021-09-07 DIAGNOSIS — E119 Type 2 diabetes mellitus without complications: Secondary | ICD-10-CM

## 2021-09-08 ENCOUNTER — Other Ambulatory Visit: Payer: Self-pay | Admitting: Family

## 2021-09-08 DIAGNOSIS — N611 Abscess of the breast and nipple: Secondary | ICD-10-CM

## 2021-09-08 MED ORDER — SULFAMETHOXAZOLE-TRIMETHOPRIM 800-160 MG PO TABS: 1.0000 | ORAL_TABLET | Freq: Two times a day (BID) | ORAL | 0 refills | Status: AC

## 2021-09-10 MED ORDER — METFORMIN HCL 500 MG PO TABS: 500.0000 mg | ORAL_TABLET | Freq: Every day | ORAL | 0 refills | Status: DC

## 2021-09-12 NOTE — Progress Notes (Signed)
Patient ID: Ashley Mcconnell, female    DOB: [birth year]  MRN: [id number]  CC: Breast Abscess Follow-Up   Subjective: Ashley Mcconnell is a 60 y.o. female who presents for breast abscess follow-up.   Her concerns today include:  BREAST ABSCESS FOLLOW-UP: 08/24/2021 with Lazaro Arms, NP: Clean area with dial antibacterial soap, rinse well, pat dry Will order Bactroban ointment Will order keflex May use warm wet compresses 3 times daily Will order [place]: GI imaging: Address: [address], [place], [place] [address] Phone: [phone]  09/02/2021 right breast ultrasound results per MD note: Complete current course of antibiotics and continue with warm compresses twice per day. Return to routine annual screening mammography which will be due in April 2023.   09/07/2021: A new prescription for Sulfamethoxazole-Trimethoprim (Bactrim DS) for right breast abscess sent to your preferred pharmacy. In regards to elevated blood pressure and blood sugar concerns this may be related to current breast abscess. Please call our office and schedule appointment to discuss in more detail.   09/15/2021: Patient reports there is still a small open flap with minimal draining on one spot of the breast. Reports the other spot on the breast has resolved. Mentioned a spot on her stomach has resolved as well. Still taking antibiotics as prescribed.  2. LEFT LEG SWELLING: Reports this morning noticed a lump on left lower leg. Since then has resolved but still having some swelling. 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 Denies injury/trauma, pain, tenderness, warmth, radiation, chest pain, shortness of breath, and additional red flag symptoms.    Patient Active Problem List   Diagnosis Date Noted   Lump in lower inner quadrant of right breast 08/25/2021   COVID 05/30/2021   Bacterial vaginitis 04/28/2021   Obesity (BMI 30-39.9) 01/27/2021   Chest pain of  uncertain etiology 11/94/1740   Diabetes mellitus due to underlying condition with hyperosmolarity without coma, without long-term current use of insulin (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   Can't get food down 10/29/2020   Fatigue 10/29/2020   Hemorrhoids, internal 10/29/2020   History of colon polyps 10/29/2020   Infection of the upper respiratory tract 10/29/2020   Interdigital neuralgia 10/29/2020   Pulmonary embolism ([place])    Morton's neuroma    Migraine    Hypertension    Cardiomyopathy ([place])    Type 2 diabetes mellitus ([place]) 10/25/2020   Hyperlipemia 10/25/2020   Blood in urine 07/14/2020   Nephrolithiasis 10/10/2018   Benign essential hypertension 06/01/2018   Family history of colon cancer requiring screening colonoscopy 02/09/2018   Family history of colonic polyps 02/09/2018   Hyperglycemia 11/09/2017   Acute infection of nasal sinus 12/10/2015   Abdominal pain, right upper quadrant 12/10/2015   Prediabetes 12/10/2015   Chill 12/10/2015   FOM (frequency of micturition) 12/10/2015   Dyspnea 07/25/2015   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   GERD (gastroesophageal reflux disease) 07/25/2015   Obesity 07/25/2015   Chest wall pain 07/25/2015   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   Calculus of kidney 02/06/2013   Left flank pain 02/06/2013   Leukocytosis 02/06/2013     Current Outpatient Medications on File Prior to Visit  Medication Sig Dispense Refill   atorvastatin (LIPITOR) 20 MG tablet TAKE 1 TABLET BY MOUTH EVERY  DAY 90 tablet 1   Blood Glucose Monitoring Suppl (TRUE METRIX METER) w/Device KIT Use as directed 1 kit 0   cyclobenzaprine (FLEXERIL) 5 MG tablet Take 5 mg by mouth at bedtime.     diclofenac (CATAFLAM) 50 MG tablet Take 50 mg by mouth 3 (three) times daily as needed.      glucose blood (TRUE METRIX BLOOD GLUCOSE TEST) test strip Use as instructed 100 each 12   hydrochlorothiazide (HYDRODIURIL) 12.5 MG tablet Take 1 tablet (12.5 mg total) by mouth daily. 90 tablet 0   metFORMIN (GLUCOPHAGE) 500 MG tablet Take 1 tablet (500 mg total) by mouth at bedtime. 90 tablet 0   mupirocin ointment (BACTROBAN) 2 % Apply 1 application topically 2 (two) times daily. 22 g 0   omeprazole (PRILOSEC) 20 MG capsule TAKE 1 CAPSULE BY MOUTH EVERY DAY 90 capsule 1   ondansetron (ZOFRAN ODT) 4 MG disintegrating tablet Take 1 tablet (4 mg total) by mouth every 8 (eight) hours as needed for nausea or vomiting. 20 tablet 0   oxybutynin (DITROPAN-XL) 10 MG 24 hr tablet Take 1 tablet (10 mg total) by mouth daily. 120 tablet 0   phentermine 37.5 MG capsule Take 1 capsule (37.5 mg total) by mouth every morning. 30 capsule 0   sulfamethoxazole-trimethoprim (BACTRIM DS) 800-160 MG tablet Take 1 tablet by mouth 2 (two) times daily for 10 days. 20 tablet 0   triamcinolone ointment (KENALOG) 0.1 % Apply 1 application topically 2 (two) times daily as needed (itching). 80 g 1   TRUEplus Lancets 28G MISC Use as directed 100 each 4   No current facility-administered medications on file prior to visit.    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   Tobacco Use   Smoking status: Former    Packs/day: 0.20    Years: 2.00    Pack years: 0.40    Types: Cigarettes    Quit date: 07/24/1986    Years since quitting: 35.1   Smokeless tobacco: Never   Tobacco comments:    social  smoker  Substance and Sexual Activity   Alcohol use: Yes    Alcohol/week: 0.0 standard drinks    Comment: 1 drink on New Year's Eve   Drug use: No   Sexual activity: Not Currently    Birth control/protection: Surgical  Other Topics Concern   Not on file  Social History Narrative   Patient lives at home with her family.   Caffeine Use: 1 cup of tea two times weekly   Social Determinants of Health   Financial Resource Strain: Not on file  Food Insecurity: Not on file  Transportation Needs: Not on file  Physical Activity: Not on file  Stress: Not on file  Social Connections: Not on file  Intimate Partner Violence: Not on file    Family History  Problem Relation Age of Onset   Breast cancer Mother    Stroke Father    Heart Problems Father  heart bypass   Alzheimer's disease Paternal Aunt    Alzheimer's disease Paternal Uncle    Heart Problems Paternal Grandmother        enlarged heart   Alzheimer's disease Paternal Aunt    Alzheimer's disease Paternal Aunt    Cancer Sister        colorectal    Past Surgical History:  Procedure Laterality Date   ABDOMINAL HYSTERECTOMY     CARDIAC SURGERY  04/2014   Catherization    CHOLECYSTECTOMY     CYSTOSCOPY/URETEROSCOPY/HOLMIUM LASER/STENT PLACEMENT Left 10/10/2018   Procedure: CYSTOSCOPY/URETEROSCOPY/HOLMIUM LASER/STENT PLACEMENT;  Surgeon: Ceasar Mons, MD;  Location: [facility];  Service: Urology;  Laterality: Left;   LITHOTRIPSY     x5   TONSILLECTOMY  1969    ROS: Review of Systems Negative except as stated above  PHYSICAL EXAM: BP 139/84 (BP Location: Left Arm, Patient Position: Sitting, Cuff Size: Large)   Pulse 99   Temp 98.1 F (36.7 C)   Resp 18   Ht _0  (1.651 m)   Wt 233 lb (105.7 kg)   SpO2 97%   BMI 38.77 kg/m   Physical Exam HENT:     Head: Normocephalic and atraumatic.  Eyes:     Extraocular Movements: Extraocular movements intact.     Conjunctiva/sclera: Conjunctivae normal.      Pupils: Pupils are equal, round, and reactive to light.  Cardiovascular:     Rate and Rhythm: Normal rate and regular rhythm.     Pulses: Normal pulses.     Heart sounds: Normal heart sounds.  Pulmonary:     Effort: Pulmonary effort is normal.     Breath sounds: Normal breath sounds.  Musculoskeletal:     Cervical back: Normal range of motion and neck supple.     Left lower leg: Swelling present.     Comments: 1+ edema left lower extremity, no evidence of erythema, pain/tenderness, drainage, or compromised skin integrity.   Neurological:     General: No focal deficit present.     Mental Status: She is alert and oriented to person, place, and time.  Psychiatric:        Mood and Affect: Mood normal.        Behavior: Behavior normal.    ASSESSMENT AND PLAN: 1. Abscess of right breast: - Continue Sulfamethoxazole-Trimethoprim as prescribed.  - Ultrasound of right breast on 09/02/2021 with recommendation return to routine annual screening mammography which will be due in April 2023. - Follow-up with primary provider as scheduled.   2. Dependent edema: - No evidence of red flag symptoms.  - Counseled on the following: Keep bilateral legs raised above (elevated) above the level of your heart when you are sitting or lying down. Do not sit or stand for a long time.  Exercise your legs. This may help swelling go down.  Wear support stockings as tolerated. - Follow-up with primary provider as scheduled.  3. Essential hypertension: - Continue Hydrochlorothiazide as prescribed.  - Follow-up with primary provider February 2023 or sooner if needed.   4. Type 2 diabetes mellitus without complication, without long-term current use of insulin ([place]): - Continue Metformin as prescribed. - Follow-up with primary provider January 2023 or sooner if needed.    Patient was given the opportunity to ask questions.  Patient verbalized understanding of the plan and was able to repeat key  elements of the plan. Patient was given clear instructions to go to Emergency Department or return to medical center if symptoms  don't improve, worsen, or new problems develop.The patient verbalized understanding.  Follow-up with primary provider as scheduled.   Camillia Herter, NP

## 2021-09-15 ENCOUNTER — Encounter: Payer: Self-pay | Admitting: Family

## 2021-09-15 ENCOUNTER — Ambulatory Visit: Payer: Managed Care, Other (non HMO) | Admitting: Family

## 2021-09-15 ENCOUNTER — Other Ambulatory Visit: Payer: Self-pay

## 2021-09-15 VITALS — BP 139/84 | HR 99 | Temp 98.1°F | Resp 18 | Ht 65.0 in | Wt 233.0 lb

## 2021-09-15 DIAGNOSIS — I10 Essential (primary) hypertension: Secondary | ICD-10-CM

## 2021-09-15 DIAGNOSIS — E119 Type 2 diabetes mellitus without complications: Secondary | ICD-10-CM

## 2021-09-15 DIAGNOSIS — R609 Edema, unspecified: Secondary | ICD-10-CM

## 2021-09-15 DIAGNOSIS — N611 Abscess of the breast and nipple: Secondary | ICD-10-CM

## 2021-09-15 NOTE — Progress Notes (Signed)
Pt presents for follow-up right breast abscess, pt stated the area is healing fine

## 2021-09-18 ENCOUNTER — Encounter: Payer: Self-pay | Admitting: Family

## 2021-09-22 ENCOUNTER — Other Ambulatory Visit: Payer: Self-pay | Admitting: Family

## 2021-09-22 DIAGNOSIS — I10 Essential (primary) hypertension: Secondary | ICD-10-CM

## 2021-09-23 NOTE — Telephone Encounter (Signed)
Hydrochlorothiazide (Hydrodiuril) refilled per patient request. Please schedule appointment for additional refills.

## 2021-09-25 ENCOUNTER — Other Ambulatory Visit: Payer: Self-pay | Admitting: Family

## 2021-09-25 ENCOUNTER — Encounter: Payer: Self-pay | Admitting: Family

## 2021-09-25 DIAGNOSIS — N611 Abscess of the breast and nipple: Secondary | ICD-10-CM

## 2021-09-25 MED ORDER — SULFAMETHOXAZOLE-TRIMETHOPRIM 800-160 MG PO TABS: 1.0000 | ORAL_TABLET | Freq: Two times a day (BID) | ORAL | 0 refills | Status: AC

## 2021-09-30 DIAGNOSIS — M25511 Pain in right shoulder: Secondary | ICD-10-CM | POA: Insufficient documentation

## 2021-10-01 DIAGNOSIS — M542 Cervicalgia: Secondary | ICD-10-CM | POA: Insufficient documentation

## 2021-10-04 ENCOUNTER — Encounter: Payer: Self-pay | Admitting: Family

## 2021-10-23 ENCOUNTER — Other Ambulatory Visit: Payer: Self-pay

## 2021-10-23 ENCOUNTER — Encounter: Payer: Self-pay | Admitting: Family

## 2021-10-23 DIAGNOSIS — E785 Hyperlipidemia, unspecified: Secondary | ICD-10-CM

## 2021-10-23 DIAGNOSIS — K219 Gastro-esophageal reflux disease without esophagitis: Secondary | ICD-10-CM

## 2021-10-23 MED ORDER — OMEPRAZOLE 20 MG PO CPDR: DELAYED_RELEASE_CAPSULE | ORAL | 1 refills | Status: DC

## 2021-10-23 MED ORDER — ATORVASTATIN CALCIUM 20 MG PO TABS: 20.0000 mg | ORAL_TABLET | Freq: Every day | ORAL | 1 refills | Status: DC

## 2021-11-11 NOTE — Progress Notes (Signed)
Patient ID: Ashley Mcconnell, female    DOB: October 19, 1960  MRN: [id number]  CC: Hypertension Follow-Up  Subjective: Ashley Mcconnell is a 61 y.o. female who presents for hypertension follow-up.   Her concerns today include:  HYPERTENSION FOLLOW-UP: 08/17/2021: - Continue Hydrochlorothiazide as prescribed.   11/16/2021: Doing well on current regimen. No side effects. No issues/concerns. Denies chest pain and shortness of breath.   2. DIABETES TYPE 2 FOLLOW-UP: 07/13/2021: - Continue Metformin as prescribed.   11/16/2021: Doing well on current regimen. No issues/concerns. Blood sugars 120's-130's. However, recently had spikes in blood sugars after steroid injection for shoulder pain.   3. HYPERLIPIDEMIA FOLLOW-UP: Doing well on Atorvastatin. No issues/concerns.  4. ACID REFLUX FOLLOW-UP: Doing well on Omeprazole. No issues/concerns.  5. FATIGUE: Noticed today feeling weak and lethargic. Her family and supervisor noticed the same. She was able to push through work. She is easily nodding off to sleep and has a decreased appetite. Drinking plenty of water. Heart rate down to 59 today on her smart watch when her normal resting rate is in the 80's. Reports current feeling made her think of the time she had a blood infection. Denies nausea, vomiting, chest pain, shortness of breath, fever, and additional red flag symptoms.  6. SKIN CONCERN: Had appointment with Dermatology for concerns of lesion of left shoulder and scalp concern. Prescribed Keflex which helped. Since completed antibiotic seems lesion is returning as well as areas in the scalp. No follow-up scheduled with Dermatology as there was no recommendation to do so. Reports recent prescription of Bactrim related to breast infection caused allergic-like reaction of swollen tongue and mouth itching. Benadryl helped. Currently overall back to normal.  7. WEIGHT MANAGEMENT: Would like to restart Phentermine.  Depression screen [facility] 2/9 11/16/2021  09/15/2021 08/17/2021 07/13/2021 04/27/2021  Decreased Interest 0 0 0 0 0  Down, Depressed, Hopeless 0 0 0 0 0  PHQ - 2 Score 0 0 0 0 0  Altered sleeping - - - - 0  Tired, decreased energy - - - - 0  Change in appetite - - - - 0  Feeling bad or failure about yourself  - - - - 0  Trouble concentrating - - - - 0  Moving slowly or fidgety/restless - - - - 0  Suicidal thoughts - - - - 0  PHQ-9 Score - - - - 0  Difficult doing work/chores - - - - Not difficult at all    Patient Active Problem List   Diagnosis Date Noted   Neck pain 10/01/2021   Pain in joint of right shoulder 09/30/2021   Lump in lower inner quadrant of right breast 08/25/2021   COVID 05/30/2021   Bacterial vaginitis 04/28/2021   Obesity (BMI 30-39.9) 01/27/2021   Chest pain of uncertain etiology 78/29/5621   Diabetes mellitus due to underlying condition with hyperosmolarity without coma, without long-term current use of insulin ([place]) 10/31/2020   Abdominal pain 10/29/2020   Accumulation of fluid in tissues 10/29/2020   Arthralgia of lower leg 10/29/2020   Blood in feces 10/29/2020   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   Can't get food down 10/29/2020   Fatigue 10/29/2020   Hemorrhoids, internal 10/29/2020   History of colon polyps 10/29/2020   Infection of the upper respiratory tract 10/29/2020   Interdigital neuralgia 10/29/2020   Pulmonary embolism ([facility])    Morton's neuroma    Migraine    Hypertension    Cardiomyopathy ([place])    Type 2 diabetes mellitus ([place])  10/25/2020   Hyperlipemia 10/25/2020   Blood in urine 07/14/2020   Nephrolithiasis 10/10/2018   Benign essential hypertension 06/01/2018   Family history of colon cancer requiring screening colonoscopy 02/09/2018   Family history of colonic polyps 02/09/2018   Hyperglycemia 11/09/2017   Acute infection of nasal sinus 12/10/2015   Abdominal pain, right upper quadrant 12/10/2015   Prediabetes 12/10/2015   Chill 12/10/2015   FOM (frequency of  micturition) 12/10/2015   Dyspnea 07/25/2015   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   GERD (gastroesophageal reflux disease) 07/25/2015   Obesity 07/25/2015   Chest wall pain 07/25/2015   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   Calculus of kidney 02/06/2013   Left flank pain 02/06/2013   Leukocytosis 02/06/2013     Current Outpatient Medications on File Prior to Visit  Medication Sig Dispense Refill   atorvastatin (LIPITOR) 20 MG tablet Take 1 tablet (20 mg total) by mouth daily. 90 tablet 1   Blood Glucose Monitoring Suppl (TRUE METRIX METER) w/Device KIT Use as directed 1 kit 0   cyclobenzaprine (FLEXERIL) 5 MG tablet Take 5 mg by mouth at bedtime.     diclofenac (CATAFLAM) 50 MG tablet Take 50 mg by mouth 3 (three) times daily as needed.     glucose blood (TRUE METRIX BLOOD GLUCOSE TEST) test strip Use as instructed 100 each 12   methocarbamol (ROBAXIN) 500 MG tablet Take 500 mg by mouth 3 (three) times daily.     mupirocin ointment (BACTROBAN) 2 % Apply 1 application topically 2 (two) times daily. 22 g 0   omeprazole (PRILOSEC) 20 MG capsule TAKE 1 CAPSULE BY MOUTH EVERY DAY 90 capsule 1   oxybutynin (DITROPAN-XL) 10 MG 24 hr tablet Take 1 tablet (10 mg total) by mouth daily. 120 tablet 0   phentermine 37.5 MG capsule Take 1 capsule (37.5 mg total) by mouth every morning. 30 capsule 0   triamcinolone ointment (KENALOG) 0.1 % Apply 1 application topically 2 (two) times daily as needed (itching). 80 g 1   TRUEplus Lancets 28G MISC Use as directed 100 each 4   No current facility-administered medications on file prior to visit.    Allergies  Allergen Reactions   Bactrim [Sulfamethoxazole-Trimethoprim]     Swelling tongue and itching mouth.   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   Tobacco Use   Smoking status: Former    Packs/day: 0.20    Years: 2.00    Pack years: 0.40    Types: Cigarettes    Quit date: 07/24/1986    Years since quitting: 35.3   Smokeless tobacco: Never   Tobacco comments:    social smoker  Substance and Sexual Activity   Alcohol use: Yes    Alcohol/week: 0.0 standard drinks    Comment: 1 drink on New Year's Eve   Drug use: No   Sexual activity: Not Currently    Birth control/protection: Surgical  Other Topics Concern   Not on file  Social History Narrative  Patient lives at home with her family.   Caffeine Use: 1 cup of tea two times weekly   Social Determinants of Health   Financial Resource Strain: Not on file  Food Insecurity: Not on file  Transportation Needs: Not on file  Physical Activity: Not on file  Stress: Not on file  Social Connections: Not on file  Intimate Partner Violence: Not on file    Family History  Problem Relation Age of Onset   Breast cancer Mother    Stroke Father    Heart Problems Father        heart bypass   Alzheimer's disease Paternal Aunt    Alzheimer's disease Paternal Uncle    Heart Problems Paternal Grandmother        enlarged heart   Alzheimer's disease Paternal Aunt    Alzheimer's disease Paternal Aunt    Cancer Sister        colorectal    Past Surgical History:  Procedure Laterality Date   ABDOMINAL HYSTERECTOMY     CARDIAC SURGERY  04/2014   Catherization    CHOLECYSTECTOMY     CYSTOSCOPY/URETEROSCOPY/HOLMIUM LASER/STENT PLACEMENT Left 10/10/2018   Procedure: CYSTOSCOPY/URETEROSCOPY/HOLMIUM LASER/STENT PLACEMENT;  Surgeon: Ceasar Mons, MD;  Location: [facility];  Service: Urology;  Laterality: Left;   LITHOTRIPSY     x5   TONSILLECTOMY  1969     ROS: Review of Systems Negative except as stated above  PHYSICAL EXAM: BP 124/79 (BP Location: Left Arm, Patient Position: Sitting, Cuff Size: Large)    Pulse 80    Temp 98 F (36.7 C)    Resp 18    Ht '5\' 5"'  (1.651 m)    Wt 227 lb (103 kg)    SpO2 96%    BMI 37.77 kg/m   Physical Exam HENT:     Head: Normocephalic and atraumatic.  Eyes:     Extraocular Movements: Extraocular movements intact.     Conjunctiva/sclera: Conjunctivae normal.     Pupils: Pupils are equal, round, and reactive to light.  Cardiovascular:     Rate and Rhythm: Normal rate and regular rhythm.     Pulses: Normal pulses.     Heart sounds: Normal heart sounds.  Pulmonary:     Effort: Pulmonary effort is normal.     Breath sounds: Normal breath sounds.  Musculoskeletal:     Cervical back: Normal range of motion and neck supple.  Neurological:     General: No focal deficit present.     Mental Status: She is alert and oriented to person, place, and time.  Psychiatric:        Mood and Affect: Mood normal.        Behavior: Behavior normal.   Results for orders placed or performed in visit on 11/16/21  POCT glycosylated hemoglobin (Hb A1C)  Result Value Ref Range   Hemoglobin A1C 6.9 (A) 4.0 - 5.6 %   HbA1c POC (<> result, manual entry)     HbA1c, POC (prediabetic range)     HbA1c, POC (controlled diabetic range)      ASSESSMENT AND PLAN: 1. Essential (primary) hypertension: - Continue Hydrochlorothiazide as prescribed.  - Counseled on blood pressure goal of less than 130/80, low-sodium, DASH diet, medication compliance, 150 minutes of moderate intensity exercise per week as tolerated. Discussed medication compliance, adverse effects. - Follow-up with primary provider in 3 months or sooner if needed.  - hydrochlorothiazide (HYDRODIURIL) 12.5 MG tablet; Take 1 tablet (12.5 mg total) by mouth daily.  Dispense: 90 tablet; Refill: 0  2. Type 2 diabetes mellitus without complication, without long-term  current use of insulin ([place]): - Hemoglobin A1c today at goal at 6.9%, goal < 7%. This is slightly higher than previous 6.7% on 07/13/2021. - Continue Metformin as prescribed.  - Discussed the importance of healthy eating habits, low-carbohydrate diet, low-sugar diet, regular aerobic exercise (at least 150 minutes a week as tolerated) and medication compliance to achieve or maintain control of diabetes. - Follow-up with primary provider in 3 months or sooner if needed.  - POCT glycosylated hemoglobin (Hb A1C) - metFORMIN (GLUCOPHAGE) 500 MG tablet; Take 1 tablet (500 mg total) by mouth at bedtime.  Dispense: 90 tablet; Refill: 0  3. Hyperlipidemia, unspecified hyperlipidemia type: - Continue Atorvastatin as prescribed. No refills needed as of present. - Follow-up with primary provider as scheduled.  4. Gastroesophageal reflux disease, unspecified whether esophagitis present: - Continue Omeprazole as prescribed. No refills needed as of present.  - Follow-up with primary provider as scheduled.  5. Fatigue, unspecified type: 6. Encounter for vitamin deficiency screening: - Labs to screen for possible causes of fatigue. - CMP14+EGFR - CBC - Vitamin D, 25-hydroxy  7. Rash and nonspecific skin eruption: - Cephalexin as prescribed.  - Follow-up with primary provider as scheduled. - cephALEXin (KEFLEX) 500 MG capsule; Take 1 capsule (500 mg total) by mouth 4 (four) times daily for 5 days.  Dispense: 20 capsule; Refill: 0  8. Encounter for weight management: - Counseled will resume Phentermine March 2023. Patient agreeable.   Patient was given the opportunity to ask questions.  Patient verbalized understanding of the plan and was able to repeat key elements of the plan. Patient was given clear instructions to go to Emergency Department or return to medical center if symptoms don't improve, worsen, or new problems develop.The patient verbalized understanding.   Orders Placed This Encounter   Procedures   CMP14+EGFR   CBC   Vitamin D, 25-hydroxy   POCT glycosylated hemoglobin (Hb A1C)     Requested Prescriptions   Signed Prescriptions Disp Refills   hydrochlorothiazide (HYDRODIURIL) 12.5 MG tablet 90 tablet 0    Sig: Take 1 tablet (12.5 mg total) by mouth daily.   metFORMIN (GLUCOPHAGE) 500 MG tablet 90 tablet 0    Sig: Take 1 tablet (500 mg total) by mouth at bedtime.   cephALEXin (KEFLEX) 500 MG capsule 20 capsule 0    Sig: Take 1 capsule (500 mg total) by mouth 4 (four) times daily for 5 days.    Return in about 3 months (around 02/13/2022) for Follow-Up or next available hypertension, diabetes .  Camillia Herter, NP

## 2021-11-16 ENCOUNTER — Other Ambulatory Visit: Payer: Self-pay

## 2021-11-16 ENCOUNTER — Encounter: Payer: Self-pay | Admitting: Family

## 2021-11-16 ENCOUNTER — Ambulatory Visit: Payer: Managed Care, Other (non HMO) | Admitting: Family

## 2021-11-16 VITALS — BP 124/79 | HR 80 | Temp 98.0°F | Resp 18 | Ht 65.0 in | Wt 227.0 lb

## 2021-11-16 DIAGNOSIS — Z7689 Persons encountering health services in other specified circumstances: Secondary | ICD-10-CM

## 2021-11-16 DIAGNOSIS — K219 Gastro-esophageal reflux disease without esophagitis: Secondary | ICD-10-CM

## 2021-11-16 DIAGNOSIS — R21 Rash and other nonspecific skin eruption: Secondary | ICD-10-CM

## 2021-11-16 DIAGNOSIS — E785 Hyperlipidemia, unspecified: Secondary | ICD-10-CM | POA: Diagnosis not present

## 2021-11-16 DIAGNOSIS — I10 Essential (primary) hypertension: Secondary | ICD-10-CM | POA: Diagnosis not present

## 2021-11-16 DIAGNOSIS — R5383 Other fatigue: Secondary | ICD-10-CM

## 2021-11-16 DIAGNOSIS — E119 Type 2 diabetes mellitus without complications: Secondary | ICD-10-CM

## 2021-11-16 DIAGNOSIS — Z1321 Encounter for screening for nutritional disorder: Secondary | ICD-10-CM

## 2021-11-16 LAB — POCT GLYCOSYLATED HEMOGLOBIN (HGB A1C): Hemoglobin A1C: 6.9 % — AB (ref 4.0–5.6)

## 2021-11-16 MED ORDER — METFORMIN HCL 500 MG PO TABS: 500.0000 mg | ORAL_TABLET | Freq: Every day | ORAL | 0 refills | Status: DC

## 2021-11-16 MED ORDER — CEPHALEXIN 500 MG PO CAPS: 500.0000 mg | ORAL_CAPSULE | Freq: Four times a day (QID) | ORAL | 0 refills | Status: AC

## 2021-11-16 MED ORDER — HYDROCHLOROTHIAZIDE 12.5 MG PO TABS: 12.5000 mg | ORAL_TABLET | Freq: Every day | ORAL | 0 refills | Status: DC

## 2021-11-16 NOTE — Progress Notes (Signed)
Diabetes discussed in office.

## 2021-11-16 NOTE — Progress Notes (Signed)
Pt presents for diabetes and hypertension f/u, pt has been feeling very lethargic today and weak

## 2021-11-17 ENCOUNTER — Other Ambulatory Visit: Payer: Self-pay | Admitting: Family

## 2021-11-17 DIAGNOSIS — E559 Vitamin D deficiency, unspecified: Secondary | ICD-10-CM

## 2021-11-17 LAB — CMP14+EGFR
ALT: 11 IU/L (ref 0–32)
AST: 12 IU/L (ref 0–40)
Albumin/Globulin Ratio: 1.6 (ref 1.2–2.2)
Albumin: 4.8 g/dL (ref 3.8–4.9)
Alkaline Phosphatase: 87 IU/L (ref 44–121)
BUN/Creatinine Ratio: 24 — ABNORMAL HIGH (ref 9–23)
BUN: 17 mg/dL (ref 6–24)
Bilirubin Total: 0.3 mg/dL (ref 0.0–1.2)
CO2: 25 mmol/L (ref 20–29)
Calcium: 9.9 mg/dL (ref 8.7–10.2)
Chloride: 100 mmol/L (ref 96–106)
Creatinine, Ser: 0.72 mg/dL (ref 0.57–1.00)
Globulin, Total: 3 g/dL (ref 1.5–4.5)
Glucose: 169 mg/dL — ABNORMAL HIGH (ref 70–99)
Potassium: 4.2 mmol/L (ref 3.5–5.2)
Sodium: 142 mmol/L (ref 134–144)
Total Protein: 7.8 g/dL (ref 6.0–8.5)
eGFR: 97 mL/min/{1.73_m2} (ref >59)

## 2021-11-17 LAB — CBC
Hematocrit: 41.8 % (ref 34.0–46.6)
Hemoglobin: 14.3 g/dL (ref 11.1–15.9)
MCH: 28.3 pg (ref 26.6–33.0)
MCHC: 34.2 g/dL (ref 31.5–35.7)
MCV: 83 fL (ref 79–97)
Platelets: 388 10*3/uL (ref 150–450)
RBC: 5.05 x10E6/uL (ref 3.77–5.28)
RDW: 12.7 % (ref 11.7–15.4)
WBC: 13 10*3/uL — ABNORMAL HIGH (ref 3.4–10.8)

## 2021-11-17 LAB — VITAMIN D 25 HYDROXY (VIT D DEFICIENCY, FRACTURES): Vit D, 25-Hydroxy: 26.9 ng/mL — ABNORMAL LOW (ref 30.0–100.0)

## 2021-11-17 MED ORDER — VITAMIN D (ERGOCALCIFEROL) 1.25 MG (50000 UNIT) PO CAPS: 50000.0000 [IU] | ORAL_CAPSULE | ORAL | 3 refills | Status: DC

## 2021-11-17 NOTE — Progress Notes (Signed)
Kidney function normal.   Liver function normal.   No anemia.   White blood cells (sometimes called infection fighters) higher than normal. Repeat lab in 4 to 6 weeks.   Vitamin D level lower than normal.A vitamin D prescription has been sent to your preferred pharmacy. Encouraged to recheck vitamin D levels in 12 to 16 weeks.

## 2021-12-04 ENCOUNTER — Other Ambulatory Visit: Payer: Self-pay

## 2021-12-04 ENCOUNTER — Other Ambulatory Visit: Payer: Self-pay | Admitting: Family

## 2021-12-04 DIAGNOSIS — N3281 Overactive bladder: Secondary | ICD-10-CM

## 2021-12-04 DIAGNOSIS — N3289 Other specified disorders of bladder: Secondary | ICD-10-CM

## 2021-12-04 MED ORDER — OXYBUTYNIN CHLORIDE ER 10 MG PO TB24: 10.0000 mg | ORAL_TABLET | Freq: Every day | ORAL | 0 refills | Status: DC

## 2021-12-14 ENCOUNTER — Encounter: Payer: Self-pay | Admitting: Family

## 2021-12-17 ENCOUNTER — Other Ambulatory Visit: Payer: Self-pay | Admitting: Family

## 2021-12-17 DIAGNOSIS — Z7689 Persons encountering health services in other specified circumstances: Secondary | ICD-10-CM

## 2021-12-17 MED ORDER — PHENTERMINE HCL 15 MG PO CAPS: 15.0000 mg | ORAL_CAPSULE | ORAL | 0 refills | Status: DC

## 2022-01-26 ENCOUNTER — Other Ambulatory Visit: Payer: Self-pay | Admitting: Family

## 2022-01-26 NOTE — Telephone Encounter (Signed)
Schedule appointment for weight check.

## 2022-01-28 ENCOUNTER — Encounter: Payer: Self-pay | Admitting: Family

## 2022-01-28 NOTE — Telephone Encounter (Signed)
Call patient with update.  ? ?We will need to begin at the 15 mg dose and titrate up every 4 weeks.

## 2022-02-04 ENCOUNTER — Emergency Department (HOSPITAL_COMMUNITY)
Admission: EM | Admit: 2022-02-04 | Discharge: 2022-02-04 | Disposition: A | Discharge status: Home / Self Care | Payer: Managed Care, Other (non HMO) | Attending: Emergency Medicine | Admitting: Emergency Medicine

## 2022-02-04 ENCOUNTER — Other Ambulatory Visit: Payer: Self-pay

## 2022-02-04 ENCOUNTER — Ambulatory Visit: Payer: Self-pay | Admitting: *Deleted

## 2022-02-04 ENCOUNTER — Encounter (HOSPITAL_COMMUNITY): Payer: Self-pay | Admitting: Emergency Medicine

## 2022-02-04 DIAGNOSIS — Z7984 Long term (current) use of oral hypoglycemic drugs: Secondary | ICD-10-CM | POA: Diagnosis not present

## 2022-02-04 DIAGNOSIS — Z87442 Personal history of urinary calculi: Secondary | ICD-10-CM | POA: Diagnosis not present

## 2022-02-04 DIAGNOSIS — R1011 Right upper quadrant pain: Secondary | ICD-10-CM | POA: Diagnosis present

## 2022-02-04 DIAGNOSIS — R110 Nausea: Secondary | ICD-10-CM | POA: Insufficient documentation

## 2022-02-04 DIAGNOSIS — I10 Essential (primary) hypertension: Secondary | ICD-10-CM | POA: Diagnosis not present

## 2022-02-04 DIAGNOSIS — N390 Urinary tract infection, site not specified: Secondary | ICD-10-CM | POA: Insufficient documentation

## 2022-02-04 DIAGNOSIS — R42 Dizziness and giddiness: Secondary | ICD-10-CM | POA: Insufficient documentation

## 2022-02-04 DIAGNOSIS — Z79899 Other long term (current) drug therapy: Secondary | ICD-10-CM | POA: Insufficient documentation

## 2022-02-04 DIAGNOSIS — E119 Type 2 diabetes mellitus without complications: Secondary | ICD-10-CM | POA: Diagnosis not present

## 2022-02-04 LAB — URINALYSIS, ROUTINE W REFLEX MICROSCOPIC
Bilirubin Urine: NEGATIVE
Glucose, UA: NEGATIVE mg/dL
Hgb urine dipstick: NEGATIVE
Ketones, ur: NEGATIVE mg/dL
Nitrite: NEGATIVE
Protein, ur: NEGATIVE mg/dL
Specific Gravity, Urine: 1.005 (ref 1.005–1.030)
pH: 7 (ref 5.0–8.0)

## 2022-02-04 LAB — CBC WITH DIFFERENTIAL/PLATELET
Abs Immature Granulocytes: 0.04 10*3/uL (ref 0.00–0.07)
Basophils Absolute: 0 10*3/uL (ref 0.0–0.1)
Basophils Relative: 0 %
Eosinophils Absolute: 0.1 10*3/uL (ref 0.0–0.5)
Eosinophils Relative: 2 %
HCT: 40.2 % (ref 36.0–46.0)
Hemoglobin: 13.7 g/dL (ref 12.0–15.0)
Immature Granulocytes: 0 %
Lymphocytes Relative: 24 %
Lymphs Abs: 2.2 10*3/uL (ref 0.7–4.0)
MCH: 28.5 pg (ref 26.0–34.0)
MCHC: 34.1 g/dL (ref 30.0–36.0)
MCV: 83.8 fL (ref 80.0–100.0)
Monocytes Absolute: 0.6 10*3/uL (ref 0.1–1.0)
Monocytes Relative: 6 %
Neutro Abs: 6.2 10*3/uL (ref 1.7–7.7)
Neutrophils Relative %: 68 %
Platelets: 299 10*3/uL (ref 150–400)
RBC: 4.8 MIL/uL (ref 3.87–5.11)
RDW: 12.5 % (ref 11.5–15.5)
WBC: 9.2 10*3/uL (ref 4.0–10.5)
nRBC: 0 % (ref 0.0–0.2)

## 2022-02-04 LAB — COMPREHENSIVE METABOLIC PANEL
ALT: 16 U/L (ref 0–44)
AST: 17 U/L (ref 15–41)
Albumin: 3.9 g/dL (ref 3.5–5.0)
Alkaline Phosphatase: 61 U/L (ref 38–126)
Anion gap: 8 (ref 5–15)
BUN: 15 mg/dL (ref 6–20)
CO2: 26 mmol/L (ref 22–32)
Calcium: 9.2 mg/dL (ref 8.9–10.3)
Chloride: 105 mmol/L (ref 98–111)
Creatinine, Ser: 0.68 mg/dL (ref 0.44–1.00)
GFR, Estimated: >60 mL/min (ref >60)
Glucose, Bld: 135 mg/dL — ABNORMAL HIGH (ref 70–99)
Potassium: 4 mmol/L (ref 3.5–5.1)
Sodium: 139 mmol/L (ref 135–145)
Total Bilirubin: 0.4 mg/dL (ref 0.3–1.2)
Total Protein: 7.2 g/dL (ref 6.5–8.1)

## 2022-02-04 LAB — LIPASE, BLOOD: Lipase: 44 U/L (ref 11–51)

## 2022-02-04 MED ORDER — NITROFURANTOIN MONOHYD MACRO 100 MG PO CAPS: 100.0000 mg | ORAL_CAPSULE | Freq: Two times a day (BID) | ORAL | 0 refills | Status: AC

## 2022-02-04 MED ORDER — ONDANSETRON HCL 4 MG PO TABS: 4.0000 mg | ORAL_TABLET | Freq: Three times a day (TID) | ORAL | 0 refills | Status: DC | PRN

## 2022-02-04 MED ORDER — ONDANSETRON HCL 4 MG/2ML IJ SOLN
4.0000 mg | Freq: Once | INTRAMUSCULAR | Status: AC
  Administered 2022-02-04: 4 mg via INTRAVENOUS
  Filled 2022-02-04: qty 2

## 2022-02-04 MED ORDER — SODIUM CHLORIDE 0.9 % IV BOLUS
1000.0000 mL | Freq: Once | INTRAVENOUS | Status: AC
  Administered 2022-02-04: 1000 mL via INTRAVENOUS

## 2022-02-04 NOTE — Telephone Encounter (Signed)
Recommendation to report immediately to the emergency department and/or call 911 for further evaluation and management. Follow-up with primary provider at discharge.

## 2022-02-04 NOTE — Discharge Instructions (Addendum)
You were seen today for abdominal pain.  Lab work showed no sign of pancreatitis.  Physical exam shows no sign of appendicitis or concerning finding.I have prescribed Zofran for nausea. I have also prescribed an antibiotic for UTI. Urine culture pending. I recommend follow up with primary care.  If you develop symptoms consistent with your previous kidney stones and are unable to tolerate oral fluids or pain, return to the emergency department.  ?

## 2022-02-04 NOTE — Telephone Encounter (Signed)
Reason for Disposition ? [1] SEVERE pain (e.g., excruciating) AND [2] present > 1 hour ? ?Answer Assessment - Initial Assessment Questions ?1. LOCATION: "Where does it hurt?"  ?    Having abd pain during night last night.  Right sided bad pain.  I thought maybe a kidney stone, dizzy weakness, nausea.   I've passed stones before.  No pain this morning but tension.   I'm very unfocused and dizzy when I stand up.   My urine has a sulfuric smell.   ?I went to PT appt yesterday.   I felt "off".     I had an incident last night felt like a sword behind my eyes, I don't remember driving home from work yesterday.  My friend called me and said I didn't sound right while I was driving home.   I made a sandwich and felt a little better.     About an hour after going to sleep I had terrible pain in my abd and nausea.  I took some nausea medication.    ?My BP 137/98 this morning.    Glucose 26. 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   I don't have anyone to drive me to the [facility] ED.   I'l call my daughter to come take me.   She's not far from here.   I also encouraged her to call 911 that driving herself is not the best option with the symptoms she is having. ? ?2. RADIATION: "Does the pain shoot anywhere else?" (e.g., chest, back) ?    Right lower abd pain during the night as well as a sharp pain behind her eyes.   Has a history of kidney stones so thought it could be that. ?3. ONSET: "When did the pain begin?" (e.g., minutes, hours or days ago)  ?    Last night.   It woke me u ?4. SUDDEN: "Gradual or sudden onset?" ?    Suddenly ?5. PATTERN "Does the pain come and go, or is it constant?" ?   - If constant: "Is it getting better, staying the same, or worsening?"  ?    (Note: Constant means the pain never goes away completely; most serious pain is constant and it progresses)  ?   - If intermittent: "How long does it last?" "Do you have pain now?" ?    (Note: Intermittent means the pain goes away completely between bouts) ?    Pain  not there this morning but I still have the abd tension, dizziness, and just not feeling right.   My urine smells of sulfuric acid. ? ?At this point I referred pt to the ED which she was agreeable to going. ? ? ?6. SEVERITY: "How bad is the pain?"  (e.g., Scale 1-10; mild, moderate, or severe) ?  - MILD (1-3): doesn't interfere with normal activities, abdomen soft and not tender to touch  ?  - MODERATE (4-7): interferes with normal activities or awakens from sleep, abdomen tender to touch  ?  - SEVERE (8-10): excruciating pain, doubled over, unable to do any normal activities  ?    *No Answer* ?7. RECURRENT SYMPTOM: "Have you ever had this type of stomach pain before?" If Yes, ask: "When was the last time?" and "What happened that time?"  ?    *No Answer* ?8. CAUSE: "What do you think is causing the stomach pain?" ?    *No Answer* ?9. RELIEVING/AGGRAVATING FACTORS: "What makes it better or worse?" (e.g., movement, antacids, bowel  movement) ?    *No Answer* ?10. OTHER SYMPTOMS: "Do you have any other symptoms?" (e.g., back pain, diarrhea, fever, urination pain, vomiting) ?      *No Answer* ?11. PREGNANCY: "Is there any chance you are pregnant?" "When was your last menstrual period?" ?      *No Answer* ? ?Protocols used: Abdominal Pain - Female-A-AH ? ?

## 2022-02-04 NOTE — ED Triage Notes (Signed)
Pt c/o abd pain that started last night with nausea. Headache that started yesterday. BP 137/98, HR "elevated" at PT with dizziness and shakiness in R arm, instructed to come to ED. Reports CBG 275 hx of DM.  ?

## 2022-02-04 NOTE — ED Notes (Signed)
Ambulatory with steady gait to bathroom for urine specimen ?

## 2022-02-04 NOTE — ED Provider Notes (Signed)
?[facility] ?Provider Note ? ? ?CSN: [id number] ?Arrival date & time: 02/04/22  4098 ? ?  ? ?History ? ?Chief Complaint  ?Patient presents with  ? Dizziness  ? Abdominal Pain  ? ? ?Ashley Mcconnell is a 61 y.o. female.  Patient presents to the hospital with multiple complaints.  Patient's chief complaint is abdominal pain.  She states that it began in the middle of the night.  States that it began as a feeling of abdominal bloating.  Now complains of right upper quadrant pain.  Endorses nausea, denies vomiting.  Patient also complains of dizziness at physical therapy this morning.  Physical therapist apparently stated that one of her eyes seem to be "hesitant".  PT advised patient to go to the emergency department for evaluation.  Patient endorses dizziness and states that she had a sharp headache last night.  Denies neuro symptoms at this time.  Denies chest pain, denies shortness of breath.  Past medical history significant for type 2 diabetes, left flank pain, hyperglycemia, urinary frequency, right upper quadrant abdominal pain, hypertension, migraine, kidney stone, elevated diaphragm, GERD, laryngeal pharyngeal reflux disease ? ?HPI ? ?  ? ?Home Medications ?Prior to Admission medications   ?Medication Sig Start Date End Date Taking? Authorizing Provider  ?nitrofurantoin, macrocrystal-monohydrate, (MACROBID) 100 MG capsule Take 1 capsule (100 mg total) by mouth 2 (two) times daily for 7 days. 02/04/22 02/11/22 Yes Dorothyann Peng, PA-C  ?ondansetron (ZOFRAN) 4 MG tablet Take 1 tablet (4 mg total) by mouth every 8 (eight) hours as needed for nausea or vomiting. 02/04/22  Yes Dorothyann Peng, PA-C  ?atorvastatin (LIPITOR) 20 MG tablet Take 1 tablet (20 mg total) by mouth daily. 10/23/21   Camillia Herter, NP  ?Blood Glucose Monitoring Suppl (TRUE METRIX METER) w/Device KIT Use as directed 10/25/20   Camillia Herter, NP  ?cyclobenzaprine (FLEXERIL) 5 MG tablet Take 5 mg by mouth at  bedtime. 09/01/21   [provider]  ?diclofenac (CATAFLAM) 50 MG tablet Take 50 mg by mouth 3 (three) times daily as needed. 09/01/21   [provider]  ?glucose blood (TRUE METRIX BLOOD GLUCOSE TEST) test strip Use as instructed 10/25/20   Camillia Herter, NP  ?hydrochlorothiazide (HYDRODIURIL) 12.5 MG tablet Take 1 tablet (12.5 mg total) by mouth daily. 11/16/21 02/14/22  Camillia Herter, NP  ?metFORMIN (GLUCOPHAGE) 500 MG tablet Take 1 tablet (500 mg total) by mouth at bedtime. 11/16/21   Camillia Herter, NP  ?methocarbamol (ROBAXIN) 500 MG tablet Take 500 mg by mouth 3 (three) times daily. 10/01/21   [provider]  ?mupirocin ointment (BACTROBAN) 2 % Apply 1 application topically 2 (two) times daily. 08/25/21   Fenton Foy, NP  ?omeprazole (PRILOSEC) 20 MG capsule TAKE 1 CAPSULE BY MOUTH EVERY DAY 10/23/21   Camillia Herter, NP  ?oxybutynin (DITROPAN-XL) 10 MG 24 hr tablet Take 1 tablet (10 mg total) by mouth daily. 12/04/21 04/03/22  Camillia Herter, NP  ?phentermine 15 MG capsule Take 1 capsule (15 mg total) by mouth every morning. 12/17/21 01/16/22  Camillia Herter, NP  ?triamcinolone ointment (KENALOG) 0.1 % Apply 1 application topically 2 (two) times daily as needed (itching). 04/27/21   Camillia Herter, NP  ?TRUEplus Lancets 28G MISC Use as directed 10/25/20   Camillia Herter, NP  ?Vitamin D, Ergocalciferol, (DRISDOL) 1.25 MG (50000 UNIT) CAPS capsule Take 1 capsule (50,000 Units total) by mouth every 7 (seven) days.  11/17/21   Camillia Herter, NP  ?   ? ?Allergies    ?Bactrim [sulfamethoxazole-trimethoprim], Fish allergy, Iodinated contrast media, and Shellfish allergy   ? ?Review of Systems   ?Review of Systems  ?Constitutional:  Negative for fever.  ?Respiratory:  Negative for shortness of breath.   ?Cardiovascular:  Negative for chest pain.  ?Gastrointestinal:  Positive for abdominal pain and nausea. Negative for constipation, diarrhea and vomiting.  ?Genitourinary:  Positive for  frequency. Negative for dysuria, flank pain and hematuria.  ?Neurological:  Positive for dizziness and headaches.  ? ?Physical Exam ?Updated Vital Signs ?BP 126/67   Pulse 80   Temp 97.7 ?F (36.5 ?C) (Oral)   Resp 18   Ht '5\' 5"'  (1.651 m)   Wt 105.7 kg   SpO2 100%   BMI 38.77 kg/m?  ?Physical Exam ?Vitals and nursing note reviewed.  ?Constitutional:   ?   Appearance: She is obese.  ?HENT:  ?   Head: Normocephalic and atraumatic.  ?Eyes:  ?   Extraocular Movements: Extraocular movements intact.  ?   Pupils: Pupils are equal, round, and reactive to light.  ?Cardiovascular:  ?   Rate and Rhythm: Normal rate and regular rhythm.  ?   Heart sounds: Normal heart sounds.  ?Pulmonary:  ?   Effort: Pulmonary effort is normal.  ?   Breath sounds: Normal breath sounds.  ?Abdominal:  ?   General: Abdomen is flat. There is no distension.  ?   Palpations: Abdomen is soft.  ?   Tenderness: There is no abdominal tenderness.  ?Skin: ?   General: Skin is warm and dry.  ?Neurological:  ?   Mental Status: She is alert.  ?   GCS: GCS eye subscore is 4. GCS verbal subscore is 5. GCS motor subscore is 6.  ?   Sensory: Sensation is intact.  ?   Motor: Motor function is intact. No weakness.  ?   Coordination: Coordination normal. Heel to Shin Test normal.  ?   Gait: Gait is intact.  ?   Comments: CN II through VII, XI, XII intact  ? ? ?ED Results / Procedures / Treatments   ?Labs ?(all labs ordered are listed, but only abnormal results are displayed) ?Labs Reviewed  ?COMPREHENSIVE METABOLIC PANEL - Abnormal; Notable for the following components:  ?    Result Value  ? Glucose, Bld 135 (*)   ? All other components within normal limits  ?URINALYSIS, ROUTINE W REFLEX MICROSCOPIC - Abnormal; Notable for the following components:  ? Color, Urine STRAW (*)   ? Leukocytes,Ua MODERATE (*)   ? Bacteria, UA RARE (*)   ? All other components within normal limits  ?URINE CULTURE  ?CBC WITH DIFFERENTIAL/PLATELET  ?LIPASE, BLOOD   ? ? ?EKG ?None ? ?Radiology ?No results found. ? ?Procedures ?Procedures  ? ? ?Medications Ordered in ED ?Medications  ?ondansetron (ZOFRAN) injection 4 mg (4 mg Intravenous Given 02/04/22 1035)  ?sodium chloride 0.9 % bolus 1,000 mL (1,000 mLs Intravenous New Bag/Given 02/04/22 1034)  ? ? ?ED Course/ Medical Decision Making/ A&P ?  ?                        ?Medical Decision Making ? ?This patient presents to the ED for concern of abdominal pain, this involves an extensive number of treatment options, and is a complaint that carries with it a high risk of complications and morbidity.  The differential diagnosis includes pancreatitis,  appendicitis, gastritis, and others ? ? ?Co morbidities that complicate the patient evaluation ? ?History of right upper quadrant pain ? ? ?Additional history obtained: ? ? ?External records from outside source obtained and reviewed including progress notes from primary care detailing illnesses ? ? ?Lab Tests: ? ?I Ordered, and personally interpreted labs.  The pertinent results include: Alysis: Straw-colored urine, moderate leukocytes, rare bacteria; CMP: Glucose 135; lipase: 44; CBC: Grossly normal ? ? ? ?Cardiac Monitoring: / EKG: ? ?The patient was maintained on a cardiac monitor.  I personally viewed and interpreted the cardiac monitored which showed an underlying rhythm of: Sinus rhythm ? ? ?Problem List / ED Course / Critical interventions / Medication management ? ?I ordered medication including Zofran for nausea, fluids for possible dehydration ?Reevaluation of the patient after these medicines showed that the patient improved ?I have reviewed the patients home medicines and have made adjustments as needed ? ? ?Test / Admission - Considered: ? ?The patient feels much better after fluid administration and Zofran.  Lab work was grossly unremarkable.  Signs of lingering urinary tract infection.  Neuro exam was grossly normal.  Patient's headache has been occurring intermittently  since her injury, no new features. ? ?Lipase normal, no abdominal tenderness.  I feel that appendicitis very unlikely.  No sign of pancreatitis with lipase being normal.  Patient's gallbladder was previously removed. ? ?This is likely

## 2022-02-04 NOTE — Telephone Encounter (Addendum)
?  Chief Complaint: severe abd pain during the night, sharp pain behind her eyes during the night ?Symptoms: dizziness, unfocused, just don't feel right, urine smells of sulfuric acid, don't remember driving home yesterday ?Frequency: During the night and this morning.   ?Pertinent Negatives: Patient denies vomiting but having nausea real bad. ?Disposition: '[x]'$ ED /'[]'$ Urgent Care (no appt availability in office) / '[]'$ Appointment(In office/virtual)/ '[]'$  [facility]/ '[]'$ Home Care/ '[]'$ Refused Recommended Disposition /'[]'$ [facility]/ '[]'$  Follow-up with PCP ?Additional Notes: Going to [facility] ED.   Information sent to [facility] for Durene Fruits, NP  ? ?I called into [facility] and spoke with Butch Penny making her aware of the ED referral so Durene Fruits, NP would be aware. ?

## 2022-02-06 LAB — URINE CULTURE: Culture: 40000 — AB

## 2022-02-07 NOTE — Telephone Encounter (Signed)
Post ED Visit - Positive Culture Follow-up ? ?Culture report reviewed by antimicrobial stewardship pharmacist: ?[place] Team ?'[]'$  Elenor Quinones, Pharm.D. ?'[]'$  Heide Guile, Pharm.D., BCPS AQ-ID ?'[]'$  Parks Neptune, Pharm.D., BCPS ?'[]'$  Alycia Rossetti, Pharm.D., BCPS ?'[]'$  [place], Pharm.D., BCPS, AAHIVP ?'[]'$  Legrand Como, Pharm.D., BCPS, AAHIVP ?'[]'$  Salome Arnt, PharmD, BCPS ?'[]'$  Johnnette Gourd, PharmD, BCPS ?'[]'$  Hughes Better, PharmD, BCPS ?'[]'$  Leeroy Cha, PharmD ?'[]'$  Laqueta Linden, PharmD, BCPS ?'[x]'$  Albertina Parr, PharmD ? ?[place] Team ?'[]'$  Leodis Sias, PharmD ?'[]'$  Lindell Spar, PharmD ?'[]'$  Royetta Asal, PharmD ?'[]'$  Graylin Shiver, Rph ?'[]'$  Rema Fendt) Glennon Mac, PharmD ?'[]'$  Arlyn Dunning, PharmD ?'[]'$  Netta Cedars, PharmD ?'[]'$  Dia Sitter, PharmD ?'[]'$  Leone Haven, PharmD ?'[]'$  Gretta Arab, PharmD ?'[]'$  Theodis Shove, PharmD ?'[]'$  Peggyann Juba, PharmD ?'[]'$  Reuel Boom, PharmD ? ? ?Positive urine culture ?Treated with Nitrofurantoin, organism sensitive to the same and no further patient follow-up is required at this time. ? ?Rosie Fate ?02/07/2022, 10:56 AM ?  ?

## 2022-02-10 ENCOUNTER — Other Ambulatory Visit: Payer: Self-pay | Admitting: Family

## 2022-02-10 ENCOUNTER — Encounter: Payer: Self-pay | Admitting: Family

## 2022-02-10 DIAGNOSIS — R110 Nausea: Secondary | ICD-10-CM

## 2022-02-10 MED ORDER — ONDANSETRON 8 MG PO TBDP: 8.0000 mg | ORAL_TABLET | Freq: Three times a day (TID) | ORAL | 1 refills | Status: AC | PRN

## 2022-02-12 NOTE — Progress Notes (Signed)
? ? ?Patient ID: Ashley Mcconnell, female    DOB: 03/25/1961  MRN: [id number] ? ?CC: Hypertension Follow-Up ? ?Subjective: ?Ashley Mcconnell is a 61 y.o. female who presents for hypertension follow-up.  ? ?Her concerns today include:  ?Hypertension follow-up: ?11/16/2021: ?- Continue Hydrochlorothiazide as prescribed.  ? ?02/16/2022: ?Doing well on current regimen. No side effects. No issues/concerns. Denies chest pain, shortness of breath, worst headache of life and additional red flag symptoms.  ? ?2. Diabetes type 2 follow-up: ?11/16/2021: ?- Hemoglobin A1c today at goal at 6.9%, goal < 7%. This is slightly higher than previous 6.7% on 07/13/2021. ?- Continue Metformin as prescribed.  ? ?02/16/2022: ?- Doing well on current regimen, no issues/concerns.  ?- Fasting home blood sugars for the last 7 days 157, 138, 187, 150, 139, 183, 156, 166 ? ?3. Hyperlipidemia follow-up: ?11/16/2021: ?- Continue Atorvastatin as prescribed. ? ?02/16/2022: ?Doing well on current regimen, no issues/concerns.  ? ?4. Emergency department follow-up: ?02/04/2022 [facility] Emergency Department per MD note: ?Medical Decision Making ?  ?This patient presents to the ED for concern of abdominal pain, this involves an extensive number of treatment options, and is a complaint that carries with it a high risk of complications and morbidity.  The differential diagnosis includes pancreatitis, appendicitis, gastritis, and others ?  ?  ?Co morbidities that complicate the patient evaluation ?  ?History of right upper quadrant pain ?  ?  ?Additional history obtained: ?  ?  ?External records from outside source obtained and reviewed including progress notes from primary care detailing illnesses ?  ?  ?Lab Tests: ?  ?I Ordered, and personally interpreted labs.  The pertinent results include: Alysis: Straw-colored urine, moderate leukocytes, rare bacteria; CMP: Glucose 135; lipase: 44; CBC: Grossly normal ?  ?  ?  ?Cardiac Monitoring: / EKG: ?  ?The patient was  maintained on a cardiac monitor.  I personally viewed and interpreted the cardiac monitored which showed an underlying rhythm of: Sinus rhythm ?  ?  ?Problem List / ED Course / Critical interventions / Medication management ?  ?I ordered medication including Zofran for nausea, fluids for possible dehydration ?Reevaluation of the patient after these medicines showed that the patient improved ?I have reviewed the patients home medicines and have made adjustments as needed ?  ?  ?Test / Admission - Considered: ?  ?The patient feels much better after fluid administration and Zofran.  Lab work was grossly unremarkable.  Signs of lingering urinary tract infection.  Neuro exam was grossly normal.  Patient's headache has been occurring intermittently since her injury, no new features. ? ?Lipase normal, no abdominal tenderness.  I feel that appendicitis very unlikely.  No sign of pancreatitis with lipase being normal.  Patient's gallbladder was previously removed. ? ?This is likely gastritis, possibly some GERD complication.  I see no reason for further work-up at this time.  The patient feels much better.  Plan to discharge home with Zofran and antibiotics for still lingering UTI symptoms. Urine culture ordered.  Return precautions provided ?  ?02/16/2022: ?Patient reports she had an unpleasant experience at recent Emergency Department visit. Urinary symptoms lingering and doesn't feel antibiotics helped. Reports the CT scan recommended per her physical therapist wasn't completed. Reports the MD at the emergency department stated CT wasn't needed. Patient reports feeling overall lethargic and decreased energy since worker's compensation incident at her job over 6 months ago. She is followed by several of her employers doctors to determine extent of injury. Has  an appointment scheduled with Neurology next month pending. Has been seen by Orthopedics as well. Concern for two spots returning to left breast. Considered if this  could be related to recurrent breast infections in the past. Having some constipation. Taking Miralax without relief. Feels there may be a blockage.  ? ?Patient Active Problem List  ? Diagnosis Date Noted  ? Vitamin D deficiency 11/17/2021  ? Neck pain 10/01/2021  ? Pain in joint of right shoulder 09/30/2021  ? Lump in lower inner quadrant of right breast 08/25/2021  ? COVID 05/30/2021  ? Bacterial vaginitis 04/28/2021  ? Obesity (BMI 30-39.9) 01/27/2021  ? Chest pain of uncertain etiology 66/29/4765  ? Diabetes mellitus due to underlying condition with hyperosmolarity without coma, without long-term current use of insulin ([place]) 10/31/2020  ? Abdominal pain 10/29/2020  ? Accumulation of fluid in tissues 10/29/2020  ? Arthralgia of lower leg 10/29/2020  ? Blood in feces 10/29/2020  ? 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 Can't get food down 10/29/2020  ? Fatigue 10/29/2020  ? Hemorrhoids, internal 10/29/2020  ? History of colon polyps 10/29/2020  ? Infection of the upper respiratory tract 10/29/2020  ? 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 Pulmonary embolism ([place])   ? Morton's neuroma   ? Migraine   ? Hypertension   ? Cardiomyopathy ([place])   ? Type 2 diabetes mellitus ([place]) 10/25/2020  ? Hyperlipemia 10/25/2020  ? Blood in urine 07/14/2020  ? Nephrolithiasis 10/10/2018  ? Benign essential hypertension 06/01/2018  ? Family history of colon cancer requiring screening colonoscopy 02/09/2018  ? Family history of colonic polyps 02/09/2018  ? Hyperglycemia 11/09/2017  ? Acute infection of nasal sinus 12/10/2015  ? Abdominal pain, right upper quadrant 12/10/2015  ? Prediabetes 12/10/2015  ? Chill 12/10/2015  ? FOM (frequency of micturition) 12/10/2015  ? Dyspnea 07/25/2015  ? 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 GERD (gastroesophageal reflux disease) 07/25/2015  ? Obesity 07/25/2015  ? Chest wall pain 07/25/2015  ? 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 Calculus of kidney 02/06/2013  ? Left flank pain  02/06/2013  ? Leukocytosis 02/06/2013  ?  ? ?Current Outpatient Medications on File Prior to Visit  ?Medication Sig Dispense Refill  ? Blood Glucose Monitoring Suppl (TRUE METRIX METER) w/Device KIT Use as directed 1 kit 0  ? cyclobenzaprine (FLEXERIL) 5 MG tablet Take 5 mg by mouth at bedtime.    ? diclofenac (CATAFLAM) 50 MG tablet Take 50 mg by mouth 3 (three) times daily as needed.    ? glucose blood (TRUE METRIX BLOOD GLUCOSE TEST) test strip Use as instructed 100 each 12  ? methocarbamol (ROBAXIN) 500 MG tablet Take 500 mg by mouth 3 (three) times daily.    ? mupirocin ointment (BACTROBAN) 2 % Apply 1 application topically 2 (two) times daily. 22 g 0  ? ondansetron (ZOFRAN-ODT) 8 MG disintegrating tablet Take 1 tablet (8 mg total) by mouth every 8 (eight) hours as needed for nausea or vomiting. 30 tablet 1  ? oxybutynin (DITROPAN-XL) 10 MG 24 hr tablet Take 1 tablet (10 mg total) by mouth daily. 120 tablet 0  ? phentermine 15 MG capsule Take 1 capsule (15 mg total) by mouth every morning. 30 capsule 0  ? triamcinolone ointment (KENALOG) 0.1 % Apply 1 application topically 2 (two) times daily as needed (itching). 80 g 1  ? TRUEplus Lancets 28G MISC Use as directed 100 each 4  ? Vitamin D, Ergocalciferol, (DRISDOL) 1.25 MG (50000 UNIT) CAPS capsule Take 1 capsule (50,000  Units total) by mouth every 7 (seven) days. 4 capsule 3  ? ?No current facility-administered medications on file prior to visit.  ? ? ?Allergies  ?Allergen Reactions  ? Bactrim [Sulfamethoxazole-Trimethoprim]   ?  Swelling tongue and itching mouth.  ? Fish Allergy Rash  ?  [name] is only fish she does not have allergy to  ? Iodinated Contrast Media Other (See Comments)  ?  Rash ? ?Rash ?  ? Shellfish Allergy Itching and Rash  ? ? ?Social History  ? ?Socioeconomic History  ? Marital status: Single  ?  Spouse name: Not on file  ? Number of children: 3  ? Years of education: College  ? Highest education level: Not on file  ?Occupational History  ?   Employer: OTHER  ?  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   ?Tobacco Use  ? Smoking status: Former  ?  Packs/day: 0.20  ?  Years: 2.00  ?  Pack years: 0.40  ?  Types: Cigarettes  ?  Quit date: 07/24/1986  ?  Years sin

## 2022-02-16 ENCOUNTER — Ambulatory Visit: Payer: Managed Care, Other (non HMO) | Admitting: Family

## 2022-02-16 VITALS — BP 111/67 | HR 89 | Temp 98.0°F | Resp 18 | Ht 65.0 in | Wt 230.0 lb

## 2022-02-16 DIAGNOSIS — N390 Urinary tract infection, site not specified: Secondary | ICD-10-CM

## 2022-02-16 DIAGNOSIS — Z1211 Encounter for screening for malignant neoplasm of colon: Secondary | ICD-10-CM

## 2022-02-16 DIAGNOSIS — R1011 Right upper quadrant pain: Secondary | ICD-10-CM

## 2022-02-16 DIAGNOSIS — N61 Mastitis without abscess: Secondary | ICD-10-CM

## 2022-02-16 DIAGNOSIS — R110 Nausea: Secondary | ICD-10-CM | POA: Diagnosis not present

## 2022-02-16 DIAGNOSIS — E785 Hyperlipidemia, unspecified: Secondary | ICD-10-CM | POA: Diagnosis not present

## 2022-02-16 DIAGNOSIS — I10 Essential (primary) hypertension: Secondary | ICD-10-CM

## 2022-02-16 DIAGNOSIS — R399 Unspecified symptoms and signs involving the genitourinary system: Secondary | ICD-10-CM

## 2022-02-16 DIAGNOSIS — Z800 Family history of malignant neoplasm of digestive organs: Secondary | ICD-10-CM

## 2022-02-16 DIAGNOSIS — Z7689 Persons encountering health services in other specified circumstances: Secondary | ICD-10-CM

## 2022-02-16 DIAGNOSIS — E119 Type 2 diabetes mellitus without complications: Secondary | ICD-10-CM | POA: Diagnosis not present

## 2022-02-16 DIAGNOSIS — K219 Gastro-esophageal reflux disease without esophagitis: Secondary | ICD-10-CM

## 2022-02-16 LAB — POCT GLYCOSYLATED HEMOGLOBIN (HGB A1C): Hemoglobin A1C: 6.7 % — AB (ref 4.0–5.6)

## 2022-02-16 MED ORDER — OMEPRAZOLE 20 MG PO CPDR: DELAYED_RELEASE_CAPSULE | ORAL | 1 refills | Status: DC

## 2022-02-16 MED ORDER — HYDROCHLOROTHIAZIDE 12.5 MG PO TABS: 12.5000 mg | ORAL_TABLET | Freq: Every day | ORAL | 0 refills | Status: DC

## 2022-02-16 MED ORDER — METFORMIN HCL 500 MG PO TABS: 500.0000 mg | ORAL_TABLET | Freq: Every day | ORAL | 0 refills | Status: DC

## 2022-02-16 MED ORDER — ATORVASTATIN CALCIUM 20 MG PO TABS: 20.0000 mg | ORAL_TABLET | Freq: Every day | ORAL | 1 refills | Status: DC

## 2022-02-16 NOTE — Progress Notes (Signed)
Diabetes discussed in office.

## 2022-02-16 NOTE — Progress Notes (Signed)
Pt presents for hypertension, diabetes, and hyperlipidemia f/u  ?

## 2022-02-17 LAB — URINALYSIS, ROUTINE W REFLEX MICROSCOPIC
Bilirubin, UA: NEGATIVE
Glucose, UA: NEGATIVE
Nitrite, UA: NEGATIVE
RBC, UA: NEGATIVE
Specific Gravity, UA: 1.023 (ref 1.005–1.030)
Urobilinogen, Ur: 1 mg/dL (ref 0.2–1.0)
pH, UA: 6.5 (ref 5.0–7.5)

## 2022-02-17 LAB — MICROSCOPIC EXAMINATION: Casts: NONE SEEN /lpf

## 2022-02-17 LAB — LIPID PANEL
Chol/HDL Ratio: 2.6 ratio (ref 0.0–4.4)
Cholesterol, Total: 132 mg/dL (ref 100–199)
HDL: 50 mg/dL (ref >39)
LDL Chol Calc (NIH): 64 mg/dL (ref 0–99)
Triglycerides: 99 mg/dL (ref 0–149)
VLDL Cholesterol Cal: 18 mg/dL (ref 5–40)

## 2022-02-17 NOTE — Progress Notes (Signed)
Cholesterol stable. Continue Atorvastatin for cholesterol maintenance.

## 2022-02-17 NOTE — Progress Notes (Signed)
Urinalysis with bacteria. Awaiting urine culture for further evaluation.

## 2022-02-19 LAB — URINE CULTURE

## 2022-02-19 NOTE — Progress Notes (Signed)
Urine culture normal.

## 2022-02-23 ENCOUNTER — Encounter: Payer: Self-pay | Admitting: Family

## 2022-02-24 ENCOUNTER — Telehealth: Payer: Self-pay | Admitting: Family

## 2022-02-24 ENCOUNTER — Other Ambulatory Visit: Payer: Self-pay | Admitting: Family

## 2022-02-24 DIAGNOSIS — Z7689 Persons encountering health services in other specified circumstances: Secondary | ICD-10-CM

## 2022-02-24 MED ORDER — PHENTERMINE HCL 15 MG PO CAPS: 15.0000 mg | ORAL_CAPSULE | ORAL | 0 refills | Status: DC

## 2022-02-24 NOTE — Telephone Encounter (Signed)
On 02/16/2022 referred to Gastroenterology for abdominal pain. Contact information listed below if patient would like to contact them. I have also sent an inquiry to our referral coordinator for status update of referral. If abdominal pain worsening or becoming severe report to urgent care or emergency department for immediate evaluation.  ? ?[facility]   ?[address], [place] [address] ?Phone #: [phone] ? ?[place] referral is in regards to concern of breast infections discussed during visit on 02/16/2022. ? ?Patient welcome to pickup Phentermine prescription when best for her. Will begin at lower dose 15 mg. Reminder to schedule weight check in 4 weeks.  ? ?

## 2022-02-25 ENCOUNTER — Encounter: Payer: Self-pay | Admitting: Physician Assistant

## 2022-03-01 ENCOUNTER — Other Ambulatory Visit: Payer: Self-pay | Admitting: Family

## 2022-03-01 DIAGNOSIS — Z7689 Persons encountering health services in other specified circumstances: Secondary | ICD-10-CM

## 2022-03-01 MED ORDER — PHENTERMINE HCL 30 MG PO CAPS: 30.0000 mg | ORAL_CAPSULE | ORAL | 0 refills | Status: DC

## 2022-03-01 NOTE — Telephone Encounter (Signed)
The last time you had the 15 mg dose was in March. Let's increase to 30 mg. We will plan to increase again at 4 week weight check.

## 2022-03-09 ENCOUNTER — Other Ambulatory Visit: Payer: Self-pay | Admitting: Family

## 2022-03-09 DIAGNOSIS — N3281 Overactive bladder: Secondary | ICD-10-CM

## 2022-03-09 DIAGNOSIS — E559 Vitamin D deficiency, unspecified: Secondary | ICD-10-CM

## 2022-03-09 DIAGNOSIS — N3289 Other specified disorders of bladder: Secondary | ICD-10-CM

## 2022-03-10 NOTE — Telephone Encounter (Signed)
Requested medication (s) are due for refill today: Yes  Requested medication (s) are on the active medication list: Yes  Last refill:  11/17/21  Future visit scheduled: No  Notes to clinic:  Not delegated.    Requested Prescriptions  Pending Prescriptions Disp Refills   Vitamin D, Ergocalciferol, (DRISDOL) 1.25 MG (50000 UNIT) CAPS capsule [Pharmacy Med Name: VITAMIN D2 1.'25MG'$ (50,000 UNIT)] 4 capsule 3    Sig: TAKE 1 CAPSULE (50,000 UNITS TOTAL) BY MOUTH EVERY 7 (SEVEN) DAYS     Endocrinology:  Vitamins - Vitamin D Supplementation 2 Failed - 03/09/2022  8:02 PM      Failed - Manual Review: Route requests for 50,000 IU strength to the provider      Failed - Vitamin D in normal range and within 360 days    Vit D, 25-Hydroxy  Date Value Ref Range Status  11/16/2021 26.9 (L) 30.0 - 100.0 ng/mL Final    Comment:    Vitamin D deficiency has been defined by the Institute of Medicine and an Endocrine Society practice guideline as a level of serum 25-OH vitamin D less than 20 ng/mL (1,2). The Endocrine Society went on to further define vitamin D insufficiency as a level between 21 and 29 ng/mL (2). 1. IOM (Institute of Medicine). 2010. Dietary reference    intakes for calcium and D. [place]: The    [employer]. 2. Holick MF, Binkley [place], Bischoff-Ferrari HA, et al.    Evaluation, treatment, and prevention of vitamin D    deficiency: an Endocrine Society clinical practice    guideline. JCEM. 2011 Jul; 96(7):1911-30.          Passed - Ca in normal range and within 360 days    Calcium  Date Value Ref Range Status  02/04/2022 9.2 8.9 - 10.3 mg/dL Final         Passed - Valid encounter within last 12 months    Recent Outpatient Visits           3 weeks ago Essential (primary) hypertension   Primary Care at [facility], Amy J, NP   3 months ago Essential (primary) hypertension   Primary Care at [facility], Amy J, NP   5 months ago  Abscess of right breast   Primary Care at [facility], Amy J, NP   6 months ago Lump in lower inner quadrant of right breast   Primary Care at [facility], Kriste Basque, NP   6 months ago Essential hypertension   Primary Care at [facility], [place], NP       Future Appointments             In 3 months Camillia Herter, NP Primary Care at [facility]             Signed Prescriptions Disp Refills   oxybutynin (DITROPAN-XL) 10 MG 24 hr tablet 90 tablet 1    Sig: TAKE 1 TABLET BY [place]     Urology:  Bladder Agents Passed - 03/09/2022  8:02 PM      Passed - Valid encounter within last 12 months    Recent Outpatient Visits           3 weeks ago Essential (primary) hypertension   Primary Care at [facility], Amy J, NP   3 months ago Essential (primary) hypertension   Primary Care at [facility], Amy J, NP   5 months ago Abscess  of right breast   Primary Care at [facility], Amy J, NP   6 months ago Lump in lower inner quadrant of right breast   Primary Care at [facility], Kriste Basque, NP   6 months ago Essential hypertension   Primary Care at [facility], Flonnie Hailstone, NP       Future Appointments             In 3 months Camillia Herter, NP Primary Care at [facility]             2

## 2022-03-12 ENCOUNTER — Encounter: Payer: Self-pay | Admitting: *Deleted

## 2022-03-12 ENCOUNTER — Encounter: Payer: Self-pay | Admitting: Family

## 2022-03-18 ENCOUNTER — Ambulatory Visit: Payer: Managed Care, Other (non HMO) | Admitting: Physician Assistant

## 2022-03-19 ENCOUNTER — Other Ambulatory Visit: Payer: Managed Care, Other (non HMO)

## 2022-03-22 ENCOUNTER — Other Ambulatory Visit: Payer: Managed Care, Other (non HMO)

## 2022-03-23 ENCOUNTER — Other Ambulatory Visit: Payer: Managed Care, Other (non HMO)

## 2022-03-23 DIAGNOSIS — E559 Vitamin D deficiency, unspecified: Secondary | ICD-10-CM

## 2022-03-23 NOTE — Addendum Note (Signed)
Addended by: Elmon Else on: 03/23/2022 03:29 PM   Modules accepted: Orders

## 2022-03-24 LAB — VITAMIN D 25 HYDROXY (VIT D DEFICIENCY, FRACTURES): Vit D, 25-Hydroxy: 43.5 ng/mL (ref 30.0–100.0)

## 2022-03-29 DIAGNOSIS — N644 Mastodynia: Secondary | ICD-10-CM | POA: Insufficient documentation

## 2022-03-29 DIAGNOSIS — Z803 Family history of malignant neoplasm of breast: Secondary | ICD-10-CM | POA: Insufficient documentation

## 2022-03-30 NOTE — Progress Notes (Unsigned)
Patient ID: Ashley Mcconnell, female    DOB: May 05, 1961  MRN: [id number]  CC: Weight Check   Subjective: Ashley Mcconnell is a 61 y.o. female who presents for weight check.   Her concerns today include:  02/23/2022 The last time you had the 15 mg dose was in March. Let's increase to 30 mg. We will plan to increase again at 4 week weight check.     Patient Active Problem List   Diagnosis Date Noted   Vitamin D deficiency 11/17/2021   Neck pain 10/01/2021   Pain in joint of right shoulder 09/30/2021   Lump in lower inner quadrant of right breast 08/25/2021   COVID 05/30/2021   Bacterial vaginitis 04/28/2021   Obesity (BMI 30-39.9) 01/27/2021   Chest pain of uncertain etiology 63/78/5885   Diabetes mellitus due to underlying condition with hyperosmolarity without coma, without long-term current use of insulin ([place]) 10/31/2020   Abdominal pain 10/29/2020   Accumulation of fluid in tissues 10/29/2020   Arthralgia of lower leg 10/29/2020   Blood in feces 10/29/2020   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   Can't get food down 10/29/2020   Fatigue 10/29/2020   Hemorrhoids, internal 10/29/2020   History of colon polyps 10/29/2020   Infection of the upper respiratory tract 10/29/2020   Interdigital neuralgia 10/29/2020   Pulmonary embolism ([place])    Morton's neuroma    Migraine    Hypertension    Cardiomyopathy ([place])    Type 2 diabetes mellitus ([place]) 10/25/2020   Hyperlipemia 10/25/2020   Blood in urine 07/14/2020   Nephrolithiasis 10/10/2018   Benign essential hypertension 06/01/2018   Family history of colon cancer requiring screening colonoscopy 02/09/2018   Family history of colonic polyps 02/09/2018   Hyperglycemia 11/09/2017   Acute infection of nasal sinus 12/10/2015   Abdominal pain, right upper quadrant 12/10/2015   Prediabetes 12/10/2015   Chill 12/10/2015   FOM (frequency of micturition) 12/10/2015   Dyspnea 07/25/2015   LPRD (laryngopharyngeal reflux disease)  07/25/2015   GERD (gastroesophageal reflux disease) 07/25/2015   Obesity 07/25/2015   Chest wall pain 07/25/2015   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   Calculus of kidney 02/06/2013   Left flank pain 02/06/2013   Leukocytosis 02/06/2013     Current Outpatient Medications on File Prior to Visit  Medication Sig Dispense Refill   atorvastatin (LIPITOR) 20 MG tablet Take 1 tablet (20 mg total) by mouth daily. 90 tablet 1   Blood Glucose Monitoring Suppl (TRUE METRIX METER) w/Device KIT Use as directed 1 kit 0   cyclobenzaprine (FLEXERIL) 5 MG tablet Take 5 mg by mouth at bedtime.     diclofenac (CATAFLAM) 50 MG tablet Take 50 mg by mouth 3 (three) times daily as needed.     glucose blood (TRUE METRIX BLOOD GLUCOSE TEST) test strip Use as instructed 100 each 12   hydrochlorothiazide (HYDRODIURIL) 12.5 MG tablet Take 1 tablet (12.5 mg total) by mouth daily. 120 tablet 0   metFORMIN (GLUCOPHAGE) 500 MG tablet Take 1 tablet (500 mg total) by mouth at bedtime. 120 tablet 0   methocarbamol (ROBAXIN) 500 MG tablet Take 500 mg by mouth 3 (three) times daily.     mupirocin ointment (BACTROBAN) 2 % Apply 1 application topically 2 (two) times daily. 22 g 0   omeprazole (PRILOSEC) 20 MG capsule TAKE 1 CAPSULE BY MOUTH EVERY DAY 90 capsule 1   ondansetron (ZOFRAN-ODT) 8 MG disintegrating tablet Take 1 tablet (8 mg total) by mouth every 8 (eight) hours as  needed for nausea or vomiting. 30 tablet 1   oxybutynin (DITROPAN-XL) 10 MG 24 hr tablet TAKE 1 TABLET BY MOUTH EVERY DAY 90 tablet 1   phentermine 30 MG capsule Take 1 capsule (30 mg total) by mouth every morning. 30 capsule 0   triamcinolone ointment (KENALOG) 0.1 % Apply 1 application topically 2 (two) times daily as needed (itching). 80 g 1   TRUEplus Lancets 28G MISC Use as directed 100 each 4   Vitamin D, Ergocalciferol, (DRISDOL) 1.25 MG (50000 UNIT) CAPS capsule Take 1 capsule (50,000 Units total) by mouth every 7 (seven) days. 4 capsule 3   No  current facility-administered medications on file prior to visit.    Allergies  Allergen Reactions   Bactrim [Sulfamethoxazole-Trimethoprim]     Swelling tongue and itching mouth.   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   Tobacco Use   Smoking status: Former    Packs/day: 0.20    Years: 2.00    Total pack years: 0.40    Types: Cigarettes    Quit date: 07/24/1986    Years since quitting: 35.7   Smokeless tobacco: Never   Tobacco comments:    social smoker  Substance and Sexual Activity   Alcohol use: Yes    Alcohol/week: 0.0 standard drinks of alcohol    Comment: 1 drink on New Year's Eve   Drug use: No   Sexual activity: Not Currently    Birth control/protection: Surgical  Other Topics Concern   Not on file  Social History Narrative   Patient lives at home with her family.   Caffeine Use: 1 cup of tea two times weekly   Social Determinants of Health   Financial Resource Strain: Not on file  Food Insecurity: Not on file  Transportation Needs: Not on file  Physical Activity: Not on file  Stress: Not on file  Social Connections: Not on file  Intimate Partner Violence: Not on file    Family History  Problem Relation Age of Onset   Breast cancer Mother    Stroke Father    Heart Problems Father        heart bypass   Alzheimer's disease Paternal Aunt    Alzheimer's disease Paternal Uncle    Heart Problems Paternal Grandmother        enlarged heart   Alzheimer's disease Paternal Aunt    Alzheimer's disease Paternal Aunt    Cancer Sister        colorectal     Past Surgical History:  Procedure Laterality Date   ABDOMINAL HYSTERECTOMY     CARDIAC SURGERY  04/2014   Catherization    CHOLECYSTECTOMY     CYSTOSCOPY/URETEROSCOPY/HOLMIUM LASER/STENT PLACEMENT Left 10/10/2018   Procedure: CYSTOSCOPY/URETEROSCOPY/HOLMIUM LASER/STENT PLACEMENT;  Surgeon: Ceasar Mons, MD;  Location: [facility];  Service: Urology;  Laterality: Left;   LITHOTRIPSY     x5   TONSILLECTOMY  1969    ROS: Review of Systems Negative except as stated above  PHYSICAL EXAM: There were no vitals taken for this visit.  Physical Exam  {female adult master:310786} {female adult master:310785}     Latest Ref Rng & Units 02/04/2022   10:04 AM 11/16/2021    4:16 PM 07/13/2021    9:00 AM  CMP  Glucose 70 - 99 mg/dL 135  169  148   BUN 6 - 20 mg/dL _0 Creatinine 0.44 - 1.00 mg/dL 0.68  0.72  0.81   Sodium 135 - 145 mmol/L 139  142  145   Potassium 3.5 - 5.1 mmol/L 4.0  4.2  3.9   Chloride 98 - 111 mmol/L 105  100  102   CO2 22 - 32 mmol/L _1 Calcium 8.9 - 10.3 mg/dL 9.2  9.9  9.6   Total Protein 6.5 - 8.1 g/dL 7.2  7.8    Total Bilirubin 0.3 - 1.2 mg/dL 0.4  0.3    Alkaline Phos 38 - 126 U/L 61  87    AST 15 - 41 U/L 17  12    ALT 0 - 44 U/L 16  11     Lipid Panel     Component Value Date/Time   CHOL 132 02/16/2022 0901   TRIG 99 02/16/2022 0901   HDL 50 02/16/2022 0901   CHOLHDL 2.6 02/16/2022 0901   LDLCALC 64 02/16/2022 0901    CBC    Component Value Date/Time   WBC 9.2 02/04/2022 1004   RBC 4.80 02/04/2022 1004   HGB 13.7 02/04/2022 1004   HGB 14.3 11/16/2021 1616   HCT 40.2 02/04/2022 1004   HCT 41.8 11/16/2021 1616   PLT 299 02/04/2022 1004   PLT 388 11/16/2021 1616   MCV 83.8 02/04/2022 1004   MCV 83 11/16/2021 1616   MCH 28.5 02/04/2022 1004   MCHC 34.1 02/04/2022 1004   RDW 12.5 02/04/2022 1004   RDW 12.7 11/16/2021 1616   LYMPHSABS 2.2 02/04/2022 1004   MONOABS 0.6 02/04/2022 1004   EOSABS 0.1 02/04/2022  1004   BASOSABS 0.0 02/04/2022 1004    ASSESSMENT AND PLAN:  There are no diagnoses linked to this encounter.   Patient was given the opportunity to ask questions.  Patient verbalized understanding of the plan and was able to repeat key elements of the plan. Patient was given clear instructions to go to Emergency Department or return to medical center if symptoms don't improve, worsen, or new problems develop.The patient verbalized understanding.   No orders of the defined types were placed in this encounter.    Requested Prescriptions    No prescriptions requested or ordered in this encounter    No follow-ups on file.  Camillia Herter, NP

## 2022-03-31 ENCOUNTER — Telehealth: Payer: Self-pay | Admitting: Licensed Clinical Social Worker

## 2022-03-31 NOTE — Telephone Encounter (Signed)
Scheduled appt per 6/20 referral. Pt is aware of appt date and time. Pt is aware to arrive 15 mins prior to appt time and to bring and updated insurance card. Pt is aware of appt location.   

## 2022-04-05 ENCOUNTER — Encounter: Payer: Self-pay | Admitting: Family

## 2022-04-05 ENCOUNTER — Ambulatory Visit (INDEPENDENT_AMBULATORY_CARE_PROVIDER_SITE_OTHER): Payer: Managed Care, Other (non HMO) | Admitting: Family

## 2022-04-05 VITALS — BP 130/72 | HR 100 | Temp 98.5°F | Resp 16 | Wt 228.0 lb

## 2022-04-05 DIAGNOSIS — Z7689 Persons encountering health services in other specified circumstances: Secondary | ICD-10-CM

## 2022-04-05 DIAGNOSIS — R682 Dry mouth, unspecified: Secondary | ICD-10-CM | POA: Diagnosis not present

## 2022-04-05 MED ORDER — PHENTERMINE HCL 37.5 MG PO CAPS: 37.5000 mg | ORAL_CAPSULE | ORAL | 0 refills | Status: DC

## 2022-04-06 LAB — SJOGREN'S SYNDROME ANTIBODS(SSA + SSB)
ENA SSA (RO) Ab: <0.2 AI (ref 0.0–0.9)
ENA SSB (LA) Ab: <0.2 AI (ref 0.0–0.9)

## 2022-04-14 ENCOUNTER — Encounter: Payer: Self-pay | Admitting: Family

## 2022-04-15 ENCOUNTER — Other Ambulatory Visit: Payer: Self-pay | Admitting: Family

## 2022-04-15 DIAGNOSIS — R238 Other skin changes: Secondary | ICD-10-CM

## 2022-04-15 DIAGNOSIS — L089 Local infection of the skin and subcutaneous tissue, unspecified: Secondary | ICD-10-CM

## 2022-04-15 MED ORDER — CEPHALEXIN 500 MG PO CAPS: 500.0000 mg | ORAL_CAPSULE | Freq: Four times a day (QID) | ORAL | 0 refills | Status: AC

## 2022-04-15 NOTE — Telephone Encounter (Signed)
-   Hydrochlorothiazide ordered 02/16/2022 for 120 day supply.  - Cephalexin prescribed.  - Referral to Dermatology ordered.

## 2022-04-21 ENCOUNTER — Ambulatory Visit: Payer: Managed Care, Other (non HMO) | Admitting: Physician Assistant

## 2022-04-21 ENCOUNTER — Encounter: Payer: Self-pay | Admitting: Physician Assistant

## 2022-04-21 VITALS — BP 152/92 | HR 107 | Ht 65.0 in | Wt 231.0 lb

## 2022-04-21 DIAGNOSIS — K219 Gastro-esophageal reflux disease without esophagitis: Secondary | ICD-10-CM | POA: Diagnosis not present

## 2022-04-21 DIAGNOSIS — R194 Change in bowel habit: Secondary | ICD-10-CM

## 2022-04-21 DIAGNOSIS — Z8371 Family history of colonic polyps: Secondary | ICD-10-CM

## 2022-04-21 DIAGNOSIS — R1084 Generalized abdominal pain: Secondary | ICD-10-CM | POA: Diagnosis not present

## 2022-04-21 DIAGNOSIS — Z800 Family history of malignant neoplasm of digestive organs: Secondary | ICD-10-CM

## 2022-04-21 DIAGNOSIS — R112 Nausea with vomiting, unspecified: Secondary | ICD-10-CM

## 2022-04-21 MED ORDER — PREDNISONE 50 MG PO TABS: ORAL_TABLET | ORAL | 0 refills | Status: DC

## 2022-04-21 MED ORDER — PANTOPRAZOLE SODIUM 40 MG PO TBEC: 40.0000 mg | DELAYED_RELEASE_TABLET | Freq: Every day | ORAL | 3 refills | Status: DC

## 2022-04-21 NOTE — Progress Notes (Signed)
Addendum: Reviewed and agree with assessment and management plan. Roshawnda Pecora M, MD  

## 2022-04-21 NOTE — Patient Instructions (Addendum)
If you are age 59 or younger, your body mass index should be between 19-25. Your Body mass index is 38.44 kg/m. If this is out of the aformentioned range listed, please consider follow up with your Primary Care Provider.  ________________________________________________________  The [facility] providers would like to encourage you to use [facility] to communicate with providers for non-urgent requests or questions.  Due to long hold times on the telephone, sending your provider a message by [facility] may be a faster and more efficient way to get a response.  Please allow 48 business hours for a response.  Please remember that this is for non-urgent requests.  _______________________________________________________  Ashley Mcconnell have been scheduled for a CT scan of the abdomen and pelvis at [facility], 1st floor Radiology. You are scheduled on 04/26/2022  at 8:00 am. You should arrive 15 minutes prior to your appointment time for registration.  Please pick up 2 bottles of contrast from [place] at least 3 days prior to your scan. The solution may taste better if refrigerated, but do NOT add ice or any other liquid to this solution. Shake well before drinking.   Please follow the written instructions below on the day of your exam:   1) Do not eat anything after 4:00 am (4 hours prior to your test)   2) Drink 1 bottle of contrast @ 6:00 am (2 hours prior to your exam)  Remember to shake well before drinking and do NOT pour over ice.     Drink 1 bottle of contrast @ 7:00 am (1 hour prior to your exam)   You may take any medications as prescribed with a small amount of water, if necessary. If you take any of the following medications: METFORMIN, GLUCOPHAGE, GLUCOVANCE, AVANDAMET, RIOMET, FORTAMET, [name], JANUMET, GLUMETZA or METAGLIP, you MAY be asked to HOLD this medication 48 hours AFTER the exam.   The purpose of you drinking the oral contrast is to aid in the visualization of your intestinal  tract. The contrast solution may cause some diarrhea. Depending on your individual set of symptoms, you may also receive an intravenous injection of x-ray contrast/dye. Plan on being at [name] for 45 minutes or longer, depending on the type of exam you are having performed.   If you have any questions regarding your exam or if you need to reschedule, you may call [facility] at [phone] between the hours of 8:00 am and 5:00 pm, Monday-Friday.   STOP using Omeprazole  START Pantoprazole 40 mg 1 tablet every morning  We have sent in Prednisone to take prior to your CT. You will take 1 tablet 13 hours prior to your Ct, Take 1 tablet 7 hours prior and 1 tablet 1 hour prior to your CT. You may also take Benadryle 50 mg 1 hour prior to your CT  START Miralax 1 capful in 8 ounces of juice or water.  Your provider has requested that you go to the basement level for lab work before leaving today. Press "B" on the elevator. The lab is located at the first door on the left as you exit the elevator.   You have been scheduled to follow up on May 20, 2022 at 10:00 am  Thank you for entrusting me with your care and choosing [facility].  Ellouise Newer, PA-C

## 2022-04-21 NOTE — Progress Notes (Addendum)
Chief Complaint: Right upper quadrant abdominal pain  HPI:    Ashley Mcconnell is a 61 year old Caucasian female, previously seen at [facility], who was referred to me by Camillia Herter, NP for a complaint of right upper quadrant abdominal pain.      02/04/2022 CMP with elevated glucose at 135 and otherwise normal.  Lipase and CBC normal.    Today, the patient explains that she has a lot of problems.  She starts with the fact that she has a family history of colon cancer in her sister and a family history of colon polyps in her father and brother and she believes herself.  Tells me her last colonoscopy was in 2019 with [facility] and she thinks she had polyps and is overdue for repeat.    Also explains today that she has abdominal pain which is severe sometimes a 10/10 often every day out of the week and she has been seen for this multiple times over the past year but "no one has ever told me what is going on".  Tells me she has not had any abdominal imaging recently.  Notes that her bowels often radiate from constipation to explosive diarrhea.  Tells me she will go 2 to 3 days without a bowel movement and then have a day of explosive diarrhea "without taking anything".  Often times she does try a stool softener or laxative and they sometimes seem to help but she has never taken anything on a regular basis.      Also tells me that her throat is constantly dry and feels "swollen", so much so that she feels like she "cannot breathe" at times.  Does relay a history of allergies but is not taking any medication for this currently.  Tells me every morning she wakes up she has hoarseness and a deepened voice.  She has been on Omeprazole 20 mg daily for "many many years".  Tells me they tried to increase it to 40 mg a few years back but this caused my "system to shut down".  Associated symptoms include nausea and occasional dysphagia for which she tells me she has had had a dilation in the past.    Also describes  that she has an "infection in my body I cannot get rid of".  Tells me that she gets sores on her abdomen and chest area that just seem to pop up every once in a while.  Apparently just finished a course of Keflex but tells me that the symptoms are still there.    Currently on Phentermine for weight loss.  Tells me she does not think it is working "this time around".    Denies fever, chills or weight loss.  Past Medical History:  Diagnosis Date   Cardiomyopathy [facility])    History of colon polyps    Hypertension    Phreesia 09/26/2020   Kidney stones    Migraine    Morton's neuroma    L Foot   Pulmonary embolism ([place])    Vitamin D deficiency     Past Surgical History:  Procedure Laterality Date   ABDOMINAL HYSTERECTOMY     CARDIAC SURGERY  04/2014   Catherization    CHOLECYSTECTOMY     CYSTOSCOPY/URETEROSCOPY/HOLMIUM LASER/STENT PLACEMENT Left 10/10/2018   Procedure: CYSTOSCOPY/URETEROSCOPY/HOLMIUM LASER/STENT PLACEMENT;  Surgeon: Ceasar Mons, MD;  Location: [facility];  Service: Urology;  Laterality: Left;   LITHOTRIPSY     x5   TONSILLECTOMY  1969  Current Outpatient Medications  Medication Sig Dispense Refill   atorvastatin (LIPITOR) 20 MG tablet Take 1 tablet (20 mg total) by mouth daily. 90 tablet 1   Blood Glucose Monitoring Suppl (TRUE METRIX METER) w/Device KIT Use as directed 1 kit 0   cyclobenzaprine (FLEXERIL) 5 MG tablet Take 5 mg by mouth at bedtime.     diclofenac (CATAFLAM) 50 MG tablet Take 50 mg by mouth 3 (three) times daily as needed.     glucose blood (TRUE METRIX BLOOD GLUCOSE TEST) test strip Use as instructed 100 each 12   hydrochlorothiazide (HYDRODIURIL) 12.5 MG tablet Take 1 tablet (12.5 mg total) by mouth daily. 120 tablet 0   metFORMIN (GLUCOPHAGE) 500 MG tablet Take 1 tablet (500 mg total) by mouth at bedtime. 120 tablet 0   methocarbamol (ROBAXIN) 500 MG tablet Take 500 mg by mouth 3 (three) times daily.     mupirocin ointment (BACTROBAN)  2 % Apply 1 application topically 2 (two) times daily. 22 g 0   omeprazole (PRILOSEC) 20 MG capsule TAKE 1 CAPSULE BY MOUTH EVERY DAY 90 capsule 1   ondansetron (ZOFRAN-ODT) 8 MG disintegrating tablet Take 1 tablet (8 mg total) by mouth every 8 (eight) hours as needed for nausea or vomiting. 30 tablet 1   oxybutynin (DITROPAN-XL) 10 MG 24 hr tablet TAKE 1 TABLET BY MOUTH EVERY DAY 90 tablet 1   phentermine 37.5 MG capsule Take 1 capsule (37.5 mg total) by mouth every morning. 30 capsule 0   triamcinolone ointment (KENALOG) 0.1 % Apply 1 application topically 2 (two) times daily as needed (itching). 80 g 1   TRUEplus Lancets 28G MISC Use as directed 100 each 4   Vitamin D, Ergocalciferol, (DRISDOL) 1.25 MG (50000 UNIT) CAPS capsule Take 1 capsule (50,000 Units total) by mouth every 7 (seven) days. 4 capsule 3   No current facility-administered medications for this visit.    Allergies as of 04/21/2022 - Review Complete 02/04/2022  Allergen Reaction Noted   Bactrim [sulfamethoxazole-trimethoprim]  11/16/2021   Fish allergy Rash 06/02/2013   Iodinated contrast media Other (See Comments) 02/06/2013   Shellfish allergy Itching and Rash 09/26/2020    Family History  Problem Relation Age of Onset   Breast cancer Mother    Stroke Father    Heart Problems Father        heart bypass   Alzheimer's disease Paternal Aunt    Alzheimer's disease Paternal Uncle    Heart Problems Paternal Grandmother        enlarged heart   Alzheimer's disease Paternal Aunt    Alzheimer's disease Paternal Aunt    Cancer Sister        colorectal    Social History   Socioeconomic History   Marital status: Single    Spouse name: Not on file   Number of children: 3   Years of education: College   Highest education level: Not on file  Occupational History    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   Tobacco Use   Smoking status: Former    Packs/day: 0.20    Years: 2.00    Total pack years: 0.40     Types: Cigarettes    Quit date: 07/24/1986    Years since quitting: 35.7   Smokeless tobacco: Never   Tobacco comments:    social smoker  Substance and Sexual Activity   Alcohol use: Yes    Alcohol/week: 0.0 standard drinks of alcohol    Comment:  1 drink on New Year's Eve   Drug use: No   Sexual activity: Not Currently    Birth control/protection: Surgical  Other Topics Concern   Not on file  Social History Narrative   Patient lives at home with her family.   Caffeine Use: 1 cup of tea two times weekly   Social Determinants of Health   Financial Resource Strain: Not on file  Food Insecurity: Not on file  Transportation Needs: Not on file  Physical Activity: Not on file  Stress: Not on file  Social Connections: Not on file  Intimate Partner Violence: Not on file    Review of Systems:    Constitutional: No weight loss, fever or chills Skin: No rash  Cardiovascular: No chest pain Respiratory: No SOB Gastrointestinal: See HPI and otherwise negative Genitourinary: No dysuria  Neurological: No headache, dizziness or syncope Musculoskeletal: No new muscle or joint pain Hematologic: No bleeding  Psychiatric: No history of depression or anxiety   Physical Exam:  Vital signs: BP (!) 152/92   Pulse (!) 107   Ht '5\' 5"'  (1.651 m)   Wt 231 lb (104.8 kg)   BMI 38.44 kg/m    Constitutional:   Pleasant overweight Caucasian female appears to be in NAD, Well developed, Well nourished, alert and cooperative Head:  Normocephalic and atraumatic. Eyes:   PEERL, EOMI. No icterus. Conjunctiva pink. Ears:  Normal auditory acuity. Neck:  Supple Throat: Oral cavity and pharynx without inflammation, swelling or lesion.  Respiratory: Respirations even and unlabored. Lungs clear to auscultation bilaterally.   No wheezes, crackles, or rhonchi.  Cardiovascular: Normal S1, S2. No MRG. Regular rate and rhythm. No peripheral edema, cyanosis or pallor.  Gastrointestinal:  Soft, nondistended,  moderate generalized TTP with marked TTP in the epigastrium and involuntary guarding, normal bowel sounds. No appreciable masses or hepatomegaly. Rectal:  Not performed.  Msk:  Symmetrical without gross deformities. Without edema, no deformity or joint abnormality.  Neurologic:  Alert and  oriented x4;  grossly normal neurologically.  Skin:   Dry and intact without significant lesions or rashes. Psychiatric:  Demonstrates good judgement and reason without abnormal affect or behaviors.  MOST RECENT LABS AND IMAGING: CBC    Component Value Date/Time   WBC 9.2 02/04/2022 1004   RBC 4.80 02/04/2022 1004   HGB 13.7 02/04/2022 1004   HGB 14.3 11/16/2021 1616   HCT 40.2 02/04/2022 1004   HCT 41.8 11/16/2021 1616   PLT 299 02/04/2022 1004   PLT 388 11/16/2021 1616   MCV 83.8 02/04/2022 1004   MCV 83 11/16/2021 1616   MCH 28.5 02/04/2022 1004   MCHC 34.1 02/04/2022 1004   RDW 12.5 02/04/2022 1004   RDW 12.7 11/16/2021 1616   LYMPHSABS 2.2 02/04/2022 1004   MONOABS 0.6 02/04/2022 1004   EOSABS 0.1 02/04/2022 1004   BASOSABS 0.0 02/04/2022 1004    CMP     Component Value Date/Time   NA 139 02/04/2022 1004   NA 142 11/16/2021 1616   K 4.0 02/04/2022 1004   CL 105 02/04/2022 1004   CO2 26 02/04/2022 1004   GLUCOSE 135 (H) 02/04/2022 1004   BUN 15 02/04/2022 1004   BUN 17 11/16/2021 1616   CREATININE 0.68 02/04/2022 1004   CALCIUM 9.2 02/04/2022 1004   PROT 7.2 02/04/2022 1004   PROT 7.8 11/16/2021 1616   ALBUMIN 3.9 02/04/2022 1004   ALBUMIN 4.8 11/16/2021 1616   AST 17 02/04/2022 1004   ALT 16 02/04/2022 1004  ALKPHOS 61 02/04/2022 1004   BILITOT 0.4 02/04/2022 1004   BILITOT 0.3 11/16/2021 1616   GFRNONAA >60 02/04/2022 1004   GFRAA 99 10/24/2020 1616    Assessment: 1.  Family history of colon cancer and polyps: Colon cancer in her sister, patient's last colonoscopy reported in 2019, family history of polyps 2.  Generalized abdominal pain: Possibly with constipation  and reflux, seems to been fairly constant over the past year but with no imaging; consider gastritis +/- constipation 3.  Nausea and GERD: Also with symptoms of dysphagia previously dilated, patient on Omeprazole 20 mg daily for the symptoms which is no longer working; likely chronic gastritis 4.  Change in bowel habits: Towards constipation over the past year, patient is on Phentermine  Plan: 1.  Ordered CT of the abdomen pelvis with contrast for further evaluation of change in bowel habits and generalized abdominal pain.  Patient does have a contrast allergy and steroids were ordered. 2.  Stop Omeprazole.  Start Pantoprazole 40 mg every morning, 30-60 minutes before breakfast #30 with 5 refills. 3.  Discussed that her explosive diarrhea could just be overflow constipation as this seems to be her constant.  Would recommend that she use MiraLAX on a daily basis. 4.  It sounds like patient is due for a colonoscopy.  Would recommend that we do an EGD at the same time given all of her symptoms.  We will try and get records from [facility] or [facility] in regards to last colonoscopy and then we will likely schedule her for both.  If we schedule these procedures she will need to hold her Phentermine for 10 days. 5.  Patient to follow in clinic with me in 4 to 6 weeks or sooner if necessary.  Patient is assigned to Dr. Hilarie Fredrickson this morning.  Ellouise Newer, PA-C [facility] 04/21/2022, 8:27 AM  Cc: Camillia Herter, NP   Addendum: 04/22/2022 10:26 AM  Received records from [facility].  08/09/2012 colonoscopy for colon cancer screening and a family history of colon polyps.  Exam showed internal hemorrhoids and was otherwise normal.  Repeat recommended in 5 years.  Per our records I think patient saw [facility] after that.  We will try and get records from there as well.  Ellouise Newer, PA-C  Addendum: 05/03/2022 9:43 AM  Received records from [facility]  02/22/2018 colonoscopy for personal history of colon polyps and a family history of colon cancer as well as colon polyps with mild diverticulosis of the left side of the colon and internal hemorrhoids.  Otherwise normal.  Repeat recommended in 5 years.  Will need to discuss that colonoscopy is not due for screening purposes until 2024, but if symptoms continue could certainly discuss EGD and colonoscopy together.  Ellouise Newer, PA-C

## 2022-04-22 ENCOUNTER — Other Ambulatory Visit: Payer: Self-pay

## 2022-04-22 DIAGNOSIS — I10 Essential (primary) hypertension: Secondary | ICD-10-CM

## 2022-04-22 MED ORDER — HYDROCHLOROTHIAZIDE 12.5 MG PO TABS: 12.5000 mg | ORAL_TABLET | Freq: Every day | ORAL | 0 refills | Status: DC

## 2022-04-26 ENCOUNTER — Ambulatory Visit (HOSPITAL_COMMUNITY): Payer: Managed Care, Other (non HMO)

## 2022-04-26 ENCOUNTER — Other Ambulatory Visit: Payer: Self-pay | Admitting: Family

## 2022-04-26 ENCOUNTER — Encounter (HOSPITAL_COMMUNITY): Payer: Self-pay

## 2022-04-26 DIAGNOSIS — E785 Hyperlipidemia, unspecified: Secondary | ICD-10-CM

## 2022-04-27 NOTE — Telephone Encounter (Signed)
Refilled 02/16/2022 #90 1 refill. Requested Prescriptions  Pending Prescriptions Disp Refills  . atorvastatin (LIPITOR) 20 MG tablet [Pharmacy Med Name: ATORVASTATIN 20 MG TABLET] 90 tablet 1    Sig: TAKE 1 TABLET BY MOUTH EVERY DAY     Cardiovascular:  Antilipid - Statins Failed - 04/26/2022  5:52 PM      Failed - Lipid Panel in normal range within the last 12 months    Cholesterol, Total  Date Value Ref Range Status  02/16/2022 132 100 - 199 mg/dL Final   LDL Chol Calc (NIH)  Date Value Ref Range Status  02/16/2022 64 0 - 99 mg/dL Final   HDL  Date Value Ref Range Status  02/16/2022 50 >39 mg/dL Final   Triglycerides  Date Value Ref Range Status  02/16/2022 99 0 - 149 mg/dL Final         Passed - Patient is not pregnant      Passed - Valid encounter within last 12 months    Recent Outpatient Visits          3 weeks ago Encounter for weight management   Primary Care at [facility], Amy J, NP   2 months ago Essential (primary) hypertension   Primary Care at [facility], Amy J, NP   5 months ago Essential (primary) hypertension   Primary Care at [facility], Amy J, NP   7 months ago Abscess of right breast   Primary Care at [facility], Amy J, NP   8 months ago Lump in lower inner quadrant of right breast   Primary Care at [facility], Kriste Basque, NP      Future Appointments            In 1 month Camillia Herter, NP Primary Care at [facility]

## 2022-04-28 ENCOUNTER — Other Ambulatory Visit: Payer: Self-pay

## 2022-04-28 DIAGNOSIS — E785 Hyperlipidemia, unspecified: Secondary | ICD-10-CM

## 2022-04-28 MED ORDER — ATORVASTATIN CALCIUM 20 MG PO TABS: 20.0000 mg | ORAL_TABLET | Freq: Every day | ORAL | 0 refills | Status: DC

## 2022-04-28 NOTE — Telephone Encounter (Signed)
Spoke w/pharmacy last refill 01/23/22 for 90 day supply refill due. Atorvastatin refilled

## 2022-05-10 ENCOUNTER — Encounter: Payer: Self-pay | Admitting: Licensed Clinical Social Worker

## 2022-05-10 ENCOUNTER — Inpatient Hospital Stay: Payer: Managed Care, Other (non HMO) | Attending: Genetic Counselor | Admitting: Licensed Clinical Social Worker

## 2022-05-10 ENCOUNTER — Other Ambulatory Visit: Payer: Self-pay

## 2022-05-10 ENCOUNTER — Inpatient Hospital Stay: Payer: Managed Care, Other (non HMO)

## 2022-05-10 DIAGNOSIS — Z800 Family history of malignant neoplasm of digestive organs: Secondary | ICD-10-CM | POA: Diagnosis not present

## 2022-05-10 DIAGNOSIS — Z803 Family history of malignant neoplasm of breast: Secondary | ICD-10-CM | POA: Diagnosis not present

## 2022-05-10 DIAGNOSIS — Z808 Family history of malignant neoplasm of other organs or systems: Secondary | ICD-10-CM | POA: Diagnosis not present

## 2022-05-10 LAB — GENETIC SCREENING ORDER

## 2022-05-10 NOTE — Progress Notes (Signed)
REFERRING PROVIDER: Jovita Kussmaul, MD [address],  [place] [id number]  PRIMARY PROVIDER:  Camillia Herter, NP  PRIMARY REASON FOR VISIT:  1. Family history of breast cancer   2. Family history of colon cancer   3. Family history of melanoma      HISTORY OF PRESENT ILLNESS:   Ms. Kneece, a 61 y.o. female, was seen for a [facility] cancer genetics consultation at the request of Dr. Marlou Starks due to a family history of cancer.  Ms. Wiesman presents to clinic today to discuss the possibility of a hereditary predisposition to cancer, genetic testing, and to further clarify her future cancer risks, as well as potential cancer risks for family members.   CANCER HISTORY:  Ms. Radwan is a 61 y.o. female with no personal history of cancer.    RISK FACTORS:  Menarche was at age 77.  First live birth at age 62.  OCP use for: short time Ovaries intact: no Hysterectomy: yes Menopausal status: postmenopausal.  HRT use: 0 years. Colonoscopy: yes;  polyps . Mammogram within the last year: yes. Number of breast biopsies: 1. Past Medical History:  Diagnosis Date   Cardiomyopathy ([place])    Diabetes ([place])    H/O blood clots    History of colon polyps    Hypertension    Phreesia 09/26/2020   Kidney stones    Migraine    Morton's neuroma    L Foot   Pulmonary embolism ([place])    Status post dilation of esophageal narrowing    Vitamin D deficiency     Past Surgical History:  Procedure Laterality Date   ABDOMINAL HYSTERECTOMY     CARDIAC SURGERY  04/2014   Catherization    CHOLECYSTECTOMY     CYSTOSCOPY/URETEROSCOPY/HOLMIUM LASER/STENT PLACEMENT Left 10/10/2018   Procedure: CYSTOSCOPY/URETEROSCOPY/HOLMIUM LASER/STENT PLACEMENT;  Surgeon: Ceasar Mons, MD;  Location: [facility];  Service: Urology;  Laterality: Left;   LITHOTRIPSY     x5   TONSILLECTOMY  1969    FAMILY HISTORY:  We obtained a detailed, 4-generation family history.  Significant diagnoses are  listed below: Family History  Problem Relation Age of Onset   Breast cancer Mother        limited info   Stroke Father    Heart Problems Father        heart bypass   Melanoma Father        x2-3   Cancer Sister 43       colorectal   Alzheimer's disease Paternal Aunt    Alzheimer's disease Paternal Aunt    Alzheimer's disease Paternal Aunt    Cancer Paternal Aunt        unk type   Alzheimer's disease Paternal Uncle    Heart Problems Paternal Grandmother        enlarged heart   Testicular cancer Son    Stomach cancer Neg Hx    Colon cancer Neg Hx    Esophageal cancer Neg Hx    Ms. Sandeen has 1 son, 2 daughters. Her son has had testicular cancer. Ms. Bennison has 3 full brothers, 2 full sisters, 1 maternal half sister. One of her full sisters died of colon cancer at 47.   Ms. Crotteau biological mother had breast cancer, unknown age of dx. No other information about this side of the family.  Ms. Dimascio father has had melanoma 2-3 times and is living at 36. A paternal aunt had cancer, unknown type. Limited information about  this side of the family.  Ms. Valiente is unaware of previous family history of genetic testing for hereditary cancer risks. There is no reported Ashkenazi Jewish ancestry. There is no known consanguinity.    GENETIC COUNSELING ASSESSMENT: Ms. Bieker is a 61 y.o. female with a family history of cancer which is somewhat suggestive of a hereditary cancer syndrome and predisposition to cancer. We, therefore, discussed and recommended the following at today's visit.   DISCUSSION: We discussed that approximately 10% of  cancer is hereditary. Most cases of hereditary breast cancer are associated with BRCA1/BRCA2 genes, although there are other genes associated with hereditary cancer as well such as CHEK2 which increases risk for both breast and colon. Cancers and risks are gene specific. We discussed that testing is beneficial for several reasons including knowing  about cancer risks, identifying potential screening and risk-reduction options that may be appropriate, and to understand if other family members could be at risk for cancer and allow them to undergo genetic testing.   We reviewed the characteristics, features and inheritance patterns of hereditary cancer syndromes. We also discussed genetic testing, including the appropriate family members to test, the process of testing, insurance coverage and turn-around-time for results. We discussed the implications of a negative, positive and/or variant of uncertain significant result. Although she does not quite meet criteria, she has limited information about both sides of her family and therefore testing would be reasonable. We recommended Ms. Stuckert pursue genetic testing for the [facility] CancerNext-Expanded+RNA gene panel.   Based on Ms. Hasz's family history of cancer, she does not meet medical criteria for genetic testing. She may have an out of pocket cost.   PLAN: After considering the risks, benefits, and limitations, Ms. Serrao provided informed consent to pursue genetic testing and the blood sample was sent to [facility] for analysis of the CancerNext-Expanded+RNA panel. Results should be available within approximately 2-3 weeks' time, at which point they will be disclosed by telephone to Ms. Warnick, as will any additional recommendations warranted by these results. Ms. Ezelle will receive a summary of her genetic counseling visit and a copy of her results once available. This information will also be available in Epic.   Ms. Irion questions were answered to her satisfaction today. Our contact information was provided should additional questions or concerns arise. Thank you for the referral and allowing [place] to share in the care of your patient.   Faith Rogue, MS, [facility] Genetic Counselor [email or web address] Phone: [phone]  The patient was seen for a total of 30 minutes  in face-to-face genetic counseling.  Dr. Grayland Ormond was available for discussion regarding this case.   _______________________________________________________________________ For Office Staff:  Number of people involved in session: 1 Was an Intern/ student involved with case: no

## 2022-05-14 ENCOUNTER — Encounter: Payer: Self-pay | Admitting: Family

## 2022-05-24 ENCOUNTER — Encounter: Payer: Self-pay | Admitting: Physician Assistant

## 2022-05-24 ENCOUNTER — Ambulatory Visit: Payer: Managed Care, Other (non HMO) | Admitting: Physician Assistant

## 2022-05-24 VITALS — BP 138/82 | HR 111 | Ht 65.0 in | Wt 228.0 lb

## 2022-05-24 DIAGNOSIS — R630 Anorexia: Secondary | ICD-10-CM

## 2022-05-24 DIAGNOSIS — R1012 Left upper quadrant pain: Secondary | ICD-10-CM | POA: Diagnosis not present

## 2022-05-24 DIAGNOSIS — K219 Gastro-esophageal reflux disease without esophagitis: Secondary | ICD-10-CM | POA: Diagnosis not present

## 2022-05-24 DIAGNOSIS — R1013 Epigastric pain: Secondary | ICD-10-CM | POA: Diagnosis not present

## 2022-05-24 NOTE — Progress Notes (Signed)
Addendum: Reviewed and agree with assessment and management plan. Lamount Bankson M, MD  

## 2022-05-24 NOTE — Patient Instructions (Signed)
Increase Miralax to 1 capful twice daily in 8 ounces of liquid.   You have been scheduled for an endoscopy. Please follow written instructions given to you at your visit today. If you use inhalers (even only as needed), please bring them with you on the day of your procedure.  _______________________________________________________  If you are age 59 or older, your body mass index should be between 23-30. Your Body mass index is 37.94 kg/m. If this is out of the aforementioned range listed, please consider follow up with your Primary Care Provider.  If you are age 11 or younger, your body mass index should be between 19-25. Your Body mass index is 37.94 kg/m. If this is out of the aformentioned range listed, please consider follow up with your Primary Care Provider.   ________________________________________________________  The [facility] providers would like to encourage you to use [facility] to communicate with providers for non-urgent requests or questions.  Due to long hold times on the telephone, sending your provider a message by [facility] may be a faster and more efficient way to get a response.  Please allow 48 business hours for a response.  Please remember that this is for non-urgent requests.  _______________________________________________________

## 2022-05-24 NOTE — Progress Notes (Signed)
Chief Complaint: Follow-up change in bowel habits and right upper quadrant abdominal pain  HPI:    Ashley Mcconnell is a 61 year old female with a past medical history as listed below, who returns to clinic today for follow-up of right upper quadrant abdominal pain and change in bowel habits.    02/19/2018 colonoscopy for history of polyps with only mild diverticulosis of the left side of the colon and internal hemorrhoids.  Repeat recommended in 5 years.    02/04/2022 CMP with an elevated glucose of 135 and otherwise normal.  Lipase and CBC normal.    04/21/2022 patient seen in clinic by me and at that time discussed that she had a lot of problems.  Discussed a family history of colon cancer in her sister and a family history of colon polyps in her father and brother.  Described a previous colonoscopy in 2019 with [facility].  Also discussed severe sometimes a 10 out of 10 abdominal pain.  Described radiating from constipation to explosive diarrhea.  Also describes a dry throat.  At that time ordered a CT of the abdomen pelvis with contrast for further evaluation.  We changed her to Pantoprazole 40 mg every morning.  Recommend she use your MiraLAX on a daily basis.  Discussed that she would likely need an EGD and colonoscopy.  She would need to hold her Phentermine for 10 days prior to procedure.    05/19/2022 CT of abdomen pelvis with no explanation for patient's symptoms and a nonobstructing right renal calculi.    Today, patient explains that not a whole lot has changed with her GI symptoms.  She did try using the MiraLAX but 1 dose a day did not do much so over the weekend she started taking 2 doses a day which did result in a good bowel movement.  Tells me that this is not new for her and has been an ongoing problem.    Also discusses continued dry mouth and a decrease in appetite as well as reflux regardless of her changed to Pantoprazole 40 mg every morning.  Continued epigastric and left upper quadrant  pain.    Denies fever, chills, weight loss or blood in her stool.  Past Medical History:  Diagnosis Date   Cardiomyopathy ([place])    Diabetes ([place])    H/O blood clots    History of colon polyps    Hypertension    Phreesia 09/26/2020   Kidney stones    Migraine    Morton's neuroma    L Foot   Pulmonary embolism ([place])    Status post dilation of esophageal narrowing    Vitamin D deficiency     Past Surgical History:  Procedure Laterality Date   ABDOMINAL HYSTERECTOMY     CARDIAC SURGERY  04/2014   Catherization    CHOLECYSTECTOMY     CYSTOSCOPY/URETEROSCOPY/HOLMIUM LASER/STENT PLACEMENT Left 10/10/2018   Procedure: CYSTOSCOPY/URETEROSCOPY/HOLMIUM LASER/STENT PLACEMENT;  Surgeon: Ceasar Mons, MD;  Location: [facility];  Service: Urology;  Laterality: Left;   LITHOTRIPSY     x5   TONSILLECTOMY  1969    Current Outpatient Medications  Medication Sig Dispense Refill   atorvastatin (LIPITOR) 20 MG tablet Take 1 tablet (20 mg total) by mouth daily. 90 tablet 0   Blood Glucose Monitoring Suppl (TRUE METRIX METER) w/Device KIT Use as directed 1 kit 0   cyclobenzaprine (FLEXERIL) 5 MG tablet Take 5 mg by mouth at bedtime.     diclofenac (CATAFLAM) 50 MG tablet Take 50  mg by mouth 3 (three) times daily as needed. (Patient not taking: Reported on 04/21/2022)     glucose blood (TRUE METRIX BLOOD GLUCOSE TEST) test strip Use as instructed 100 each 12   hydrochlorothiazide (HYDRODIURIL) 12.5 MG tablet Take 1 tablet (12.5 mg total) by mouth daily. 90 tablet 0   metFORMIN (GLUCOPHAGE) 500 MG tablet Take 1 tablet (500 mg total) by mouth at bedtime. 120 tablet 0   methocarbamol (ROBAXIN) 500 MG tablet Take 500 mg by mouth 3 (three) times daily.     mupirocin ointment (BACTROBAN) 2 % Apply 1 application topically 2 (two) times daily. 22 g 0   ondansetron (ZOFRAN-ODT) 8 MG disintegrating tablet Take 1 tablet (8 mg total) by mouth every 8 (eight) hours as needed for nausea or vomiting. 30  tablet 1   oxybutynin (DITROPAN-XL) 10 MG 24 hr tablet TAKE 1 TABLET BY MOUTH EVERY DAY 90 tablet 1   pantoprazole (PROTONIX) 40 MG tablet Take 1 tablet (40 mg total) by mouth daily. 30 tablet 3   phentermine 37.5 MG capsule Take 1 capsule (37.5 mg total) by mouth every morning. 30 capsule 0   predniSONE (DELTASONE) 50 MG tablet Take 1 tablet 13 hours prior to CT, take 1 tablet 7 hours prior to CT and take 1 tablet 1 hour prior to CT 3 tablet 0   triamcinolone ointment (KENALOG) 0.1 % Apply 1 application topically 2 (two) times daily as needed (itching). 80 g 1   TRUEplus Lancets 28G MISC Use as directed 100 each 4   Vitamin D, Ergocalciferol, (DRISDOL) 1.25 MG (50000 UNIT) CAPS capsule Take 1 capsule (50,000 Units total) by mouth every 7 (seven) days. (Patient not taking: Reported on 04/21/2022) 4 capsule 3   No current facility-administered medications for this visit.    Allergies as of 05/24/2022 - Review Complete 04/21/2022  Allergen Reaction Noted   Bactrim [sulfamethoxazole-trimethoprim]  11/16/2021   Fish allergy Rash 06/02/2013   Iodinated contrast media Other (See Comments) 02/06/2013   Shellfish allergy Itching and Rash 09/26/2020    Family History  Problem Relation Age of Onset   Breast cancer Mother        limited info   Stroke Father    Heart Problems Father        heart bypass   Melanoma Father        x2-3   Cancer Sister 70       colorectal   Alzheimer's disease Paternal Aunt    Alzheimer's disease Paternal Aunt    Alzheimer's disease Paternal Aunt    Cancer Paternal Aunt        unk type   Alzheimer's disease Paternal Uncle    Heart Problems Paternal Grandmother        enlarged heart   Testicular cancer Son    Stomach cancer Neg Hx    Colon cancer Neg Hx    Esophageal cancer Neg Hx     Social History   Socioeconomic History   Marital status: Single    Spouse name: Not on file   Number of children: 3   Years of education: College   Highest education  level: Not on file  Occupational History    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    Occupation: Mudlogger  Tobacco Use   Smoking status: Former    Packs/day: 0.20    Years: 2.00    Total pack years: 0.40    Types: Cigarettes  Quit date: 07/24/1986    Years since quitting: 35.8   Smokeless tobacco: Never   Tobacco comments:    social smoker  Vaping Use   Vaping Use: Never used  Substance and Sexual Activity   Alcohol use: Yes    Alcohol/week: 0.0 standard drinks of alcohol    Comment: 1 drink on New Year's Eve   Drug use: No   Sexual activity: Not Currently    Birth control/protection: Surgical  Other Topics Concern   Not on file  Social History Narrative   Patient lives at home with her family.   Caffeine Use: 1 cup of tea two times weekly   Social Determinants of Health   Financial Resource Strain: Not on file  Food Insecurity: Not on file  Transportation Needs: Not on file  Physical Activity: Not on file  Stress: Not on file  Social Connections: Not on file  Intimate Partner Violence: Not on file    Review of Systems:    Constitutional: No weight loss, fever or chills Cardiovascular: No chest pain Respiratory: No SOB Gastrointestinal: See HPI and otherwise negative   Physical Exam:  Vital signs: BP 138/82   Pulse (!) 111   Ht $R'5\' 5"'yG$  (1.651 m)   Wt 228 lb (103.4 kg)   BMI 37.94 kg/m    Constitutional:   Pleasant overweight Caucasian female appears to be in NAD, Well developed, Well nourished, alert and cooperative Respiratory: Respirations even and unlabored. Lungs clear to auscultation bilaterally.   No wheezes, crackles, or rhonchi.  Cardiovascular: Normal S1, S2. No MRG. Regular rate and rhythm. No peripheral edema, cyanosis or pallor.  Gastrointestinal:  Soft, nondistended,mild generalized ttp . No rebound or guarding. Normal bowel sounds. No appreciable masses or hepatomegaly. Rectal:  Not performed.  Psychiatric:  Demonstrates good judgement and reason without abnormal affect or behaviors.  RELEVANT LABS AND IMAGING: CBC    Component Value Date/Time   WBC 9.2 02/04/2022 1004   RBC 4.80 02/04/2022 1004   HGB 13.7 02/04/2022 1004   HGB 14.3 11/16/2021 1616   HCT 40.2 02/04/2022 1004   HCT 41.8 11/16/2021 1616   PLT 299 02/04/2022 1004   PLT 388 11/16/2021 1616   MCV 83.8 02/04/2022 1004   MCV 83 11/16/2021 1616   MCH 28.5 02/04/2022 1004   MCHC 34.1 02/04/2022 1004   RDW 12.5 02/04/2022 1004   RDW 12.7 11/16/2021 1616   LYMPHSABS 2.2 02/04/2022 1004   MONOABS 0.6 02/04/2022 1004   EOSABS 0.1 02/04/2022 1004   BASOSABS 0.0 02/04/2022 1004    CMP     Component Value Date/Time   NA 139 02/04/2022 1004   NA 142 11/16/2021 1616   K 4.0 02/04/2022 1004   CL 105 02/04/2022 1004   CO2 26 02/04/2022 1004   GLUCOSE 135 (H) 02/04/2022 1004   BUN 15 02/04/2022 1004   BUN 17 11/16/2021 1616   CREATININE 0.68 02/04/2022 1004   CALCIUM 9.2 02/04/2022 1004   PROT 7.2 02/04/2022 1004   PROT 7.8 11/16/2021 1616   ALBUMIN 3.9 02/04/2022 1004   ALBUMIN 4.8 11/16/2021 1616   AST 17 02/04/2022 1004   ALT 16 02/04/2022 1004   ALKPHOS 61 02/04/2022 1004   BILITOT 0.4 02/04/2022 1004   BILITOT 0.3 11/16/2021 1616   GFRNONAA >60 02/04/2022 1004   GFRAA 99 10/24/2020 1616    Assessment: 1.  Left upper quadrant/epigastric pain: likely gastritis, recent CT with no etiology 2.  GERD: Mild to moderate control on  Pantoprazole 40 mg daily but ongoing pain as above 3.  Decrease in appetite: Consider relation to gastritis versus constipation 4.  Constipation: Chronic for the patient with times of explosive diarrhea thought to be from overflow, some better with MiraLAX twice a day  Plan: 1.  At this point discussed that she should increase her MiraLAX to 2 doses a day.  Explained that she could titrate this up to 4 times a day if needed for more regular bowel movements.  This radiation between  constipation and explosive diarrhea is not new for her.  She is not technically due for colonoscopy for surveillance until next year.  Would wait until then unless something changes. 2.  Continue Pantoprazole 40 mg daily for now. 3.  Scheduled patient for an EGD for further evaluation of ongoing throat soreness, decreased appetite, reflux and epigastric pain.  This is scheduled with Dr. Hilarie Fredrickson in the [facility]. Patient is appropriate for endoscopic procedure(s) in the ambulatory ([place]) setting.  4.  Of note patient is no longer taking Phentermine. 5.  Patient to follow in clinic per recommendations from Dr. Hilarie Fredrickson after time of procedure.  Ashley Newer, PA-C [facility] 05/24/2022, 10:03 AM  Cc: Camillia Herter, NP

## 2022-06-02 ENCOUNTER — Encounter: Payer: Self-pay | Admitting: Licensed Clinical Social Worker

## 2022-06-02 ENCOUNTER — Telehealth: Payer: Self-pay | Admitting: Licensed Clinical Social Worker

## 2022-06-02 ENCOUNTER — Ambulatory Visit: Payer: Self-pay | Admitting: Licensed Clinical Social Worker

## 2022-06-02 DIAGNOSIS — Z1379 Encounter for other screening for genetic and chromosomal anomalies: Secondary | ICD-10-CM | POA: Insufficient documentation

## 2022-06-02 NOTE — Progress Notes (Signed)
HPI:   Ashley Mcconnell was previously seen in the [place] clinic due to a family history of cancer and concerns regarding a hereditary predisposition to cancer. Please refer to our prior cancer genetics clinic note for more information regarding our discussion, assessment and recommendations, at the time. Ashley Mcconnell recent genetic test results were disclosed to Ashley Mcconnell, as were recommendations warranted by these results. These results and recommendations are discussed in more detail below.  CANCER HISTORY:  Oncology History   No history exists.    FAMILY HISTORY:  We obtained a detailed, 4-generation family history.  Significant diagnoses are listed below: Family History  Problem Relation Age of Onset   Breast cancer Mother        limited info   Stroke Father    Heart Problems Father        heart bypass   Melanoma Father        x2-3   Cancer Sister 53       colorectal   Alzheimer's disease Paternal Aunt    Alzheimer's disease Paternal Aunt    Alzheimer's disease Paternal Aunt    Cancer Paternal Aunt        unk type   Alzheimer's disease Paternal Uncle    Heart Problems Paternal Grandmother        enlarged heart   Testicular cancer Son    Stomach cancer Neg Hx    Colon cancer Neg Hx    Esophageal cancer Neg Hx     Ashley Mcconnell has 1 son, 2 daughters. Ashley Mcconnell son has had testicular cancer. Ashley Mcconnell has 3 full brothers, 2 full sisters, 1 maternal half sister. One of Ashley Mcconnell full sisters died of colon cancer at 35.    Ashley Mcconnell biological mother had breast cancer, unknown age of dx. No other information about this side of the family.   Ashley Mcconnell father has had melanoma 2-3 times and is living at 38. A paternal aunt had cancer, unknown type. Limited information about this side of the family.   Ashley Mcconnell is unaware of previous family history of genetic testing for hereditary cancer risks. There is no reported Ashkenazi Jewish ancestry. There is no known  consanguinity.     GENETIC TEST RESULTS:  The [facility] CancerNext-Expanded+RNA Panel found no pathogenic mutations.   The CancerNext-Expanded + RNAinsight gene panel offered by [employer] and includes sequencing and rearrangement analysis for the following 77 genes: IP, ALK, APC*, ATM*, AXIN2, BAP1, BARD1, BLM, BMPR1A, BRCA1*, BRCA2*, BRIP1*, CDC73, CDH1*,CDK4, CDKN1B, CDKN2A, CHEK2*, CTNNA1, DICER1, FANCC, FH, FLCN, GALNT12, KIF1B, LZTR1, MAX, MEN1, MET, MLH1*, MSH2*, MSH3, MSH6*, MUTYH*, NBN, NF1*, NF2, NTHL1, PALB2*, PHOX2B, PMS2*, POT1, PRKAR1A, PTCH1, PTEN*, RAD51C*, RAD51D*,RB1, RECQL, RET, SDHA, SDHAF2, SDHB, SDHC, SDHD, SMAD4, SMARCA4, SMARCB1, SMARCE1, STK11, SUFU, TMEM127, TP53*,TSC1, TSC2, VHL and XRCC2 (sequencing and deletion/duplication); EGFR, EGLN1, HOXB13, KIT, MITF, PDGFRA, POLD1 and POLE (sequencing only); EPCAM and GREM1 (deletion/duplication only).   The test report has been scanned into EPIC and is located under the Molecular Pathology section of the Results Review tab.  A portion of the result report is included below for reference. Genetic testing reported out on 05/30/2022.      Even though a pathogenic variant was not identified, possible explanations for the cancer in the family may include: There may be no hereditary risk for cancer in the family. The cancers in Ashley Mcconnell family may be sporadic/familial or due to other genetic and environmental factors. There may be a gene mutation  in one of these genes that current testing methods cannot detect but that chance is small. There could be another gene that has not yet been discovered, or that we have not yet tested, that is responsible for the cancer diagnoses in the family.  It is also possible there is a hereditary cause for the cancer in the family that Ashley Mcconnell did not inherit.  Therefore, it is important to remain in touch with cancer genetics in the future so that we can continue to offer Ashley Mcconnell the most up to  date genetic testing.   ADDITIONAL GENETIC TESTING:  We discussed with Ashley Mcconnell that Ashley Mcconnell genetic testing was fairly extensive.  If there are additional relevant genes identified to increase cancer risk that can be analyzed in the future, we would be happy to discuss and coordinate this testing at that time.    CANCER SCREENING RECOMMENDATIONS:  Ashley Mcconnell test result is considered negative (normal).  This means that we have not identified a hereditary cause for Ashley Mcconnell family history of cancer at this time.   An individual's cancer risk and medical management are not determined by genetic test results alone. Overall cancer risk assessment incorporates additional factors, including personal medical history, family history, and any available genetic information that may result in a personalized plan for cancer prevention and surveillance. Therefore, it is recommended she continue to follow the cancer management and screening guidelines provided by Ashley Mcconnell primary healthcare provider.  Based on the reported personal and family history, specific cancer screenings for Ashley Mcconnell and Ashley Mcconnell family include:   Breast Cancer Screening:  The Tyrer-Cuzick model is one of multiple prediction models developed to estimate an individual's lifetime risk of developing breast cancer. The Tyrer-Cuzick model is endorsed by the [employer]  (NCCN). This model includes many risk factors such as family history, endogenous estrogen exposure, and benign breast disease. The calculation is highly-dependent on the accuracy of clinical data provided by the patient and can change over time. The Tyrer-Cuzick model may be repeated to reflect new information in Ashley Mcconnell personal or family history in the future.    Ashley Mcconnell Tyrer-Cuzick risk score is 12.7%.   RECOMMENDATIONS FOR FAMILY MEMBERS:   Since she did not inherit a identifiable mutation in a cancer predisposition gene included on this panel, Ashley Mcconnell  Mcconnell could not have inherited a known mutation from Ashley Mcconnell in one of these genes. Individuals in this family might be at some increased risk of developing cancer, over the general population risk, due to the family history of cancer.  Individuals in the family should notify their providers of the family history of cancer. We recommend women in this family have a yearly mammogram beginning at age 96, or 86 years younger than the earliest onset of cancer, an annual clinical breast exam, and perform monthly breast self-exams.  Family members should have colonoscopies by at age 34, or earlier, as recommended by their providers.  FOLLOW-UP:  Lastly, we discussed with Ashley Mcconnell that cancer genetics is a rapidly advancing field and it is possible that new genetic tests will be appropriate for Ashley Mcconnell and/or Ashley Mcconnell family members in the future. We encouraged Ashley Mcconnell to remain in contact with cancer genetics on an annual basis so we can update Ashley Mcconnell personal and family histories and let Ashley Mcconnell know of advances in cancer genetics that may benefit this family.   Our contact number was provided. Ashley Mcconnell questions were answered to Ashley Mcconnell satisfaction, and she knows she is  welcome to call us at anytime with additional questions or concerns.    Ashley Rogue, MS, [facility] Genetic Counselor [email or web address] Phone: [phone]

## 2022-06-02 NOTE — Telephone Encounter (Signed)
I contacted Ms. Sedlack to discuss her genetic testing results. No pathogenic variants were identified in the 77 genes analyzed. Detailed clinic note to follow.   The test report has been scanned into EPIC and is located under the Molecular Pathology section of the Results Review tab.  A portion of the result report is included below for reference.      Faith Rogue, MS, [facility] Genetic Counselor [email or web address] Phone: [phone]

## 2022-06-09 ENCOUNTER — Encounter: Payer: Self-pay | Admitting: Cardiology

## 2022-06-13 NOTE — Progress Notes (Signed)
Patient ID: Ashley Mcconnell, female    DOB: 08-28-1961  MRN: [id number]  CC: Chronic Care Management   Subjective: Ashley Mcconnell is a 61 y.o. female who presents for chronic care management.   Her concerns today include:  - Doing well on blood pressure, diabetes, and cholesterol medications. Home blood sugars 170's - 220's.  - Needs refills on Ditropan. - Reports itching spots persisting on lower abdomen. No pain or drainage. Thinks spots may be related to bacteria/water at her previous apartment. States one of her neighbors had similar symptoms as well. Reports she is no longer living at that apartment recently moved 05/15/2022. Reports in the past she thought spots on her tongue were related to an allergic reaction to Bactrim. However, reports she recently noticed spots on her tongue around the time the spots on her lower abdomen returned. Since then spots on tongue have resolved. Dermatology referral not scheduled until February/March 2024.   - Planning for laser liposuction at [facility]. States she has consulted with her cardiologist who doesn't see any contraindications for procedure. Would like to know my opinion and who should complete pre-procedure paperwork. I discussed with patient agreement with her cardiologist and that it is preferred that cardiologist complete paperwork. However, if cardiologist will not/unable to complete paperwork we can schedule her an appointment with me and I will complete only if I see approval from her cardiologist in her chart. Patient agreeable.    Patient Active Problem List   Diagnosis Date Noted   Genetic testing 06/02/2022   Family history of breast cancer 03/29/2022   Vitamin D deficiency 11/17/2021   Neck pain 10/01/2021   Pain in joint of right shoulder 09/30/2021   Lump in lower inner quadrant of right breast 08/25/2021   COVID 05/30/2021   Bacterial vaginitis 04/28/2021   Obesity (BMI 30-39.9) 01/27/2021   Chest pain of uncertain etiology  78/67/5449   Diabetes mellitus due to underlying condition with hyperosmolarity without coma, without long-term current use of insulin ([place]) 10/31/2020   Abdominal pain 10/29/2020   Accumulation of fluid in tissues 10/29/2020   Arthralgia of lower leg 10/29/2020   Blood in feces 10/29/2020   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   Can't get food down 10/29/2020   Fatigue 10/29/2020   Hemorrhoids, internal 10/29/2020   History of colon polyps 10/29/2020   Infection of the upper respiratory tract 10/29/2020   Interdigital neuralgia 10/29/2020   Pulmonary embolism ([place])    Morton's neuroma    Migraine    Hypertension    Cardiomyopathy ([place])    Type 2 diabetes mellitus ([place]) 10/25/2020   Hyperlipemia 10/25/2020   Blood in urine 07/14/2020   Benign essential hypertension 06/01/2018   Family history of colon cancer requiring screening colonoscopy 02/09/2018   Family history of colonic polyps 02/09/2018   Hyperglycemia 11/09/2017   Prediabetes 12/10/2015   Chill 12/10/2015   FOM (frequency of micturition) 12/10/2015   Dyspnea 07/25/2015   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   GERD (gastroesophageal reflux disease) 07/25/2015   Obesity 07/25/2015   Chest wall pain 07/25/2015   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   Left flank pain 02/06/2013   Leukocytosis 02/06/2013     Current Outpatient Medications on File Prior to Visit  Medication Sig Dispense Refill   Blood Glucose Monitoring Suppl (TRUE METRIX METER) w/Device KIT Use as directed 1 kit 0   cyclobenzaprine (FLEXERIL) 5 MG tablet Take 5 mg by mouth at bedtime.     diclofenac (CATAFLAM) 50 MG  tablet Take 50 mg by mouth 3 (three) times daily as needed. (Patient not taking: Reported on 04/21/2022)     glucose blood (TRUE METRIX BLOOD GLUCOSE TEST) test strip Use as instructed 100 each 12   methocarbamol (ROBAXIN) 500 MG tablet Take 500 mg by mouth 3 (three) times daily. (Patient not taking: Reported on 05/24/2022)      mupirocin ointment (BACTROBAN) 2 % Apply 1 application topically 2 (two) times daily. 22 g 0   ondansetron (ZOFRAN-ODT) 8 MG disintegrating tablet Take 1 tablet (8 mg total) by mouth every 8 (eight) hours as needed for nausea or vomiting. 30 tablet 1   pantoprazole (PROTONIX) 40 MG tablet Take 1 tablet (40 mg total) by mouth daily. 30 tablet 3   phentermine 37.5 MG capsule Take 1 capsule (37.5 mg total) by mouth every morning. 30 capsule 0   phentermine 37.5 MG capsule Take 1 capsule by mouth every morning.     predniSONE (DELTASONE) 50 MG tablet Take 1 tablet 13 hours prior to CT, take 1 tablet 7 hours prior to CT and take 1 tablet 1 hour prior to CT 3 tablet 0   triamcinolone ointment (KENALOG) 0.1 % Apply 1 application topically 2 (two) times daily as needed (itching). 80 g 1   TRUEplus Lancets 28G MISC Use as directed 100 each 4   Vitamin D, Ergocalciferol, (DRISDOL) 1.25 MG (50000 UNIT) CAPS capsule Take 1 capsule (50,000 Units total) by mouth every 7 (seven) days. (Patient not taking: Reported on 04/21/2022) 4 capsule 3   No current facility-administered medications on file prior to visit.    Allergies  Allergen Reactions   Bactrim [Sulfamethoxazole-Trimethoprim]     Swelling tongue and itching mouth.   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    Occupation: Mudlogger  Tobacco Use   Smoking status: Former    Packs/day: 0.20    Years: 2.00    Total pack years: 0.40    Types: Cigarettes    Quit date: 07/24/1986    Years since quitting: 35.9    Passive exposure:  Past   Smokeless tobacco: Never   Tobacco comments:    social smoker  Vaping Use   Vaping Use: Never used  Substance and Sexual Activity   Alcohol use: Yes    Alcohol/week: 0.0 standard drinks of alcohol    Comment: 1 drink on New Year's Eve   Drug use: No   Sexual activity: Not Currently    Birth control/protection: Surgical  Other Topics Concern   Not on file  Social History Narrative   Patient lives at home with her family.   Caffeine Use: 1 cup of tea two times weekly   Social Determinants of Health   Financial Resource Strain: Not on file  Food Insecurity: Not on file  Transportation Needs: Not on file  Physical Activity: Not on file  Stress: Not on file  Social Connections: Not on file  Intimate Partner Violence: Not on file  Family History  Problem Relation Age of Onset   Breast cancer Mother        limited info   Stroke Father    Heart Problems Father        heart bypass   Melanoma Father        x2-3   Cancer Sister 55       colorectal   Alzheimer's disease Paternal Aunt    Alzheimer's disease Paternal Aunt    Alzheimer's disease Paternal Aunt    Cancer Paternal Aunt        unk type   Alzheimer's disease Paternal Uncle    Heart Problems Paternal Grandmother        enlarged heart   Testicular cancer Son    Stomach cancer Neg Hx    Colon cancer Neg Hx    Esophageal cancer Neg Hx     Past Surgical History:  Procedure Laterality Date   ABDOMINAL HYSTERECTOMY     CARDIAC SURGERY  04/2014   Catherization    CHOLECYSTECTOMY     CYSTOSCOPY/URETEROSCOPY/HOLMIUM LASER/STENT PLACEMENT Left 10/10/2018   Procedure: CYSTOSCOPY/URETEROSCOPY/HOLMIUM LASER/STENT PLACEMENT;  Surgeon: Ceasar Mons, MD;  Location: [facility];  Service: Urology;  Laterality: Left;   LITHOTRIPSY     x5   TONSILLECTOMY  1969    ROS: Review of Systems Negative except as stated above  PHYSICAL EXAM: BP 112/76 (BP Location: Left Arm, Patient Position: Sitting, Cuff  Size: Large)   Pulse 88   Temp 98.3 F (36.8 C)   Resp 16   Ht 5' 5" (1.651 m)   Wt 223 lb (101.2 kg)   SpO2 96%   BMI 37.11 kg/m   Physical Exam HENT:     Head: Normocephalic and atraumatic.  Eyes:     Extraocular Movements: Extraocular movements intact.     Conjunctiva/sclera: Conjunctivae normal.     Pupils: Pupils are equal, round, and reactive to light.  Cardiovascular:     Rate and Rhythm: Normal rate and regular rhythm.     Pulses: Normal pulses.     Heart sounds: Normal heart sounds.  Pulmonary:     Effort: Pulmonary effort is normal.     Breath sounds: Normal breath sounds.  Musculoskeletal:     Cervical back: Normal range of motion and neck supple.  Skin:    General: Skin is warm and dry.     Comments: Three erythematous flaking macular skin lesions of panniculus. No evidence of drainage.   Neurological:     General: No focal deficit present.     Mental Status: She is alert and oriented to person, place, and time.  Psychiatric:        Mood and Affect: Mood normal.        Behavior: Behavior normal.    ASSESSMENT AND PLAN: 1. Essential (primary) hypertension - Continue Hydrochlorothiazide as prescribed.  - Counseled on blood pressure goal of less than 130/80, low-sodium, DASH diet, medication compliance, and 150 minutes of moderate intensity exercise per week as tolerated. Counseled on medication adherence and adverse effects. - Update BMP.  - Discussed with patient she is due for annual appointment with cardiologist and to schedule soon. Patient verbalized understanding and agreement.  - Follow-up with primary provider in 3 months or sooner if needed.  - Basic Metabolic Panel - hydrochlorothiazide (HYDRODIURIL) 12.5 MG tablet; Take 1 tablet (12.5 mg total) by mouth daily.  Dispense: 30 tablet; Refill: 2  2. Type 2 diabetes mellitus without complication, without long-term  current use of insulin ([facility]) - Continue Metformin as prescribed.  - Discussed the  importance of healthy eating habits, low-carbohydrate diet, low-sugar diet, regular aerobic exercise (at least 150 minutes a week as tolerated) and medication compliance to achieve or maintain control of diabetes. - Sending hemoglobin A1c to lab, result pending.  - Follow-up with primary provider as scheduled.  - Hemoglobin A1c - metFORMIN (GLUCOPHAGE) 500 MG tablet; Take 1 tablet (500 mg total) by mouth at bedtime.  Dispense: 30 tablet; Refill: 2  3. Hyperlipidemia, unspecified hyperlipidemia type - Continue Atorvastatin as prescribed.  - Follow-up with primary provider as scheduled.  - atorvastatin (LIPITOR) 20 MG tablet; Take 1 tablet (20 mg total) by mouth daily.  Dispense: 30 tablet; Refill: 2  4. Overactive bladder 5. Bladder spasms - Continue Oxybutynin as prescribed.  - Follow-up with primary provider as scheduled.  - oxybutynin (DITROPAN-XL) 10 MG 24 hr tablet; Take 1 tablet (10 mg total) by mouth daily.  Dispense: 30 tablet; Refill: 2  6. Skin irritation 7. Skin infection - Cephalexin as prescribed.  - Keep all scheduled appointments with Dermatology.  - cephALEXin (KEFLEX) 500 MG capsule; Take 1 capsule (500 mg total) by mouth 4 (four) times daily for 5 days.  Dispense: 20 capsule; Refill: 0   Patient was given the opportunity to ask questions.  Patient verbalized understanding of the plan and was able to repeat key elements of the plan. Patient was given clear instructions to go to Emergency Department or return to medical center if symptoms don't improve, worsen, or new problems develop.The patient verbalized understanding.   Orders Placed This Encounter  Procedures   Basic Metabolic Panel   Hemoglobin A1c     Requested Prescriptions   Signed Prescriptions Disp Refills   hydrochlorothiazide (HYDRODIURIL) 12.5 MG tablet 30 tablet 2    Sig: Take 1 tablet (12.5 mg total) by mouth daily.   metFORMIN (GLUCOPHAGE) 500 MG tablet 30 tablet 2    Sig: Take 1 tablet (500 mg  total) by mouth at bedtime.   atorvastatin (LIPITOR) 20 MG tablet 30 tablet 2    Sig: Take 1 tablet (20 mg total) by mouth daily.   oxybutynin (DITROPAN-XL) 10 MG 24 hr tablet 30 tablet 2    Sig: Take 1 tablet (10 mg total) by mouth daily.   cephALEXin (KEFLEX) 500 MG capsule 20 capsule 0    Sig: Take 1 capsule (500 mg total) by mouth 4 (four) times daily for 5 days.    Return in about 3 months (around 09/20/2022) for Follow-Up or next available chronic care mgmt.  Camillia Herter, NP

## 2022-06-21 ENCOUNTER — Encounter: Payer: Self-pay | Admitting: Family

## 2022-06-21 ENCOUNTER — Ambulatory Visit: Payer: Managed Care, Other (non HMO) | Admitting: Family

## 2022-06-21 VITALS — BP 112/76 | HR 88 | Temp 98.3°F | Resp 16 | Ht 65.0 in | Wt 223.0 lb

## 2022-06-21 DIAGNOSIS — R238 Other skin changes: Secondary | ICD-10-CM

## 2022-06-21 DIAGNOSIS — I10 Essential (primary) hypertension: Secondary | ICD-10-CM | POA: Diagnosis not present

## 2022-06-21 DIAGNOSIS — E785 Hyperlipidemia, unspecified: Secondary | ICD-10-CM

## 2022-06-21 DIAGNOSIS — N3281 Overactive bladder: Secondary | ICD-10-CM

## 2022-06-21 DIAGNOSIS — E119 Type 2 diabetes mellitus without complications: Secondary | ICD-10-CM | POA: Diagnosis not present

## 2022-06-21 DIAGNOSIS — N3289 Other specified disorders of bladder: Secondary | ICD-10-CM

## 2022-06-21 DIAGNOSIS — L089 Local infection of the skin and subcutaneous tissue, unspecified: Secondary | ICD-10-CM

## 2022-06-21 MED ORDER — HYDROCHLOROTHIAZIDE 12.5 MG PO TABS: 12.5000 mg | ORAL_TABLET | Freq: Every day | ORAL | 2 refills | Status: DC

## 2022-06-21 MED ORDER — CEPHALEXIN 500 MG PO CAPS: 500.0000 mg | ORAL_CAPSULE | Freq: Four times a day (QID) | ORAL | 0 refills | Status: AC

## 2022-06-21 MED ORDER — ATORVASTATIN CALCIUM 20 MG PO TABS: 20.0000 mg | ORAL_TABLET | Freq: Every day | ORAL | 2 refills | Status: DC

## 2022-06-21 MED ORDER — OXYBUTYNIN CHLORIDE ER 10 MG PO TB24: 10.0000 mg | ORAL_TABLET | Freq: Every day | ORAL | 2 refills | Status: DC

## 2022-06-21 MED ORDER — METFORMIN HCL 500 MG PO TABS: 500.0000 mg | ORAL_TABLET | Freq: Every day | ORAL | 2 refills | Status: DC

## 2022-06-21 NOTE — Patient Instructions (Signed)
Oxybutynin Tablets What is this medication? OXYBUTYNIN (ox i BYOO ti nin) treats symptoms of an overactive bladder, such as loss of bladder control or frequent need to urinate. It works by relaxing muscles in the bladder. It belongs to a group of medications called antispasmodics. This medicine may be used for other purposes; ask your health care provider or pharmacist if you have questions. COMMON BRAND NAME(S): Ditropan What should I tell my care team before I take this medication? They need to know if you have any of these conditions: Autonomic neuropathy Dementia Difficulty passing urine Glaucoma Intestinal obstruction Kidney disease Liver disease Myasthenia gravis Parkinson's disease An unusual or allergic reaction to oxybutynin, other medications, foods, dyes, or preservatives Pregnant or trying to get pregnant Breast-feeding How should I use this medication? Take this medication by mouth with a glass of water. Follow the directions on the prescription label. You can take this medication with or without food. Take your medication at regular intervals. Do not take your medication more often than directed. Talk to your care team about the use of this medication in children. Special care may be needed. While this medication may be prescribed for children as young as 5 years for selected conditions, precautions do apply. Overdosage: If you think you have taken too much of this medicine contact a poison control center or emergency room at once. NOTE: This medicine is only for you. Do not share this medicine with others. What if I miss a dose? If you miss a dose, take it as soon as you can. If it is almost time for your next dose, take only that dose. Do not take double or extra doses. What may interact with this medication? Antihistamines for allergy, cough and cold Atropine Certain medications for bladder problems like oxybutynin, tolterodine Certain medications for Parkinson's  disease like benztropine, trihexyphenidyl Certain medications for stomach problems like dicyclomine, hyoscyamine Certain medications for travel sickness like scopolamine Clarithromycin Erythromycin Ipratropium Medications for fungal infections, like fluconazole, itraconazole, ketoconazole or voriconazole This list may not describe all possible interactions. Give your health care provider a list of all the medicines, herbs, non-prescription drugs, or dietary supplements you use. Also tell them if you smoke, drink alcohol, or use illegal drugs. Some items may interact with your medicine. What should I watch for while using this medication? It may take a few weeks to notice the full benefit from this medication. You may need to limit your intake tea, coffee, caffeinated sodas, and alcohol. These drinks may make your symptoms worse. You may get drowsy or dizzy. Do not drive, use machinery, or do anything that needs mental alertness until you know how this medication affects you. Do not stand or sit up quickly, especially if you are an older patient. This reduces the risk of dizzy or fainting spells. Alcohol may interfere with the effect of this medication. Avoid alcoholic drinks. Your mouth may get dry. Chewing sugarless gum or sucking hard candy, and drinking plenty of water may help. Contact your care team if the problem does not go away or is severe. This medication may cause dry eyes and blurred vision. If you wear contact lenses, you may feel some discomfort. Lubricating drops may help. See your eyecare professional if the problem does not go away or is severe. Avoid extreme heat. This medication can cause you to sweat less than normal. Your body temperature could increase to dangerous levels, which may lead to heat stroke. What side effects may I notice from receiving  this medication? Side effects that you should report to your care team as soon as possible: Allergic reactions or angioedema--skin  rash, itching, hives, swelling of the face, eyes, lips, tongue, arms, or legs, trouble swallowing or breathing Sudden eye pain or change in vision such as blurry vision, seeing halos around lights, vision loss Trouble passing urine Side effects that usually do not require medical attention (report to your care team if they continue or are bothersome): Confusion Constipation Dizziness Drowsiness Dry mouth Headache This list may not describe all possible side effects. Call your doctor for medical advice about side effects. You may report side effects to FDA at 1-800-FDA-1088. Where should I keep my medication? Keep out of the reach of children. Store at room temperature between 15 and 30 degrees C (59 and 86 degrees F). Protect from moisture and humidity. Throw away any unused medication after the expiration date. NOTE: This sheet is a summary. It may not cover all possible information. If you have questions about this medicine, talk to your doctor, pharmacist, or health care provider.  2023 Elsevier/Gold Standard (2021-01-12 00:00:00)

## 2022-06-21 NOTE — Progress Notes (Signed)
.  Pt presents for chronic care management   -needs Oxybutynin Rx updated and sent in pharmacy for 90 day supply

## 2022-06-22 ENCOUNTER — Encounter: Payer: Self-pay | Admitting: Family

## 2022-06-22 ENCOUNTER — Telehealth: Payer: Self-pay | Admitting: Family

## 2022-06-22 LAB — BASIC METABOLIC PANEL
BUN/Creatinine Ratio: 24 — ABNORMAL HIGH (ref 9–23)
BUN: 17 mg/dL (ref 6–24)
CO2: 24 mmol/L (ref 20–29)
Calcium: 9.4 mg/dL (ref 8.7–10.2)
Chloride: 104 mmol/L (ref 96–106)
Creatinine, Ser: 0.71 mg/dL (ref 0.57–1.00)
Glucose: 147 mg/dL — ABNORMAL HIGH (ref 70–99)
Potassium: 4.7 mmol/L (ref 3.5–5.2)
Sodium: 146 mmol/L — ABNORMAL HIGH (ref 134–144)
eGFR: 98 mL/min/{1.73_m2} (ref >59)

## 2022-06-22 LAB — HEMOGLOBIN A1C
Est. average glucose Bld gHb Est-mCnc: 160 mg/dL
Hgb A1c MFr Bld: 7.2 % — ABNORMAL HIGH (ref 4.8–5.6)

## 2022-06-22 NOTE — Telephone Encounter (Signed)
Copied from [place] [id number]. Topic: General - Other >> Jun 22, 2022 10:13 AM Chapman Fitch wrote: Reason for CRM: Waldron Session called and will be faxing over surgical clearance and needs a call with the A1C numbers / please advise

## 2022-06-22 NOTE — Telephone Encounter (Signed)
Forms received, pending completion, provider needs to see information from pt cardiologist in chart that she is cleared for procedure from cardiac standpoint.

## 2022-06-24 ENCOUNTER — Ambulatory Visit: Payer: Managed Care, Other (non HMO) | Admitting: Internal Medicine

## 2022-06-24 ENCOUNTER — Encounter: Payer: Self-pay | Admitting: Internal Medicine

## 2022-06-24 VITALS — BP 182/71 | HR 91 | Temp 98.6°F | Resp 19 | Ht 65.0 in | Wt 228.0 lb

## 2022-06-24 DIAGNOSIS — K3189 Other diseases of stomach and duodenum: Secondary | ICD-10-CM | POA: Diagnosis not present

## 2022-06-24 DIAGNOSIS — K219 Gastro-esophageal reflux disease without esophagitis: Secondary | ICD-10-CM

## 2022-06-24 DIAGNOSIS — K319 Disease of stomach and duodenum, unspecified: Secondary | ICD-10-CM | POA: Diagnosis not present

## 2022-06-24 DIAGNOSIS — R1013 Epigastric pain: Secondary | ICD-10-CM

## 2022-06-24 DIAGNOSIS — K449 Diaphragmatic hernia without obstruction or gangrene: Secondary | ICD-10-CM

## 2022-06-24 MED ORDER — SODIUM CHLORIDE 0.9 % IV SOLN: 500.0000 mL | Freq: Once | INTRAVENOUS | Status: DC

## 2022-06-24 NOTE — Op Note (Signed)
[facility] Patient Name: Kam Kushnir Procedure Date: 06/24/2022 4:04 PM MRN: [id number] Endoscopist: Jerene Bears , MD Age: 61 Referring MD:  Date of Birth: Aug 01, 1961 Gender: Female Account #: [id number] Procedure:                Upper GI endoscopy Indications:              Epigastric abdominal pain, Gastro-esophageal reflux                            disease; symptoms have improved since beginning                            pantoprazole (at last office visit) Medicines:                Monitored Anesthesia Care Procedure:                Pre-Anesthesia Assessment:                           - Prior to the procedure, a History and Physical                            was performed, and patient medications and                            allergies were reviewed. The patient's tolerance of                            previous anesthesia was also reviewed. The risks                            and benefits of the procedure and the sedation                            options and risks were discussed with the patient.                            All questions were answered, and informed consent                            was obtained. Prior Anticoagulants: The patient has                            taken no previous anticoagulant or antiplatelet                            agents. ASA Grade Assessment: III - A patient with                            severe systemic disease. After reviewing the risks                            and benefits, the patient was deemed in  satisfactory condition to undergo the procedure.                           After obtaining informed consent, the endoscope was                            passed under direct vision. Throughout the                            procedure, the patient's blood pressure, pulse, and                            oxygen saturations were monitored continuously. The                            GIF [id number] #[id number]  was introduced through the                            mouth, and advanced to the second part of duodenum.                            The upper GI endoscopy was accomplished without                            difficulty. The patient tolerated the procedure                            well. Scope In: Scope Out: Findings:                 The examined esophagus was normal.                           A 2 cm hiatal hernia was present.                           Patchy mildly erythematous mucosa was found in the                            gastric body and in the gastric antrum. Biopsies                            were taken with a cold forceps for histology and                            Helicobacter pylori testing.                           The examined duodenum was normal. Complications:            No immediate complications. Estimated Blood Loss:     Estimated blood loss was minimal. Impression:               - Normal esophagus.                           -  2 cm hiatal hernia.                           - Erythematous mucosa in the gastric body and                            antrum. Biopsied.                           - Normal examined duodenum. Recommendation:           - Patient has a contact number available for                            emergencies. The signs and symptoms of potential                            delayed complications were discussed with the                            patient. Return to normal activities tomorrow.                            Written discharge instructions were provided to the                            patient.                           - Resume previous diet.                           - Continue present medications.                           - Await pathology results. Jerene Bears, MD 06/24/2022 4:43:49 PM This report has been signed electronically.

## 2022-06-24 NOTE — Progress Notes (Signed)
Pt in recovery with monitors in place, VSS. Report given to receiving RN. Bite guard was placed with pt awake to ensure comfort. No dental or soft tissue damage noted. 

## 2022-06-24 NOTE — Progress Notes (Signed)
Patient presenting for upper endoscopy to evaluate left upper quadrant pain, epigastric pain and GERD See office note dated 05/24/2022 for current H&P and details She is appropriate for EGD here today.

## 2022-06-24 NOTE — Progress Notes (Signed)
VS completed by DT.  Pt's states no medical or surgical changes since previsit or office visit.  

## 2022-06-24 NOTE — Progress Notes (Signed)
Called to room to assist during endoscopic procedure.  Patient ID and intended procedure confirmed with present staff. Received instructions for my participation in the procedure from the performing physician.  

## 2022-06-24 NOTE — Patient Instructions (Signed)
Please read handouts provided. Continue present medications. Await pathology results.  YOU HAD AN ENDOSCOPIC PROCEDURE TODAY AT THE [facility]:   Refer to the procedure report that was given to you for any specific questions about what was found during the examination.  If the procedure report does not answer your questions, please call your gastroenterologist to clarify.  If you requested that your care partner not be given the details of your procedure findings, then the procedure report has been included in a sealed envelope for you to review at your convenience later.  YOU SHOULD EXPECT: Some feelings of bloating in the abdomen. Passage of more gas than usual.  Walking can help get rid of the air that was put into your GI tract during the procedure and reduce the bloating. If you had a lower endoscopy (such as a colonoscopy or flexible sigmoidoscopy) you may notice spotting of blood in your stool or on the toilet paper. If you underwent a bowel prep for your procedure, you may not have a normal bowel movement for a few days.  Please Note:  You might notice some irritation and congestion in your nose or some drainage.  This is from the oxygen used during your procedure.  There is no need for concern and it should clear up in a day or so.  SYMPTOMS TO REPORT IMMEDIATELY:  Following upper endoscopy (EGD)  Vomiting of blood or coffee ground material  New chest pain or pain under the shoulder blades  Painful or persistently difficult swallowing  New shortness of breath  Fever of 100F or higher  Black, tarry-looking stools  For urgent or emergent issues, a gastroenterologist can be reached at any hour by calling [phone]. Do not use MyChart messaging for urgent concerns.    DIET:  We do recommend a small meal at first, but then you may proceed to your regular diet.  Drink plenty of fluids but you should avoid alcoholic beverages for 24 hours.  ACTIVITY:  You should plan  to take it easy for the rest of today and you should NOT DRIVE or use heavy machinery until tomorrow (because of the sedation medicines used during the test).    FOLLOW UP: Our staff will call the number listed on your records the next business day following your procedure.  We will call around 7:15- 8:00 am to check on you and address any questions or concerns that you may have regarding the information given to you following your procedure. If we do not reach you, we will leave a message.     If any biopsies were taken you will be contacted by phone or by letter within the next 1-3 weeks.  Please call us at [phone] if you have not heard about the biopsies in 3 weeks.    SIGNATURES/CONFIDENTIALITY: You and/or your care partner have signed paperwork which will be entered into your electronic medical record.  These signatures attest to the fact that that the information above on your After Visit Summary has been reviewed and is understood.  Full responsibility of the confidentiality of this discharge information lies with you and/or your care-partner. 

## 2022-06-25 ENCOUNTER — Telehealth: Payer: Self-pay | Admitting: *Deleted

## 2022-06-25 NOTE — Telephone Encounter (Signed)
  Follow up Call-     06/24/2022    3:03 PM  Call back number  Post procedure Call Back phone  # [phone]  Permission to leave phone message Yes     Patient questions:  Do you have a fever, pain , or abdominal swelling? No. Pain Score  0 *  Have you tolerated food without any problems? Yes.    Have you been able to return to your normal activities? Yes.    Do you have any questions about your discharge instructions: Diet   No. Medications  No. Follow up visit  No.  Do you have questions or concerns about your Care? No.  Actions: * If pain score is 4 or above: No action needed, pain <4.

## 2022-06-28 ENCOUNTER — Telehealth: Payer: Self-pay | Admitting: Family

## 2022-06-28 NOTE — Telephone Encounter (Signed)
I return to the office on 07/05/2022. I will ask CMA to reach out to patient at that time. Also, patient will need surgical clearance form completed/ surgical approval by her cardiologist Berniece Salines, DO for official surgical clearance. I discussed this in detail with patient during her appointment with me on 06/21/2022.

## 2022-06-28 NOTE — Telephone Encounter (Signed)
Copied from [place] [phone] [id number]. Topic: General - Other >> Jun 28, 2022 10:45 AM Cyndi Bender wrote: Reason for CRM: Benjamine Mola with [facility] called to follow up on the requests for medical clearance and patient's A1C reading. Cb# [phone]

## 2022-06-29 ENCOUNTER — Encounter: Payer: Self-pay | Admitting: Internal Medicine

## 2022-06-30 ENCOUNTER — Telehealth: Payer: Self-pay | Admitting: Family

## 2022-06-30 NOTE — Telephone Encounter (Signed)
Please see previous phone encounters dated 06-28-22. Ashley Mcconnell is requesting a call back on 07-01-22.

## 2022-07-01 NOTE — Telephone Encounter (Signed)
Spoke to pt states that she will be contacting cardiologist on 09/22 to see if [facility] sent over forms to their office. Once cardiologist has cleared pt for surgery, then PCP will evaluate for clearance

## 2022-07-01 NOTE — Telephone Encounter (Signed)
Will have to speak w/pt provider about surgery clearance before contacting [place], provider out of office until 09/25

## 2022-07-11 HISTORY — PX: LIPOSUCTION: SHX10

## 2022-07-12 ENCOUNTER — Telehealth: Payer: Self-pay | Admitting: Family

## 2022-07-12 NOTE — Progress Notes (Signed)
Patient did not see me on today. See note from Elmon Else, [place].

## 2022-07-12 NOTE — Telephone Encounter (Signed)
Copied from [place] [phone]. Topic: General - Inquiry >> Jul 12, 2022 10:58 AM Erskine Squibb wrote: Reason for CRM: Benjamine Mola with [facility] has called back inquiring about the patients surgical clearance and recent lab work including her most recent A1C. Please assist as soon as possible

## 2022-07-12 NOTE — Telephone Encounter (Signed)
Will discuss with patient at 10/11 visit

## 2022-07-13 NOTE — Telephone Encounter (Signed)
Noted. Unsure of reason I was routed in message, maybe possible error? Sending to PCP as FYI.----DD,RMA

## 2022-07-13 NOTE — Telephone Encounter (Signed)
Noted  

## 2022-07-21 ENCOUNTER — Encounter: Payer: Managed Care, Other (non HMO) | Admitting: Family

## 2022-07-21 ENCOUNTER — Other Ambulatory Visit: Payer: Self-pay | Admitting: Family

## 2022-07-21 DIAGNOSIS — E119 Type 2 diabetes mellitus without complications: Secondary | ICD-10-CM

## 2022-07-21 MED ORDER — METFORMIN HCL 500 MG PO TABS: 500.0000 mg | ORAL_TABLET | Freq: Two times a day (BID) | ORAL | 2 refills | Status: DC

## 2022-07-21 NOTE — Progress Notes (Signed)
Pt came in for appointment, which she thought was for [facility] medical clearance paperwork that has already been cleared by Cardiologist, and that she really needed to get to work by 9am, adv PCP which stated pt should be here for 4 week f/u of Metformin increase, called pt because she had already left office, which stated that she is doing fine on dose increase and did not need to talk to provider, she will see her at 3 month f/u Pt declined appt today.

## 2022-07-21 NOTE — Telephone Encounter (Signed)
Metformin order updated with refills.

## 2022-07-22 ENCOUNTER — Other Ambulatory Visit: Payer: Self-pay | Admitting: Physician Assistant

## 2022-07-28 ENCOUNTER — Other Ambulatory Visit: Payer: Self-pay | Admitting: Family

## 2022-07-28 DIAGNOSIS — E785 Hyperlipidemia, unspecified: Secondary | ICD-10-CM

## 2022-09-13 NOTE — Progress Notes (Signed)
Patient ID: Ashley Mcconnell, female    DOB: 1961-09-29  MRN: [id number]  CC: Chronic Care Management   Subjective: Ashley Mcconnell is a 61 y.o. female who presents for chronic care management.   Her concerns today include:  Doing well on chronic care medications, no issues or concerns. Discussed with patient the importance of annual follow-up with Cardiology. She is overdue for appointment with them since 01/27/2022. States she will try to schedule with them soon. Concerns for right lower abdomen incision drainage secondary to liposuction surgery completed 08/06/2022. Drainage began 4 days ago. She contacted the surgeon who advised to cleanse the area with Neosporin and leave open to air. She is using Neosporin and covering with a bandage especially since drainage has gotten worse since began. Denies pain at the incision site. She is scheduled to see the surgeon at the end of January 2024. Discussed with patient to contact surgeon back for an appointment sooner. Ashley Mcconnell, CMA assisted patient with cleansing area today in office with sterile saline and pat to dry with gauze. No further issues/concerns.  Patient Active Problem List   Diagnosis Date Noted   Genetic testing 06/02/2022   Family history of breast cancer 03/29/2022   Vitamin D deficiency 11/17/2021   Neck pain 10/01/2021   Pain in joint of right shoulder 09/30/2021   Lump in lower inner quadrant of right breast 08/25/2021   COVID 05/30/2021   Bacterial vaginitis 04/28/2021   Obesity (BMI 30-39.9) 01/27/2021   Chest pain of uncertain etiology 04/09/1600   Diabetes mellitus due to underlying condition with hyperosmolarity without coma, without long-term current use of insulin ([place]) 10/31/2020   Abdominal pain 10/29/2020   Accumulation of fluid in tissues 10/29/2020   Arthralgia of lower leg 10/29/2020   Blood in feces 10/29/2020   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   Can't get food down 10/29/2020   Fatigue  10/29/2020   Hemorrhoids, internal 10/29/2020   History of colon polyps 10/29/2020   Infection of the upper respiratory tract 10/29/2020   Interdigital neuralgia 10/29/2020   Pulmonary embolism ([place])    Morton's neuroma    Migraine    Hypertension    Cardiomyopathy ([place])    Type 2 diabetes mellitus ([place]) 10/25/2020   Hyperlipemia 10/25/2020   Blood in urine 07/14/2020   Benign essential hypertension 06/01/2018   Family history of colon cancer requiring screening colonoscopy 02/09/2018   Family history of colonic polyps 02/09/2018   Hyperglycemia 11/09/2017   Prediabetes 12/10/2015   Chill 12/10/2015   FOM (frequency of micturition) 12/10/2015   Dyspnea 07/25/2015   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   GERD (gastroesophageal reflux disease) 07/25/2015   Obesity 07/25/2015   Chest wall pain 07/25/2015   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   Left flank pain 02/06/2013   Leukocytosis 02/06/2013     Current Outpatient Medications on File Prior to Visit  Medication Sig Dispense Refill   Blood Glucose Monitoring Suppl (TRUE METRIX METER) w/Device KIT Use as directed 1 kit 0   cyclobenzaprine (FLEXERIL) 5 MG tablet Take 5 mg by mouth at bedtime.     diclofenac (CATAFLAM) 50 MG tablet Take 50 mg by mouth 3 (three) times daily as needed. (Patient not taking: Reported on 04/21/2022)     glucose blood (TRUE METRIX BLOOD GLUCOSE TEST) test strip Use as instructed 100 each 12   hydrochlorothiazide (HYDRODIURIL) 12.5 MG tablet TAKE 1 TABLET BY MOUTH EVERY DAY 90 tablet 0   mupirocin ointment (BACTROBAN) 2 % Apply 1  application topically 2 (two) times daily. 22 g 0   ondansetron (ZOFRAN-ODT) 8 MG disintegrating tablet Take 1 tablet (8 mg total) by mouth every 8 (eight) hours as needed for nausea or vomiting. 30 tablet 1   pantoprazole (PROTONIX) 40 MG tablet TAKE 1 TABLET BY MOUTH EVERY DAY 90 tablet 1   triamcinolone ointment (KENALOG) 0.1 % Apply 1 application topically 2 (two)  times daily as needed (itching). 80 g 1   TRUEplus Lancets 28G MISC Use as directed 100 each 4   Vitamin D, Ergocalciferol, (DRISDOL) 1.25 MG (50000 UNIT) CAPS capsule Take 1 capsule (50,000 Units total) by mouth every 7 (seven) days. (Patient not taking: Reported on 04/21/2022) 4 capsule 3   No current facility-administered medications on file prior to visit.    Allergies  Allergen Reactions   Fish Allergy Rash    Ashley Mcconnell is only fish she does not have allergy to   Iodinated Contrast Media Other (See Comments)    Rash  Rash    Shellfish Allergy Itching and Rash   Sulfamethoxazole-Trimethoprim Itching and Swelling    Swelling tongue and itching mouth. Swelling tongue and itching mouth.     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    Occupation: Mudlogger  Tobacco Use   Smoking status: Former    Packs/day: 0.20    Years: 2.00    Total pack years: 0.40    Types: Cigarettes    Quit date: 07/24/1986    Years since quitting: 36.1    Passive exposure: Past   Smokeless tobacco: Never   Tobacco comments:    social smoker  Vaping Use   Vaping Use: Never used  Substance and Sexual Activity   Alcohol use: Yes    Alcohol/week: 0.0 standard drinks of alcohol    Comment: 1 drink on New Year's Eve   Drug use: No   Sexual activity: Not Currently    Birth control/protection: Surgical  Other Topics Concern   Not on file  Social History Narrative   Patient lives at home with her family.   Caffeine Use: 1 cup of tea two times weekly   Social Determinants of Health   Financial Resource Strain: Not on file  Food Insecurity: Not on file  Transportation Needs: Not on file  Physical Activity: Not on file  Stress: Not on file  Social  Connections: Not on file  Intimate Partner Violence: Not on file    Family History  Problem Relation Age of Onset   Breast cancer Mother        limited info   Stroke Father    Heart Problems Father        heart bypass   Melanoma Father        x2-3   Cancer Sister 4       colorectal   Alzheimer's disease Paternal Aunt    Alzheimer's disease Paternal Aunt    Alzheimer's disease Paternal Aunt    Cancer Paternal Aunt        unk type   Alzheimer's disease Paternal Uncle    Heart Problems Paternal Grandmother        enlarged heart   Testicular cancer Son    Stomach  cancer Neg Hx    Colon cancer Neg Hx    Esophageal cancer Neg Hx     Past Surgical History:  Procedure Laterality Date   ABDOMINAL HYSTERECTOMY     CARDIAC SURGERY  04/10/2014   Catherization    CHOLECYSTECTOMY     COLONOSCOPY     CYSTOSCOPY/URETEROSCOPY/HOLMIUM LASER/STENT PLACEMENT Left 10/10/2018   Procedure: CYSTOSCOPY/URETEROSCOPY/HOLMIUM LASER/STENT PLACEMENT;  Surgeon: Ceasar Mons, MD;  Location: [facility];  Service: Urology;  Laterality: Left;   LIPOSUCTION     LITHOTRIPSY     x5   TONSILLECTOMY  10/12/1967   UPPER GASTROINTESTINAL ENDOSCOPY      ROS: Review of Systems Negative except as stated above  PHYSICAL EXAM: BP 125/79 (BP Location: Left Arm, Patient Position: Sitting, Cuff Size: Large)   Pulse 90   Temp 98.3 F (36.8 C)   Resp 16   Ht _0  (1.651 m)   Wt 220 lb (99.8 kg)   SpO2 96%   BMI 36.61 kg/m   Physical Exam HENT:     Head: Normocephalic and atraumatic.  Eyes:     Extraocular Movements: Extraocular movements intact.     Conjunctiva/sclera: Conjunctivae normal.     Pupils: Pupils are equal, round, and reactive to light.  Cardiovascular:     Rate and Rhythm: Normal rate and regular rhythm.     Pulses: Normal pulses.     Heart sounds: Normal heart sounds.  Pulmonary:     Effort: Pulmonary effort is normal.     Breath sounds: Normal breath sounds.   Abdominal:     Comments: Right lower abdomen with surgical wound with pus drainage and erythematous edge about dime sized. No odor. Patient denies pain.  Musculoskeletal:     Cervical back: Normal range of motion and neck supple.  Neurological:     General: No focal deficit present.     Mental Status: She is alert and oriented to person, place, and time.  Psychiatric:        Mood and Affect: Mood normal.        Behavior: Behavior normal.     Results for orders placed or performed in visit on 09/20/22  POCT glycosylated hemoglobin (Hb A1C)  Result Value Ref Range   Hemoglobin A1C 6.3 (A) 4.0 - 5.6 %   HbA1c POC (<> result, manual entry)     HbA1c, POC (prediabetic range)     HbA1c, POC (controlled diabetic range)      ASSESSMENT AND PLAN: 1. Benign essential hypertension - Continue Hydrochlorothiazide as prescribed.  - Counseled on blood pressure goal of less than 130/80, low-sodium, DASH diet, medication compliance, and 150 minutes of moderate intensity exercise per week as tolerated. Counseled on medication adherence and adverse effects. - Patient encouraged to schedule annual appointment with Cardiology soon considering she is overdue.  - Follow-up with primary provider in 3 months or sooner if needed.   2. Type 2 diabetes mellitus without complication, without long-term current use of insulin (HCC) - Hemoglobin A1c at goal at 6.3%, goal 7%.  - Continue Metformin as prescribed.  - Discussed the importance of healthy eating habits, low-carbohydrate diet, low-sugar diet, regular aerobic exercise (at least 150 minutes a week as tolerated) and medication compliance to achieve or maintain control of diabetes. - Follow-up with primary provider in 3 months or sooner if needed.  - POCT glycosylated hemoglobin (Hb A1C) - metFORMIN (GLUCOPHAGE) 500 MG tablet; Take 1 tablet (500 mg total) by mouth 2 (two) times  daily with a meal.  Dispense: 60 tablet; Refill: 2  3. Hyperlipidemia,  unspecified hyperlipidemia type - Continue Atorvastatin as prescribed.  - Follow-up with primary provider as scheduled.  - atorvastatin (LIPITOR) 20 MG tablet; Take 1 tablet (20 mg total) by mouth daily.  Dispense: 30 tablet; Refill: 2   Patient was given the opportunity to ask questions.  Patient verbalized understanding of the plan and was able to repeat key elements of the plan. Patient was given clear instructions to go to Emergency Department or return to medical center if symptoms don't improve, worsen, or new problems develop.The patient verbalized understanding.   Orders Placed This Encounter  Procedures   POCT glycosylated hemoglobin (Hb A1C)     Requested Prescriptions   Signed Prescriptions Disp Refills   metFORMIN (GLUCOPHAGE) 500 MG tablet 60 tablet 2    Sig: Take 1 tablet (500 mg total) by mouth 2 (two) times daily with a meal.   atorvastatin (LIPITOR) 20 MG tablet 30 tablet 2    Sig: Take 1 tablet (20 mg total) by mouth daily.    Return in about 3 months (around 12/20/2022) for Follow-Up or next available chronic care mgmt.  Camillia Herter, NP

## 2022-09-15 ENCOUNTER — Other Ambulatory Visit: Payer: Self-pay | Admitting: Family

## 2022-09-15 DIAGNOSIS — I10 Essential (primary) hypertension: Secondary | ICD-10-CM

## 2022-09-20 ENCOUNTER — Ambulatory Visit: Payer: Managed Care, Other (non HMO) | Admitting: Family

## 2022-09-20 ENCOUNTER — Encounter: Payer: Self-pay | Admitting: Family

## 2022-09-20 VITALS — BP 125/79 | HR 90 | Temp 98.3°F | Resp 16 | Ht 65.0 in | Wt 220.0 lb

## 2022-09-20 DIAGNOSIS — E785 Hyperlipidemia, unspecified: Secondary | ICD-10-CM

## 2022-09-20 DIAGNOSIS — N3281 Overactive bladder: Secondary | ICD-10-CM

## 2022-09-20 DIAGNOSIS — I10 Essential (primary) hypertension: Secondary | ICD-10-CM | POA: Diagnosis not present

## 2022-09-20 DIAGNOSIS — E119 Type 2 diabetes mellitus without complications: Secondary | ICD-10-CM | POA: Diagnosis not present

## 2022-09-20 LAB — POCT GLYCOSYLATED HEMOGLOBIN (HGB A1C): Hemoglobin A1C: 6.3 % — AB (ref 4.0–5.6)

## 2022-09-20 MED ORDER — ATORVASTATIN CALCIUM 20 MG PO TABS: 20.0000 mg | ORAL_TABLET | Freq: Every day | ORAL | 2 refills | Status: DC

## 2022-09-20 MED ORDER — METFORMIN HCL 500 MG PO TABS: 500.0000 mg | ORAL_TABLET | Freq: Two times a day (BID) | ORAL | 2 refills | Status: DC

## 2022-09-20 NOTE — Progress Notes (Signed)
.  Pt presents for chronic care management   -Liposuction surgery completed on 10/27, pt states that incision area is seeping yellowish color pus and would like provider to look at it. Questioned pt if she contacted surgeon, states yes, he advised to keep area clean and put neosporin on the area.

## 2022-10-17 ENCOUNTER — Other Ambulatory Visit: Payer: Self-pay | Admitting: Family

## 2022-10-17 DIAGNOSIS — I10 Essential (primary) hypertension: Secondary | ICD-10-CM

## 2022-10-18 NOTE — Telephone Encounter (Signed)
Requested Prescriptions  Pending Prescriptions Disp Refills   hydrochlorothiazide (HYDRODIURIL) 12.5 MG tablet [Pharmacy Med Name: HYDROCHLOROTHIAZIDE 12.5 MG TB] 90 tablet 1    Sig: TAKE 1 TABLET BY MOUTH EVERY DAY     Cardiovascular: Diuretics - Thiazide Failed - 10/17/2022  8:46 AM      Failed - Na in normal range and within 180 days    Sodium  Date Value Ref Range Status  06/21/2022 146 (H) 134 - 144 mmol/L Final         Passed - Cr in normal range and within 180 days    Creatinine, Ser  Date Value Ref Range Status  06/21/2022 0.71 0.57 - 1.00 mg/dL Final         Passed - K in normal range and within 180 days    Potassium  Date Value Ref Range Status  06/21/2022 4.7 3.5 - 5.2 mmol/L Final         Passed - Last BP in normal range    BP Readings from Last 1 Encounters:  09/20/22 125/79         Passed - Valid encounter within last 6 months    Recent Outpatient Visits           4 weeks ago Benign essential hypertension   [facility] at [facility], Amy J, NP   3 months ago Essential (primary) hypertension   Primary Care at [facility], Amy J, NP   6 months ago Encounter for weight management   Primary Care at [facility], Amy J, NP   8 months ago Essential (primary) hypertension   Primary Care at [facility], Amy J, NP   11 months ago Essential (primary) hypertension   Primary Care at [facility], Flonnie Hailstone, NP       Future Appointments             In 2 months Camillia Herter, NP [facility]

## 2022-12-13 NOTE — Progress Notes (Signed)
Patient ID: Ashley Mcconnell, female    DOB: 01/28/1961  MRN: [id number]  CC: Chronic Care Management   Subjective: Ashley Mcconnell is a 62 y.o. female who presents for chronic care management.   Her concerns today include:  - Doing well on blood pressure medication, no issues/concerns.. Home blood pressure 130's/90's. She denies red flag symptoms such as but not limited to chest pain, shortness of breath, worst headache of life, nausea/vomiting.  - Doing well on diabetes medication. Blood sugars 120's - 180's. She is monitoring what she eats. Notices feeling tired in the mornings when her blood sugars are on the high end. Also, feels tired in the afternoons sometimes after lunch. She is not ready for referral to Endocrinology for further workup as of present. She is aware to notify me if she would like a referral in the future.  - Doing well on cholesterol medication, no issues/concerns. - Frequent urination with intermittent right upper abdominal discomfort. She denies associated red flag symptoms.  - Reports she recently had an MRI of the brain and MRI cervical spine related to her worker's compensation case. States the worker's compensation doctor told her that she needs a referral to Neurology related to an abnormality seen on the brain MRI. States she thinks he found a cyst and possibly something at the hypothalamus but she isn't sure. She endorses chronic headaches and feeling of "head pressure". She denies associated red flag symptoms. She would like to be referred to Metta Clines, DO.    Patient Active Problem List   Diagnosis Date Noted   Genetic testing 06/02/2022   Family history of breast cancer 03/29/2022   Breast pain 03/29/2022   Vitamin D deficiency 11/17/2021   Neck pain 10/01/2021   Pain in joint of right shoulder 09/30/2021   Lump in lower inner quadrant of right breast 08/25/2021   COVID 05/30/2021   Bacterial vaginitis 04/28/2021   Obesity (BMI 30-39.9) 01/27/2021   Chest  pain of uncertain etiology [id number]   Diabetes mellitus due to underlying condition with hyperosmolarity without coma, without long-term current use of insulin ([place]) 10/31/2020   Abdominal pain 10/29/2020   Accumulation of fluid in tissues 10/29/2020   Arthralgia of lower leg 10/29/2020   Blood in feces 10/29/2020   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   Can't get food down 10/29/2020   Fatigue 10/29/2020   Hemorrhoids, internal 10/29/2020   History of colon polyps 10/29/2020   Infection of the upper respiratory tract 10/29/2020   Interdigital neuralgia 10/29/2020   Pulmonary embolism ([place])    Morton's neuroma    Migraine    Hypertension    Cardiomyopathy ([place])    Type 2 diabetes mellitus ([place]) 10/25/2020   Hyperlipemia 10/25/2020   Blood in urine 07/14/2020   Benign essential hypertension 06/01/2018   Family history of colon cancer requiring screening colonoscopy 02/09/2018   Family history of colonic polyps 02/09/2018   Pre-diabetes 11/10/2017   Hyperglycemia 11/09/2017   Acute bronchitis 03/15/2016   Chill 12/10/2015   FOM (frequency of micturition) 12/10/2015   Dyspnea 07/25/2015   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   GERD (gastroesophageal reflux disease) 07/25/2015   Chest wall pain 07/25/2015   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   Left flank pain 02/06/2013   Leukocytosis 02/06/2013   Nephrolithiasis 02/06/2013     Current Outpatient Medications on File Prior to Visit  Medication Sig Dispense Refill   Blood Glucose Monitoring Suppl (TRUE METRIX METER) w/Device KIT Use as directed 1 kit 0  cyclobenzaprine (FLEXERIL) 5 MG tablet Take 5 mg by mouth at bedtime.     diclofenac (CATAFLAM) 50 MG tablet Take 50 mg by mouth 3 (three) times daily as needed. (Patient not taking: Reported on 04/21/2022)     glucose blood (TRUE METRIX BLOOD GLUCOSE TEST) test strip Use as instructed 100 each 12   hydrochlorothiazide (HYDRODIURIL) 12.5 MG tablet TAKE 1 TABLET  BY MOUTH EVERY DAY 90 tablet 1   mupirocin ointment (BACTROBAN) 2 % Apply 1 application topically 2 (two) times daily. 22 g 0   ondansetron (ZOFRAN-ODT) 8 MG disintegrating tablet Take 1 tablet (8 mg total) by mouth every 8 (eight) hours as needed for nausea or vomiting. 30 tablet 1   pantoprazole (PROTONIX) 40 MG tablet TAKE 1 TABLET BY MOUTH EVERY DAY 90 tablet 1   triamcinolone ointment (KENALOG) 0.1 % Apply 1 application topically 2 (two) times daily as needed (itching). 80 g 1   TRUEplus Lancets 28G MISC Use as directed 100 each 4   No current facility-administered medications on file prior to visit.    Allergies  Allergen Reactions   Fish Allergy Rash    [name] is only fish she does not have allergy to   Iodinated Contrast Media Other (See Comments)    Rash  Rash    Shellfish Allergy Itching and Rash   Sulfamethoxazole-Trimethoprim Itching and Swelling    Swelling tongue and itching mouth. Swelling tongue and itching mouth.     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    Occupation: Mudlogger  Tobacco Use   Smoking status: Former    Packs/day: 0.20    Years: 2.00    Total pack years: 0.40    Types: Cigarettes    Quit date: 07/24/1986    Years since quitting: 36.4    Passive exposure: Past   Smokeless tobacco: Never   Tobacco comments:    social smoker  Vaping Use   Vaping Use: Never used  Substance and Sexual Activity   Alcohol use: Yes    Alcohol/week: 0.0 standard drinks of alcohol    Comment: 1 drink on New Year's Eve   Drug use: No   Sexual activity: Not Currently    Birth control/protection: Surgical  Other Topics Concern   Not on file  Social History Narrative   Patient lives at home with her family.   Caffeine  Use: 1 cup of tea two times weekly   Social Determinants of Health   Financial Resource Strain: Not on file  Food Insecurity: Not on file  Transportation Needs: Not on file  Physical Activity: Not on file  Stress: Not on file  Social Connections: Not on file  Intimate Partner Violence: Not on file    Family History  Problem Relation Age of Onset   Breast cancer Mother        limited info   Stroke Father    Heart Problems Father        heart bypass   Melanoma Father        x2-3   Cancer Sister 94       colorectal   Alzheimer's disease Paternal Aunt    Alzheimer's disease Paternal Aunt  Alzheimer's disease Paternal Aunt    Cancer Paternal Aunt        unk type   Alzheimer's disease Paternal Uncle    Heart Problems Paternal Grandmother        enlarged heart   Testicular cancer Son    Stomach cancer Neg Hx    Colon cancer Neg Hx    Esophageal cancer Neg Hx     Past Surgical History:  Procedure Laterality Date   ABDOMINAL HYSTERECTOMY     CARDIAC SURGERY  04/10/2014   Catherization    CHOLECYSTECTOMY     COLONOSCOPY     CYSTOSCOPY/URETEROSCOPY/HOLMIUM LASER/STENT PLACEMENT Left 10/10/2018   Procedure: CYSTOSCOPY/URETEROSCOPY/HOLMIUM LASER/STENT PLACEMENT;  Surgeon: Ceasar Mons, MD;  Location: [facility];  Service: Urology;  Laterality: Left;   LIPOSUCTION  07/2022   LITHOTRIPSY     x5   TONSILLECTOMY  10/12/1967   UPPER GASTROINTESTINAL ENDOSCOPY      ROS: Review of Systems Negative except as stated above  PHYSICAL EXAM: BP 117/79 (BP Location: Left Arm, Patient Position: Sitting, Cuff Size: Large)   Pulse 91   Temp 98.3 F (36.8 C)   Resp 16   Ht '5\' 5"'$  (1.651 m)   Wt 220 lb (99.8 kg)   SpO2 93%   BMI 36.61 kg/m   Physical Exam HENT:     Head: Normocephalic and atraumatic.  Eyes:     Extraocular Movements: Extraocular movements intact.     Conjunctiva/sclera: Conjunctivae normal.     Pupils: Pupils are equal, round, and reactive to  light.  Cardiovascular:     Rate and Rhythm: Normal rate and regular rhythm.     Pulses: Normal pulses.     Heart sounds: Normal heart sounds.  Pulmonary:     Effort: Pulmonary effort is normal.     Breath sounds: Normal breath sounds.  Musculoskeletal:     Cervical back: Normal range of motion and neck supple.  Neurological:     General: No focal deficit present.     Mental Status: She is alert and oriented to person, place, and time.  Psychiatric:        Mood and Affect: Mood normal.        Behavior: Behavior normal.     Results for orders placed or performed in visit on 12/20/22  POCT glycosylated hemoglobin (Hb A1C)  Result Value Ref Range   Hemoglobin A1C     HbA1c POC (<> result, manual entry)     HbA1c, POC (prediabetic range)     HbA1c, POC (controlled diabetic range) 6.8 0.0 - 7.0 %  POCT URINALYSIS DIP (CLINITEK)  Result Value Ref Range   Color, UA yellow yellow   Clarity, UA clear clear   Glucose, UA negative negative mg/dL   Bilirubin, UA negative negative   Ketones, POC UA negative negative mg/dL   Spec Grav, UA >=1.030 (A) 1.010 - 1.025   Blood, UA small (A) negative   pH, UA 6.0 5.0 - 8.0   POC PROTEIN,UA negative negative, trace   Urobilinogen, UA 0.2 0.2 or 1.0 E.U./dL   Nitrite, UA Positive (A) Negative   Leukocytes, UA Small (1+) (A) Negative    ASSESSMENT AND PLAN: 1. Primary hypertension - Continue Hydrochlorothiazide as prescribed. No refills needed as of present.  - Counseled on blood pressure goal of less than 130/80, low-sodium, DASH diet, medication compliance, and 150 minutes of moderate intensity exercise per week as tolerated. Counseled on medication adherence and adverse effects. - Keep  annual scheduled appointment with Cardiology.  - Follow-up with primary provider in 6 months or sooner if needed.   2. Type 2 diabetes mellitus without complication, without long-term current use of insulin (HCC) - Hemoglobin A1c at goal at 6.8%, goal 7%.  This is increased from previous 6.3%.  - Continue Metformin as prescribed.  - Discussed the importance of healthy eating habits, low-carbohydrate diet, low-sugar diet, regular aerobic exercise (at least 150 minutes a week as tolerated) and medication compliance to achieve or maintain control of diabetes. - Follow-up with primary provider in 6 months or sooner if needed.  - POCT glycosylated hemoglobin (Hb A1C) - metFORMIN (GLUCOPHAGE) 500 MG tablet; Take 1 tablet (500 mg total) by mouth 2 (two) times daily with a meal.  Dispense: 60 tablet; Refill: 2  3. Hyperlipidemia, unspecified hyperlipidemia type - Continue Atorvastatin as prescribed.  - Follow-up with primary provider in 6 months or sooner if needed.  - atorvastatin (LIPITOR) 20 MG tablet; Take 1 tablet (20 mg total) by mouth daily.  Dispense: 30 tablet; Refill: 2  4. Frequent urination 5. Acute cystitis with hematuria - Routine screening.  - Positive urinary tract infection. Amoxicillin-Clavulanate as prescribed. - Follow-up with primary provider as scheduled. - POCT URINALYSIS DIP (CLINITEK) - Cervicovaginal ancillary only - amoxicillin-clavulanate (AUGMENTIN) 875-125 MG tablet; Take 1 tablet by mouth 2 (two) times daily for 5 days.  Dispense: 10 tablet; Refill: 0  6. Chronic nonintractable headache, unspecified headache type 7. Pressure in head - Referral to Neurology for further evaluation/management.  - Ambulatory referral to Neurology   Patient was given the opportunity to ask questions.  Patient verbalized understanding of the plan and was able to repeat key elements of the plan. Patient was given clear instructions to go to Emergency Department or return to medical center if symptoms don't improve, worsen, or new problems develop.The patient verbalized understanding.   Orders Placed This Encounter  Procedures   Ambulatory referral to Neurology   POCT glycosylated hemoglobin (Hb A1C)   POCT URINALYSIS DIP (CLINITEK)      Requested Prescriptions   Signed Prescriptions Disp Refills   atorvastatin (LIPITOR) 20 MG tablet 30 tablet 2    Sig: Take 1 tablet (20 mg total) by mouth daily.   metFORMIN (GLUCOPHAGE) 500 MG tablet 60 tablet 2    Sig: Take 1 tablet (500 mg total) by mouth 2 (two) times daily with a meal.   amoxicillin-clavulanate (AUGMENTIN) 875-125 MG tablet 10 tablet 0    Sig: Take 1 tablet by mouth 2 (two) times daily for 5 days.    Return in about 6 months (around 06/22/2023) for Follow-Up or next available chronic care mgmt .  Camillia Herter, NP

## 2022-12-20 ENCOUNTER — Ambulatory Visit: Payer: Managed Care, Other (non HMO) | Admitting: Family

## 2022-12-20 ENCOUNTER — Other Ambulatory Visit (HOSPITAL_COMMUNITY)
Admission: RE | Admit: 2022-12-20 | Discharge: 2022-12-20 | Disposition: A | Discharge status: Home / Self Care | Payer: Managed Care, Other (non HMO) | Source: Ambulatory Visit | Attending: Family | Admitting: Family

## 2022-12-20 ENCOUNTER — Encounter: Payer: Self-pay | Admitting: Family

## 2022-12-20 VITALS — BP 117/79 | HR 91 | Temp 98.3°F | Resp 16 | Ht 65.0 in | Wt 220.0 lb

## 2022-12-20 DIAGNOSIS — E119 Type 2 diabetes mellitus without complications: Secondary | ICD-10-CM

## 2022-12-20 DIAGNOSIS — I10 Essential (primary) hypertension: Secondary | ICD-10-CM

## 2022-12-20 DIAGNOSIS — R350 Frequency of micturition: Secondary | ICD-10-CM

## 2022-12-20 DIAGNOSIS — R519 Headache, unspecified: Secondary | ICD-10-CM | POA: Diagnosis not present

## 2022-12-20 DIAGNOSIS — N3001 Acute cystitis with hematuria: Secondary | ICD-10-CM

## 2022-12-20 DIAGNOSIS — E785 Hyperlipidemia, unspecified: Secondary | ICD-10-CM | POA: Diagnosis not present

## 2022-12-20 DIAGNOSIS — G8929 Other chronic pain: Secondary | ICD-10-CM

## 2022-12-20 LAB — POCT URINALYSIS DIP (CLINITEK)
Bilirubin, UA: NEGATIVE
Glucose, UA: NEGATIVE mg/dL
Ketones, POC UA: NEGATIVE mg/dL
Nitrite, UA: POSITIVE — AB
POC PROTEIN,UA: NEGATIVE
Spec Grav, UA: >=1.03 — AB (ref 1.010–1.025)
Urobilinogen, UA: 0.2 E.U./dL
pH, UA: 6 (ref 5.0–8.0)

## 2022-12-20 LAB — POCT GLYCOSYLATED HEMOGLOBIN (HGB A1C): HbA1c, POC (controlled diabetic range): 6.8 % (ref 0.0–7.0)

## 2022-12-20 MED ORDER — AMOXICILLIN-POT CLAVULANATE 875-125 MG PO TABS: 1.0000 | ORAL_TABLET | Freq: Two times a day (BID) | ORAL | 0 refills | Status: AC

## 2022-12-20 MED ORDER — METFORMIN HCL 500 MG PO TABS: 500.0000 mg | ORAL_TABLET | Freq: Two times a day (BID) | ORAL | 2 refills | Status: DC

## 2022-12-20 MED ORDER — ATORVASTATIN CALCIUM 20 MG PO TABS: 20.0000 mg | ORAL_TABLET | Freq: Every day | ORAL | 2 refills | Status: DC

## 2022-12-20 NOTE — Progress Notes (Signed)
.  Pt presents for chronic care management   -frequent urination  -needs referral to Neurology per workers comp doctor, req to be referred to Dr. Tomi Likens

## 2022-12-21 ENCOUNTER — Other Ambulatory Visit: Payer: Self-pay | Admitting: Family

## 2022-12-21 DIAGNOSIS — N760 Acute vaginitis: Secondary | ICD-10-CM

## 2022-12-21 LAB — CERVICOVAGINAL ANCILLARY ONLY
Bacterial Vaginitis (gardnerella): POSITIVE — AB
Candida Glabrata: NEGATIVE
Candida Vaginitis: NEGATIVE
Chlamydia: NEGATIVE
Comment: NEGATIVE
Comment: NEGATIVE
Comment: NEGATIVE
Comment: NEGATIVE
Comment: NEGATIVE
Comment: NORMAL
Neisseria Gonorrhea: NEGATIVE
Trichomonas: NEGATIVE

## 2022-12-21 MED ORDER — METRONIDAZOLE 500 MG PO TABS: 500.0000 mg | ORAL_TABLET | Freq: Two times a day (BID) | ORAL | 0 refills | Status: AC

## 2022-12-23 ENCOUNTER — Other Ambulatory Visit: Payer: Self-pay | Admitting: Family

## 2022-12-23 DIAGNOSIS — N3289 Other specified disorders of bladder: Secondary | ICD-10-CM

## 2022-12-23 DIAGNOSIS — N3281 Overactive bladder: Secondary | ICD-10-CM

## 2022-12-24 NOTE — Telephone Encounter (Signed)
Requested medication (s) are due for refill today: routing for review  Requested medication (s) are on the active medication list: no  Last refill:  06/21/22  Future visit scheduled: yes  Notes to clinic:  Unable to refill per protocol, Rx expired. Medication is not on the current medication list, routing for review.      Requested Prescriptions  Pending Prescriptions Disp Refills   oxybutynin (DITROPAN-XL) 10 MG 24 hr tablet [Pharmacy Med Name: OXYBUTYNIN CL ER 10 MG TABLET] 90 tablet     Sig: TAKE 1 TABLET BY [place]     Urology:  Bladder Agents Passed - 12/23/2022  4:19 PM      Passed - Valid encounter within last 12 months    Recent Outpatient Visits           4 days ago Primary hypertension   [facility], Amy J, NP   3 months ago Benign essential hypertension   [facility], Amy J, NP   6 months ago Essential (primary) hypertension   [facility], Amy J, NP   8 months ago Encounter for weight management   [facility], [place], NP   10 months ago Essential (primary) hypertension   [facility] [facility], Flonnie Hailstone, NP       Future Appointments             In 1 week Pieter Partridge, [facility]   In 6 months Camillia Herter, NP [place] at [facility]

## 2022-12-31 ENCOUNTER — Ambulatory Visit (INDEPENDENT_AMBULATORY_CARE_PROVIDER_SITE_OTHER): Payer: Managed Care, Other (non HMO) | Admitting: Family

## 2022-12-31 ENCOUNTER — Encounter: Payer: Self-pay | Admitting: Family

## 2022-12-31 DIAGNOSIS — H01004 Unspecified blepharitis left upper eyelid: Secondary | ICD-10-CM

## 2022-12-31 MED ORDER — ERYTHROMYCIN 5 MG/GM OP OINT: 1.0000 | TOPICAL_OINTMENT | Freq: Every day | OPHTHALMIC | 0 refills | Status: AC

## 2022-12-31 NOTE — Telephone Encounter (Signed)
Appt completed

## 2022-12-31 NOTE — Progress Notes (Signed)
Virtual Visit via Telephone Note  I connected with [name], on 12/31/2022 at 10:26 AM by telephone and verified that I am speaking with the correct person using two identifiers.  Consent: I discussed the limitations, risks, security and privacy concerns of performing an evaluation and management service by telephone and the availability of in person appointments. I also discussed with the patient that there may be a patient responsible charge related to this service. The patient expressed understanding and agreed to proceed.   Location of Patient: Home  Location of Provider: [facility] [place] participating in Telemedicine visit: Makenah Berkley Harvey, NP Elmon Else, CMA  History of Present Illness: Ashley Mcconnell is a 62 y.o. female who presents for left eye irritation x 2 days. She considered if this is related to recent Metronidazole prescription she was taking for bacterial vaginitis treatment. I discussed with patient in detail this is unlikely due to allergic reactions from medications usually occur within hours shortly after ingestion and she verbalized understanding. Reports since began left eye irritation has improved some. She has tried over-the-counter allergy medication, Benadryl, eye drops, and compresses to help. She has normal vision of left eye and denies red flag symptoms. No further issues/concerns for discussion today.    Past Medical History:  Diagnosis Date   Cardiomyopathy ([place])    Diabetes ([place])    H/O blood clots    History of colon polyps    Hypertension    Phreesia 09/26/2020   Kidney stones    Migraine    Morton's neuroma    L Foot   Pulmonary embolism (HCC)    Status post dilation of esophageal narrowing    Vitamin D deficiency    Allergies  Allergen Reactions   Fish Allergy Rash    [name] is only fish she does not have allergy to   Iodinated Contrast Media Other (See Comments)    Rash  Rash     Shellfish Allergy Itching and Rash   Sulfamethoxazole-Trimethoprim Itching and Swelling    Swelling tongue and itching mouth. Swelling tongue and itching mouth.     Current Outpatient Medications on File Prior to Visit  Medication Sig Dispense Refill   atorvastatin (LIPITOR) 20 MG tablet Take 1 tablet (20 mg total) by mouth daily. 30 tablet 2   Blood Glucose Monitoring Suppl (TRUE METRIX METER) w/Device KIT Use as directed 1 kit 0   cyclobenzaprine (FLEXERIL) 5 MG tablet Take 5 mg by mouth at bedtime.     diclofenac (CATAFLAM) 50 MG tablet Take 50 mg by mouth 3 (three) times daily as needed. (Patient not taking: Reported on 04/21/2022)     glucose blood (TRUE METRIX BLOOD GLUCOSE TEST) test strip Use as instructed 100 each 12   hydrochlorothiazide (HYDRODIURIL) 12.5 MG tablet TAKE 1 TABLET BY MOUTH EVERY DAY 90 tablet 1   metFORMIN (GLUCOPHAGE) 500 MG tablet Take 1 tablet (500 mg total) by mouth 2 (two) times daily with a meal. 60 tablet 2   mupirocin ointment (BACTROBAN) 2 % Apply 1 application topically 2 (two) times daily. 22 g 0   ondansetron (ZOFRAN-ODT) 8 MG disintegrating tablet Take 1 tablet (8 mg total) by mouth every 8 (eight) hours as needed for nausea or vomiting. 30 tablet 1   oxybutynin (DITROPAN-XL) 10 MG 24 hr tablet TAKE 1 TABLET BY MOUTH EVERY DAY 90 tablet 0   pantoprazole (PROTONIX) 40 MG tablet TAKE 1 TABLET BY MOUTH EVERY DAY 90  tablet 1   triamcinolone ointment (KENALOG) 0.1 % Apply 1 application topically 2 (two) times daily as needed (itching). 80 g 1   TRUEplus Lancets 28G MISC Use as directed 100 each 4   No current facility-administered medications on file prior to visit.    Observations/Objective: Alert and oriented x 3. Not in acute distress. Physical examination not completed as this is a telemedicine visit.  Assessment and Plan: 1. Blepharitis of left upper eyelid, unspecified type - Erythromycin ophthalmic ointment as prescribed. Counseled on medication  adherence.  - Follow-up with primary provider as scheduled.  - erythromycin ophthalmic ointment; Place 1 Application into the left eye at bedtime for 7 days.  Dispense: 7 g; Refill: 0   Follow Up Instructions: Follow-up with primary provider as scheduled.    Patient was given clear instructions to go to Emergency Department or return to medical center if symptoms don't improve, worsen, or new problems develop.The patient verbalized understanding.  I discussed the assessment and treatment plan with the patient. The patient was provided an opportunity to ask questions and all were answered. The patient agreed with the plan and demonstrated an understanding of the instructions.   The patient was advised to call back or seek an in-person evaluation if the symptoms worsen or if the condition fails to improve as anticipated.   I provided 5 minutes total of non-face-to-face time during this encounter.   Camillia Herter, NP  [facility] [place], [place] 12/31/2022, 10:26 AM

## 2023-01-03 NOTE — Progress Notes (Unsigned)
NEUROLOGY CONSULTATION NOTE  Ashley Mcconnell MRN: [id number] DOB: February 07, 1961  Referring provider: Durene Fruits, NP Primary care provider: Durene Fruits, NP  Reason for consult:  headache  Assessment/Plan:   Chronic tension type headache complicated by myofascial cervical pain MRI findings not concerning - mild chronic small vessel ischemic changes in setting of hypertension, hyperlipidemia and diabetes.  Partially empty sella is normal variant.  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 Tremor likely related to pain in arm and spasms in the neck Right arm/hand numbness - may be atypical presentation of carpal tunnel syndrome which was the only finding on NCV-EMG.  No evidence of C8 radiculopathy or ulnar neuropathy.  MRI does not reveal any obvious cause for a C8-T1 radiculopathy.   She will start cyclobenzaprine 5-10mg  at bedtime for neck spasms.  Consider gabapentin or nortriptyline in the future.   Plan is to follow up with ortho regarding shoulder.  Should be restarting physical therapy soon Advised to try wearing splint on right wrist to see if symptoms improve.  Subjective:  Ashley Mcconnell is a 62 year old female with HTN, HLD, DM, cardiomyopathy, and history of pulmonary emboli and kidney stones who presents for headaches.  History supplemented by referring provider's note.  MRI of brain and cervical spine personally reviewed.  She has hypertension.  Blood pressure at home tends to run 130s/90s.  About 16 months ago, she had a work-related accident in which she grabbed a baby car seat and it fell on her and lost consciousness.  Can't remember if it hit her.  She has had head pressure, neck pain and radicular pain in the right arm with tingling in the last 2 fingers.  If she raises her right arm above the shoulder causes pain in the shoulder and tremor in hand and head.  Pressure behind the right side of head.  Right after the accident, she underwent PT for 3-4 months.  Imaging of  the right shoulder showed mild tears but nothing requiring surgery.  She does plan to return to ortho.   She had a MRI of brain without contrast on 10/01/2022 which revealed mild chronic small vessel ischemic changes as well as partially empty sella and 7 mm pineal cyst.  MRI of cervical spine showed multilevel degenerative changes with shallow disc bulges at C4-5, C5-6 and C7-T1 without significant spinal or foraminal stenosis and grade 1 anterolisthesis at C6-7 and C7-T1.  NCV-EMG of upper extremities on 10/26/2022 showed evidence of mild right carpal tunnel syndrome.    Past NSAIDS/analgesics:  Excedrin Migraine, diclofenac 50mg , ketorolac Past abortive triptans:  none Past abortive ergotamine:  none Past muscle relaxants:  Robaxin Past anti-emetic:  none Past antihypertensive medications:  none Past antidepressant medications:  amitriptyline Past anticonvulsant medications:  none Past anti-CGRP:  none Past vitamins/Herbal/Supplements:  none Past antihistamines/decongestants:  Zyrtec, Benadryl, Sudafed Other past therapies:  none  Current NSAIDS/analgesics:  none Current triptans:  none Current ergotamine:  none Current anti-emetic:  Zofran ODT 8mg  Current muscle relaxants:  Flexeril 5mg  QHS Current Antihypertensive medications:  HCTZ Current Antidepressant medications:  none Current Anticonvulsant medications:  none Current anti-CGRP:  none Current Vitamins/Herbal/Supplements:  none Current Antihistamines/Decongestants:  none Other therapy:  none   PAST MEDICAL HISTORY: Past Medical History:  Diagnosis Date   Cardiomyopathy ([place])    Diabetes ([place])    H/O blood clots    History of colon polyps    Hypertension    Phreesia 09/26/2020   Kidney stones  Migraine    Morton's neuroma    L Foot   Pulmonary embolism ([place])    Status post dilation of esophageal narrowing    Vitamin D deficiency     PAST SURGICAL HISTORY: Past Surgical History:  Procedure Laterality Date    ABDOMINAL HYSTERECTOMY     CARDIAC SURGERY  04/10/2014   Catherization    CHOLECYSTECTOMY     COLONOSCOPY     CYSTOSCOPY/URETEROSCOPY/HOLMIUM LASER/STENT PLACEMENT Left 10/10/2018   Procedure: CYSTOSCOPY/URETEROSCOPY/HOLMIUM LASER/STENT PLACEMENT;  Surgeon: Ceasar Mons, MD;  Location: [facility];  Service: Urology;  Laterality: Left;   LIPOSUCTION  07/2022   LITHOTRIPSY     x5   TONSILLECTOMY  10/12/1967   UPPER GASTROINTESTINAL ENDOSCOPY      MEDICATIONS: Current Outpatient Medications on File Prior to Visit  Medication Sig Dispense Refill   atorvastatin (LIPITOR) 20 MG tablet Take 1 tablet (20 mg total) by mouth daily. 30 tablet 2   Blood Glucose Monitoring Suppl (TRUE METRIX METER) w/Device KIT Use as directed 1 kit 0   cyclobenzaprine (FLEXERIL) 5 MG tablet Take 5 mg by mouth at bedtime.     diclofenac (CATAFLAM) 50 MG tablet Take 50 mg by mouth 3 (three) times daily as needed. (Patient not taking: Reported on 04/21/2022)     erythromycin ophthalmic ointment Place 1 Application into the left eye at bedtime for 7 days. 7 g 0   glucose blood (TRUE METRIX BLOOD GLUCOSE TEST) test strip Use as instructed 100 each 12   hydrochlorothiazide (HYDRODIURIL) 12.5 MG tablet TAKE 1 TABLET BY MOUTH EVERY DAY 90 tablet 1   metFORMIN (GLUCOPHAGE) 500 MG tablet Take 1 tablet (500 mg total) by mouth 2 (two) times daily with a meal. 60 tablet 2   mupirocin ointment (BACTROBAN) 2 % Apply 1 application topically 2 (two) times daily. 22 g 0   ondansetron (ZOFRAN-ODT) 8 MG disintegrating tablet Take 1 tablet (8 mg total) by mouth every 8 (eight) hours as needed for nausea or vomiting. 30 tablet 1   oxybutynin (DITROPAN-XL) 10 MG 24 hr tablet TAKE 1 TABLET BY MOUTH EVERY DAY 90 tablet 0   pantoprazole (PROTONIX) 40 MG tablet TAKE 1 TABLET BY MOUTH EVERY DAY 90 tablet 1   triamcinolone ointment (KENALOG) 0.1 % Apply 1 application topically 2 (two) times daily as needed (itching). 80 g 1    TRUEplus Lancets 28G MISC Use as directed 100 each 4   No current facility-administered medications on file prior to visit.    ALLERGIES: Allergies  Allergen Reactions   Fish Allergy Rash    [name] is only fish she does not have allergy to   Iodinated Contrast Media Other (See Comments)    Rash  Rash    Shellfish Allergy Itching and Rash   Sulfamethoxazole-Trimethoprim Itching and Swelling    Swelling tongue and itching mouth. Swelling tongue and itching mouth.     FAMILY HISTORY: Family History  Problem Relation Age of Onset   Breast cancer Mother        limited info   Stroke Father    Heart Problems Father        heart bypass   Melanoma Father        x2-3   Cancer Sister 68       colorectal   Alzheimer's disease Paternal Aunt    Alzheimer's disease Paternal Aunt    Alzheimer's disease Paternal Aunt    Cancer Paternal Aunt        unk  type   Alzheimer's disease Paternal Uncle    Heart Problems Paternal Grandmother        enlarged heart   Testicular cancer Son    Stomach cancer Neg Hx    Colon cancer Neg Hx    Esophageal cancer Neg Hx     Objective:  Blood pressure (!) 149/87, pulse 86, resp. rate 20, height 5\' 5"  (1.651 m), weight 218 lb (98.9 kg), SpO2 97 %. General: No acute distress.  Patient appears well-groomed.   Head:  Normocephalic/atraumatic Eyes:  fundi examined but not visualized Neck: supple, right sided tenderness, full range of motion Back: No paraspinal tenderness Heart: regular rate and rhythm Lungs: Clear to auscultation bilaterally. Vascular: No carotid bruits. Neurological Exam: Mental status: alert and oriented to person, place, and time, speech fluent and not dysarthric, language intact. Cranial nerves: CN I: not tested CN II: pupils equal, round and reactive to light, visual fields intact CN III, IV, VI:  full range of motion, no nystagmus, no ptosis CN V: facial sensation intact. CN VII: upper and lower face symmetric CN VIII:  hearing intact CN IX, X: gag intact, uvula midline CN XI: sternocleidomastoid and trapezius muscles intact CN XII: tongue midline Bulk & Tone: normal, no fasciculations. Motor:  muscle strength 5/5 throughout Sensation:  Pinprick, temperature and vibratory sensation intact. Deep Tendon Reflexes:  2+ throughout,  toes downgoing.   Finger to nose testing:  Without dysmetria.   Heel to shin:  Without dysmetria.   Gait:  Normal station and stride.  Romberg negative.    Thank you for allowing me to take part in the care of this patient.  Metta Clines, DO  CC: Durene Fruits, NP

## 2023-01-04 ENCOUNTER — Encounter: Payer: Self-pay | Admitting: Neurology

## 2023-01-04 ENCOUNTER — Ambulatory Visit: Payer: Managed Care, Other (non HMO) | Admitting: Neurology

## 2023-01-04 VITALS — BP 149/87 | HR 86 | Resp 20 | Ht 65.0 in | Wt 218.0 lb

## 2023-01-04 DIAGNOSIS — M542 Cervicalgia: Secondary | ICD-10-CM

## 2023-01-04 DIAGNOSIS — G44229 Chronic tension-type headache, not intractable: Secondary | ICD-10-CM | POA: Diagnosis not present

## 2023-01-04 DIAGNOSIS — G5601 Carpal tunnel syndrome, right upper limb: Secondary | ICD-10-CM | POA: Diagnosis not present

## 2023-01-04 MED ORDER — CYCLOBENZAPRINE HCL 10 MG PO TABS: ORAL_TABLET | ORAL | 5 refills | Status: DC

## 2023-01-04 NOTE — Patient Instructions (Signed)
Take the cyclobenzaprine at bedtime Follow up with ortho and hopefully physical therapy Try wearing wrist splint on right wrist at night (can get at any pharmacy) Follow up 5 months.

## 2023-01-07 ENCOUNTER — Encounter: Payer: Self-pay | Admitting: Family

## 2023-01-10 ENCOUNTER — Other Ambulatory Visit: Payer: Self-pay | Admitting: Family

## 2023-01-10 DIAGNOSIS — E119 Type 2 diabetes mellitus without complications: Secondary | ICD-10-CM

## 2023-01-10 NOTE — Telephone Encounter (Signed)
Complete

## 2023-01-20 ENCOUNTER — Ambulatory Visit: Payer: Managed Care, Other (non HMO)

## 2023-01-20 ENCOUNTER — Ambulatory Visit
Admission: EM | Admit: 2023-01-20 | Discharge: 2023-01-20 | Disposition: A | Discharge status: Home / Self Care | Payer: Managed Care, Other (non HMO) | Attending: Physician Assistant | Admitting: Physician Assistant

## 2023-01-20 DIAGNOSIS — J209 Acute bronchitis, unspecified: Secondary | ICD-10-CM

## 2023-01-20 MED ORDER — DOXYCYCLINE HYCLATE 100 MG PO CAPS: 100.0000 mg | ORAL_CAPSULE | Freq: Two times a day (BID) | ORAL | 0 refills | Status: DC

## 2023-01-20 MED ORDER — ALBUTEROL SULFATE HFA 108 (90 BASE) MCG/ACT IN AERS: 2.0000 | INHALATION_SPRAY | Freq: Four times a day (QID) | RESPIRATORY_TRACT | 2 refills | Status: DC | PRN

## 2023-01-20 NOTE — ED Triage Notes (Addendum)
Pt states cough and congestion for one week. Has been taking dayquil and nyquil at home with some relief.  States she took two home covid tests that were negative.

## 2023-01-20 NOTE — Discharge Instructions (Addendum)
Return if any problems.  See your Physician for recheck in one week  °

## 2023-01-20 NOTE — ED Provider Notes (Signed)
[facility]    CSN: [id number] Arrival date & time: 01/20/23  0800      History   Chief Complaint Chief Complaint  Patient presents with   Cough    HPI Ashley Mcconnell is a 62 y.o. female.   Pt complains of a cough and shortness of breath.    The history is provided by the patient. No language interpreter was used.  Cough Cough characteristics:  Non-productive Sputum characteristics:  Nondescript Severity:  Moderate Onset quality:  Gradual Timing:  Constant Progression:  Worsening Chronicity:  New Context: not upper respiratory infection   Relieved by:  Nothing Worsened by:  Nothing Ineffective treatments:  None tried Associated symptoms: no fever     Past Medical History:  Diagnosis Date   Cardiomyopathy    Diabetes    H/O blood clots    History of colon polyps    Hypertension    Phreesia 09/26/2020   Kidney stones    Migraine    Morton's neuroma    L Foot   Pulmonary embolism    Status post dilation of esophageal narrowing    Vitamin D deficiency     Patient Active Problem List   Diagnosis Date Noted   Genetic testing 06/02/2022   Family history of breast cancer 03/29/2022   Breast pain 03/29/2022   Vitamin D deficiency 11/17/2021   Neck pain 10/01/2021   Pain in joint of right shoulder 09/30/2021   Lump in lower inner quadrant of right breast 08/25/2021   COVID 05/30/2021   BV (bacterial vaginosis) 04/28/2021   Obesity (BMI 30-39.9) 01/27/2021   Chest pain of uncertain etiology 10/31/2020   Diabetes mellitus due to underlying condition with hyperosmolarity without coma, without long-term current use of insulin 10/31/2020   Abdominal pain 10/29/2020   Accumulation of fluid in tissues 10/29/2020   Arthralgia of lower leg 10/29/2020   Blood in feces 10/29/2020   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   Can't get food down 10/29/2020   Fatigue 10/29/2020   Hemorrhoids, internal 10/29/2020   History of colon polyps 10/29/2020    Infection of the upper respiratory tract 10/29/2020   Interdigital neuralgia 10/29/2020   Pulmonary embolism    Morton's neuroma    Migraine    Hypertension    Cardiomyopathy    Type 2 diabetes mellitus 10/25/2020   Hyperlipemia 10/25/2020   Blood in urine 07/14/2020   Benign essential hypertension 06/01/2018   Family history of colon cancer requiring screening colonoscopy 02/09/2018   Family history of colonic polyps 02/09/2018   Pre-diabetes 11/10/2017   Hyperglycemia 11/09/2017   Acute bronchitis 03/15/2016   Chill 12/10/2015   FOM (frequency of micturition) 12/10/2015   Dyspnea 07/25/2015   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   GERD (gastroesophageal reflux disease) 07/25/2015   Chest wall pain 07/25/2015   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   Left flank pain 02/06/2013   Leukocytosis 02/06/2013   Nephrolithiasis 02/06/2013    Past Surgical History:  Procedure Laterality Date   ABDOMINAL HYSTERECTOMY     CARDIAC SURGERY  04/10/2014   Catherization    CHOLECYSTECTOMY     COLONOSCOPY     CYSTOSCOPY/URETEROSCOPY/HOLMIUM LASER/STENT PLACEMENT Left 10/10/2018   Procedure: CYSTOSCOPY/URETEROSCOPY/HOLMIUM LASER/STENT PLACEMENT;  Surgeon: Rene Paci, MD;  Location: [facility];  Service: Urology;  Laterality: Left;   LIPOSUCTION  07/2022   LITHOTRIPSY     x5   TONSILLECTOMY  10/12/1967   UPPER GASTROINTESTINAL ENDOSCOPY      OB History  Gravida  3   Para      Term      Preterm      AB      Living  3      SAB      IAB      Ectopic      Multiple      Live Births  3            Home Medications    Prior to Admission medications   Medication Sig Start Date End Date Taking? Authorizing Provider  albuterol (VENTOLIN HFA) 108 (90 Base) MCG/ACT inhaler Inhale 2 puffs into the lungs every 6 (six) hours as needed for wheezing or shortness of breath. 01/20/23  Yes Cheron Schaumann K, PA-C  doxycycline (VIBRAMYCIN) 100 MG capsule  Take 1 capsule (100 mg total) by mouth 2 (two) times daily. 01/20/23  Yes Cheron Schaumann K, PA-C  atorvastatin (LIPITOR) 20 MG tablet Take 1 tablet (20 mg total) by mouth daily. 12/20/22 03/20/23  Rema Fendt, NP  Blood Glucose Monitoring Suppl (TRUE METRIX METER) w/Device KIT Use as directed 10/25/20   Rema Fendt, NP  cyclobenzaprine (FLEXERIL) 10 MG tablet Take 1/2 to 1 tablet at bedtime 01/04/23   Drema Dallas, DO  diclofenac (CATAFLAM) 50 MG tablet Take 50 mg by mouth 3 (three) times daily as needed. Patient not taking: Reported on 01/04/2023 09/01/21   [provider]  glucose blood (TRUE METRIX BLOOD GLUCOSE TEST) test strip Use as instructed 10/25/20   Rema Fendt, NP  hydrochlorothiazide (HYDRODIURIL) 12.5 MG tablet TAKE 1 TABLET BY MOUTH EVERY DAY 10/18/22   Rema Fendt, NP  metFORMIN (GLUCOPHAGE) 500 MG tablet Take 1 tablet (500 mg total) by mouth 2 (two) times daily with a meal. 12/20/22 03/20/23  Rema Fendt, NP  mupirocin ointment (BACTROBAN) 2 % Apply 1 application topically 2 (two) times daily. Patient not taking: Reported on 01/04/2023 08/25/21   Ivonne Andrew, NP  ondansetron (ZOFRAN-ODT) 8 MG disintegrating tablet Take 1 tablet (8 mg total) by mouth every 8 (eight) hours as needed for nausea or vomiting. 02/10/22   Rema Fendt, NP  oxybutynin (DITROPAN-XL) 10 MG 24 hr tablet TAKE 1 TABLET BY MOUTH EVERY DAY 12/24/22   Rema Fendt, NP  pantoprazole (PROTONIX) 40 MG tablet TAKE 1 TABLET BY MOUTH EVERY DAY 07/22/22   Unk Lightning, PA  triamcinolone ointment (KENALOG) 0.1 % Apply 1 application topically 2 (two) times daily as needed (itching). 04/27/21   Rema Fendt, NP  TRUEplus Lancets 28G MISC Use as directed 10/25/20   Rema Fendt, NP    Family History Family History  Problem Relation Age of Onset   Breast cancer Mother        limited info   Stroke Father    Heart Problems Father        heart bypass   Melanoma Father        x2-3    Cancer Sister 43       colorectal   Alzheimer's disease Paternal Aunt    Alzheimer's disease Paternal Aunt    Alzheimer's disease Paternal Aunt    Cancer Paternal Aunt        unk type   Alzheimer's disease Paternal Uncle    Heart Problems Paternal Grandmother        enlarged heart   Testicular cancer Son    Stomach cancer Neg Hx    Colon cancer  Neg Hx    Esophageal cancer Neg Hx     Social History Social History   Tobacco Use   Smoking status: Former    Packs/day: 0.20    Years: 2.00    Additional pack years: 0.00    Total pack years: 0.40    Types: Cigarettes    Quit date: 07/24/1986    Years since quitting: 36.5    Passive exposure: Past   Smokeless tobacco: Never   Tobacco comments:    social smoker  Vaping Use   Vaping Use: Never used  Substance Use Topics   Alcohol use: Yes    Alcohol/week: 0.0 standard drinks of alcohol    Comment: 1 drink on New Year's Eve   Drug use: No     Allergies   Fish allergy, Iodinated contrast media, Shellfish allergy, and Sulfamethoxazole-trimethoprim   Review of Systems Review of Systems  Constitutional:  Negative for fever.  Respiratory:  Positive for cough.   All other systems reviewed and are negative.    Physical Exam Triage Vital Signs ED Triage Vitals  Enc Vitals Group     BP 01/20/23 0812 129/62     Pulse Rate 01/20/23 0812 (!) 120     Resp 01/20/23 0812 16     Temp 01/20/23 0812 97.8 F (36.6 C)     Temp src --      SpO2 01/20/23 0812 94 %     Weight --      Height --      Head Circumference --      Peak Flow --      Pain Score 01/20/23 0813 0     Pain Loc --      Pain Edu? --      Excl. in GC? --    No data found.  Updated Vital Signs BP 129/62 (BP Location: Left Arm)   Pulse (!) 120   Temp 97.8 F (36.6 C)   Resp 16   SpO2 94%   Visual Acuity Right Eye Distance:   Left Eye Distance:   Bilateral Distance:    Right Eye Near:   Left Eye Near:    Bilateral Near:     Physical  Exam Vitals and nursing note reviewed.  Constitutional:      Appearance: She is well-developed.  HENT:     Head: Normocephalic.     Mouth/Throat:     Mouth: Mucous membranes are moist.  Eyes:     Pupils: Pupils are equal, round, and reactive to light.  Cardiovascular:     Rate and Rhythm: Normal rate.  Pulmonary:     Effort: Pulmonary effort is normal.  Abdominal:     General: There is no distension.  Musculoskeletal:        General: Normal range of motion.     Cervical back: Normal range of motion.  Skin:    General: Skin is warm.  Neurological:     General: No focal deficit present.     Mental Status: She is alert and oriented to person, place, and time.      UC Treatments / Results  Labs (all labs ordered are listed, but only abnormal results are displayed) Labs Reviewed - No data to display  EKG   Radiology DG Chest 2 View  Result Date: 01/20/2023 CLINICAL DATA:  Cough. EXAM: CHEST - 2 VIEW COMPARISON:  Chest x-ray 11/02/2018. FINDINGS: Elevated left hemidiaphragm, similar. Low lung volumes, similar. No consolidation. No definite pleural effusions or  pneumothorax. No acute osseous abnormality. IMPRESSION: Stable chest without evidence of acute cardiopulmonary disease. Electronically Signed   By: Feliberto HartsFrederick S Jones M.D.   On: 01/20/2023 08:50    Procedures Procedures (including critical care time)  Medications Ordered in UC Medications - No data to display  Initial Impression / Assessment and Plan / UC Course  I have reviewed the triage vital signs and the nursing notes.  Pertinent labs & imaging results that were available during my care of the patient were reviewed by me and considered in my medical decision making (see chart for details).    Chest xray  no acute.  Pt given rx for albuterol and doxycycline   Final Clinical Impressions(s) / UC Diagnoses   Final diagnoses:  Acute bronchitis, unspecified organism     Discharge Instructions      Return  if any problems.  See your Physician for recheck in one week    ED Prescriptions     Medication Sig Dispense Auth. Provider   doxycycline (VIBRAMYCIN) 100 MG capsule Take 1 capsule (100 mg total) by mouth 2 (two) times daily. 20 capsule Mahiya Kercheval K, [place]PA-C   albuterol (VENTOLIN HFA) 108 (90 Base) MCG/ACT inhaler Inhale 2 puffs into the lungs every 6 (six) hours as needed for wheezing or shortness of breath. 8 g Elson AreasSofia, Melissia Lahman K, [place]PA-C      PDMP not reviewed this encounter. An After Visit Summary was printed and given to the patient.     Elson AreasSofia, Precious Gilchrest K, [place]-C 01/20/23 [id number]

## 2023-01-22 ENCOUNTER — Other Ambulatory Visit: Payer: Self-pay | Admitting: Physician Assistant

## 2023-01-25 ENCOUNTER — Encounter: Payer: Self-pay | Admitting: Family

## 2023-01-25 ENCOUNTER — Other Ambulatory Visit: Payer: Self-pay

## 2023-01-27 NOTE — Progress Notes (Signed)
Patient ID: Ashley Mcconnell, female    DOB: 1961/04/28  MRN: [id number]  CC: Urgent Care Follow  Subjective: Ashley Mcconnell is a 62 y.o. female who presents for Urgent Care follow-up.  Her concerns today include:  01/20/2023 [facility]) per PA note: Initial Impression / Assessment and Plan / UC Course  I have reviewed the triage vital signs and the nursing notes.   Pertinent labs & imaging results that were available during my care of the patient were reviewed by me and considered in my medical decision making (see chart for details).   Chest xray  no acute.  Pt given rx for albuterol and doxycycline   Today's visit 01/28/2023: Cough improved but persisting since Urgent Care visit. Worse during nighttime. She denies associated red flag symptoms. She has 2 more days of Doxycycline which she is aware to continue. Reports she has been unable to pickup Albuterol inhaler due to needing a prior authorization.  Patient Active Problem List   Diagnosis Date Noted   Genetic testing 06/02/2022   Family history of breast cancer 03/29/2022   Breast pain 03/29/2022   Vitamin D deficiency 11/17/2021   Neck pain 10/01/2021   Pain in joint of right shoulder 09/30/2021   Lump in lower inner quadrant of right breast 08/25/2021   COVID 05/30/2021   BV (bacterial vaginosis) 04/28/2021   Obesity (BMI 30-39.9) 01/27/2021   Chest pain of uncertain etiology 10/31/2020   Diabetes mellitus due to underlying condition with hyperosmolarity without coma, without long-term current use of insulin 10/31/2020   Abdominal pain 10/29/2020   Accumulation of fluid in tissues 10/29/2020   Arthralgia of lower leg 10/29/2020   Blood in feces 10/29/2020   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   Can't get food down 10/29/2020   Fatigue 10/29/2020   Hemorrhoids, internal 10/29/2020   History of colon polyps 10/29/2020   Infection of the upper respiratory tract 10/29/2020    Interdigital neuralgia 10/29/2020   Pulmonary embolism    Morton's neuroma    Migraine    Hypertension    Cardiomyopathy    Type 2 diabetes mellitus 10/25/2020   Hyperlipemia 10/25/2020   Blood in urine 07/14/2020   Benign essential hypertension 06/01/2018   Family history of colon cancer requiring screening colonoscopy 02/09/2018   Family history of colonic polyps 02/09/2018   Pre-diabetes 11/10/2017   Hyperglycemia 11/09/2017   Acute bronchitis 03/15/2016   Chill 12/10/2015   FOM (frequency of micturition) 12/10/2015   Dyspnea 07/25/2015   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   GERD (gastroesophageal reflux disease) 07/25/2015   Chest wall pain 07/25/2015   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   Left flank pain 02/06/2013   Leukocytosis 02/06/2013   Nephrolithiasis 02/06/2013     Current Outpatient Medications on File Prior to Visit  Medication Sig Dispense Refill   atorvastatin (LIPITOR) 20 MG tablet Take 1 tablet (20 mg total) by mouth daily. 30 tablet 2   Blood Glucose Monitoring Suppl (TRUE METRIX METER) w/Device KIT Use as directed 1 kit 0   cyclobenzaprine (FLEXERIL) 10 MG tablet Take 1/2 to 1 tablet at bedtime 30 tablet 5   doxycycline (VIBRAMYCIN) 100 MG capsule Take 1 capsule (100 mg total) by mouth 2 (two) times daily. 20 capsule 0   glucose blood (TRUE METRIX BLOOD GLUCOSE TEST) test strip Use as instructed 100 each 12   hydrochlorothiazide (HYDRODIURIL) 12.5 MG tablet TAKE 1 TABLET BY MOUTH EVERY DAY 90 tablet 1  metFORMIN (GLUCOPHAGE) 500 MG tablet Take 1 tablet (500 mg total) by mouth 2 (two) times daily with a meal. 60 tablet 2   mupirocin ointment (BACTROBAN) 2 % Apply 1 application topically 2 (two) times daily. (Patient not taking: Reported on 01/04/2023) 22 g 0   ondansetron (ZOFRAN-ODT) 8 MG disintegrating tablet Take 1 tablet (8 mg total) by mouth every 8 (eight) hours as needed for nausea or vomiting. 30 tablet 1   oxybutynin (DITROPAN-XL) 10 MG  24 hr tablet TAKE 1 TABLET BY MOUTH EVERY DAY 90 tablet 0   pantoprazole (PROTONIX) 40 MG tablet TAKE 1 TABLET BY MOUTH EVERY DAY 90 tablet 1   triamcinolone ointment (KENALOG) 0.1 % Apply 1 application topically 2 (two) times daily as needed (itching). 80 g 1   TRUEplus Lancets 28G MISC Use as directed 100 each 4   No current facility-administered medications on file prior to visit.    Allergies  Allergen Reactions   Fish Allergy Rash    [place] Gum is only fish she does not have allergy to   Iodinated Contrast Media Other (See Comments)    Rash  Rash    Shellfish Allergy Itching and Rash   Sulfamethoxazole-Trimethoprim Itching and Swelling    Swelling tongue and itching mouth. Swelling tongue and itching mouth.     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  Tobacco Use   Smoking status: Former    Packs/day: 0.20    Years: 2.00    Additional pack years: 0.00    Total pack years: 0.40    Types: Cigarettes    Quit date: 07/24/1986    Years since quitting: 36.5    Passive exposure: Past   Smokeless tobacco: Never   Tobacco comments:    social smoker  Vaping Use   Vaping Use: Never used  Substance and Sexual Activity   Alcohol use: Yes    Alcohol/week: 0.0 standard drinks of alcohol    Comment: 1 drink on New Year's Eve   Drug use: No   Sexual activity: Not Currently    Birth control/protection: Surgical  Other Topics Concern   Not on file  Social History Narrative   Patient lives at home with her family.   Caffeine Use: 1 cup of tea two times weekly   Right handed   Apartment   Currently employed   Social Determinants of Health   Financial Resource Strain: Not on file  Food Insecurity: Not on file   Transportation Needs: Not on file  Physical Activity: Not on file  Stress: Not on file  Social Connections: Not on file  Intimate Partner Violence: Not on file    Family History  Problem Relation Age of Onset   Breast cancer Mother        limited info   Stroke Father    Heart Problems Father        heart bypass   Melanoma Father        x2-3   Cancer Sister 68       colorectal   Alzheimer's disease Paternal Aunt    Alzheimer's disease Paternal Aunt    Alzheimer's disease Paternal Aunt    Cancer  Paternal Aunt        unk type   Alzheimer's disease Paternal Uncle    Heart Problems Paternal Grandmother        enlarged heart   Testicular cancer Son    Stomach cancer Neg Hx    Colon cancer Neg Hx    Esophageal cancer Neg Hx     Past Surgical History:  Procedure Laterality Date   ABDOMINAL HYSTERECTOMY     CARDIAC SURGERY  04/10/2014   Catherization    CHOLECYSTECTOMY     COLONOSCOPY     CYSTOSCOPY/URETEROSCOPY/HOLMIUM LASER/STENT PLACEMENT Left 10/10/2018   Procedure: CYSTOSCOPY/URETEROSCOPY/HOLMIUM LASER/STENT PLACEMENT;  Surgeon: Rene Paci, MD;  Location: [facility];  Service: Urology;  Laterality: Left;   LIPOSUCTION  07/2022   LITHOTRIPSY     x5   TONSILLECTOMY  10/12/1967   UPPER GASTROINTESTINAL ENDOSCOPY      ROS: Review of Systems Negative except as stated above  PHYSICAL EXAM: BP 130/85 (BP Location: Right Arm, Patient Position: Sitting, Cuff Size: Normal)   Pulse 90   Temp 98.4 F (36.9 C)   Resp 16   Ht 5\' 5"  (1.651 m)   Wt 215 lb 9.6 oz (97.8 kg)   SpO2 97%   BMI 35.88 kg/m   Physical Exam HENT:     Head: Normocephalic and atraumatic.  Eyes:     Extraocular Movements: Extraocular movements intact.     Conjunctiva/sclera: Conjunctivae normal.     Pupils: Pupils are equal, round, and reactive to light.  Cardiovascular:     Rate and Rhythm: Normal rate and regular rhythm.     Pulses: Normal pulses.     Heart sounds: Normal  heart sounds.  Pulmonary:     Effort: Pulmonary effort is normal.     Breath sounds: Normal breath sounds.  Musculoskeletal:     Cervical back: Normal range of motion and neck supple.  Neurological:     General: No focal deficit present.     Mental Status: She is alert and oriented to person, place, and time.  Psychiatric:        Mood and Affect: Mood normal.        Behavior: Behavior normal.      ASSESSMENT AND PLAN: 1. Acute bronchitis, unspecified organism - Patient today in office with no cardiopulmonary distress.  - Continue Doxycycline as prescribed.  - Prednisone, Benzonatate capsules, and Albuterol inhaler as prescribed. Counseled on medication adherence.  - Referral to Pulmonology for further evaluation/management. During the interim follow-up with primary provider as scheduled.  - predniSONE (DELTASONE) 10 MG tablet; Take 6 tablets (60 mg total) by mouth daily with breakfast for 1 day, THEN 5 tablets (50 mg total) daily with breakfast for 1 day, THEN 4 tablets (40 mg total) daily with breakfast for 1 day, THEN 3 tablets (30 mg total) daily with breakfast for 1 day, THEN 2 tablets (20 mg total) daily with breakfast for 1 day, THEN 1 tablet (10 mg total) daily with breakfast for 1 day.  Dispense: 21 tablet; Refill: 0 - benzonatate (TESSALON) 100 MG capsule; Take 1 capsule (100 mg total) by mouth 3 (three) times daily as needed for cough.  Dispense: 60 capsule; Refill: 0 - Ambulatory referral to Pulmonology - albuterol (VENTOLIN HFA) 108 (90 Base) MCG/ACT inhaler; Inhale 2 puffs into the lungs every 6 (six) hours as needed for wheezing or shortness of breath.  Dispense: 8 g; Refill: 2   Patient was given the opportunity to ask questions.  Patient verbalized understanding of the plan and was able to repeat key elements of the plan. Patient was given clear instructions to go to Emergency Department or return to medical center if symptoms don't improve, worsen, or new problems  develop.The patient verbalized understanding.   Orders Placed This Encounter  Procedures   Ambulatory referral to Pulmonology     Requested Prescriptions   Signed Prescriptions Disp Refills   predniSONE (DELTASONE) 10 MG tablet 21 tablet 0    Sig: Take 6 tablets (60 mg total) by mouth daily with breakfast for 1 day, THEN 5 tablets (50 mg total) daily with breakfast for 1 day, THEN 4 tablets (40 mg total) daily with breakfast for 1 day, THEN 3 tablets (30 mg total) daily with breakfast for 1 day, THEN 2 tablets (20 mg total) daily with breakfast for 1 day, THEN 1 tablet (10 mg total) daily with breakfast for 1 day.   benzonatate (TESSALON) 100 MG capsule 60 capsule 0    Sig: Take 1 capsule (100 mg total) by mouth 3 (three) times daily as needed for cough.   albuterol (VENTOLIN HFA) 108 (90 Base) MCG/ACT inhaler 8 g 2    Sig: Inhale 2 puffs into the lungs every 6 (six) hours as needed for wheezing or shortness of breath.    Follow-up with primary provider as scheduled.   Rema Fendt, NP

## 2023-01-28 ENCOUNTER — Ambulatory Visit (INDEPENDENT_AMBULATORY_CARE_PROVIDER_SITE_OTHER): Payer: Managed Care, Other (non HMO) | Admitting: Family

## 2023-01-28 ENCOUNTER — Encounter: Payer: Self-pay | Admitting: Family

## 2023-01-28 VITALS — BP 130/85 | HR 90 | Temp 98.4°F | Resp 16 | Ht 65.0 in | Wt 215.6 lb

## 2023-01-28 DIAGNOSIS — J209 Acute bronchitis, unspecified: Secondary | ICD-10-CM

## 2023-01-28 MED ORDER — ALBUTEROL SULFATE HFA 108 (90 BASE) MCG/ACT IN AERS
2.0000 | INHALATION_SPRAY | Freq: Four times a day (QID) | RESPIRATORY_TRACT | 2 refills | Status: AC | PRN
Start: 2023-01-28 — End: ?

## 2023-01-28 MED ORDER — PREDNISONE 10 MG PO TABS
ORAL_TABLET | ORAL | 0 refills | Status: AC
Start: 2023-01-28 — End: 2023-02-03

## 2023-01-28 MED ORDER — BENZONATATE 100 MG PO CAPS
100.0000 mg | ORAL_CAPSULE | Freq: Three times a day (TID) | ORAL | 0 refills | Status: AC | PRN
Start: 2023-01-28 — End: ?

## 2023-01-28 NOTE — Patient Instructions (Signed)

## 2023-01-28 NOTE — Progress Notes (Signed)
Pt is here for urgent care f/u   Complaining of productive cough, worse at night

## 2023-04-04 ENCOUNTER — Encounter: Payer: Self-pay | Admitting: Family

## 2023-04-06 ENCOUNTER — Encounter: Payer: Self-pay | Admitting: Family

## 2023-04-06 ENCOUNTER — Ambulatory Visit: Payer: Managed Care, Other (non HMO) | Admitting: Family

## 2023-04-06 ENCOUNTER — Other Ambulatory Visit (HOSPITAL_COMMUNITY)
Admission: RE | Admit: 2023-04-06 | Discharge: 2023-04-06 | Disposition: A | Discharge status: Home / Self Care | Payer: Managed Care, Other (non HMO) | Source: Ambulatory Visit | Attending: Family | Admitting: Family

## 2023-04-06 VITALS — BP 136/86 | HR 100 | Temp 98.0°F | Ht 65.0 in | Wt 217.0 lb

## 2023-04-06 DIAGNOSIS — R350 Frequency of micturition: Secondary | ICD-10-CM | POA: Diagnosis present

## 2023-04-06 DIAGNOSIS — Z7984 Long term (current) use of oral hypoglycemic drugs: Secondary | ICD-10-CM

## 2023-04-06 DIAGNOSIS — N3000 Acute cystitis without hematuria: Secondary | ICD-10-CM

## 2023-04-06 DIAGNOSIS — E119 Type 2 diabetes mellitus without complications: Secondary | ICD-10-CM

## 2023-04-06 DIAGNOSIS — Z113 Encounter for screening for infections with a predominantly sexual mode of transmission: Secondary | ICD-10-CM | POA: Insufficient documentation

## 2023-04-06 LAB — POCT GLYCOSYLATED HEMOGLOBIN (HGB A1C): HbA1c, POC (controlled diabetic range): 6.8 % (ref 0.0–7.0)

## 2023-04-06 LAB — POCT URINALYSIS DIP (CLINITEK)
Bilirubin, UA: NEGATIVE
Blood, UA: NEGATIVE
Glucose, UA: NEGATIVE mg/dL
Ketones, POC UA: NEGATIVE mg/dL
Nitrite, UA: POSITIVE — AB
POC PROTEIN,UA: NEGATIVE
Spec Grav, UA: 1.025 (ref 1.010–1.025)
Urobilinogen, UA: 0.2 E.U./dL
pH, UA: 5.5 (ref 5.0–8.0)

## 2023-04-06 MED ORDER — NITROFURANTOIN MONOHYD MACRO 100 MG PO CAPS
100.0000 mg | ORAL_CAPSULE | Freq: Two times a day (BID) | ORAL | 0 refills | Status: AC
Start: 2023-04-06 — End: 2023-04-11

## 2023-04-06 NOTE — Patient Instructions (Signed)
Urinary Tract Infection, Adult  A urinary tract infection (UTI) is an infection of any part of the urinary tract. The urinary tract includes the kidneys, ureters, bladder, and urethra. These organs make, store, and get rid of urine in the body. An upper UTI affects the ureters and kidneys. A lower UTI affects the bladder and urethra. What are the causes? Most urinary tract infections are caused by bacteria in your genital area around your urethra, where urine leaves your body. These bacteria grow and cause inflammation of your urinary tract. What increases the risk? You are more likely to develop this condition if: You have a urinary catheter that stays in place. You are not able to control when you urinate or have a bowel movement (incontinence). You are female and you: Use a spermicide or diaphragm for birth control. Have low estrogen levels. Are pregnant. You have certain genes that increase your risk. You are sexually active. You take antibiotic medicines. You have a condition that causes your flow of urine to slow down, such as: An enlarged prostate, if you are female. Blockage in your urethra. A kidney stone. A nerve condition that affects your bladder control (neurogenic bladder). Not getting enough to drink, or not urinating often. You have certain medical conditions, such as: Diabetes. A weak disease-fighting system (immunesystem). Sickle cell disease. Gout. Spinal cord injury. What are the signs or symptoms? Symptoms of this condition include: Needing to urinate right away (urgency). Frequent urination. This may include small amounts of urine each time you urinate. Pain or burning with urination. Blood in the urine. Urine that smells bad or unusual. Trouble urinating. Cloudy urine. Vaginal discharge, if you are female. Pain in the abdomen or the lower back. You may also have: Vomiting or a decreased appetite. Confusion. Irritability or tiredness. A fever or  chills. Diarrhea. The first symptom in older adults may be confusion. In some cases, they may not have any symptoms until the infection has worsened. How is this diagnosed? This condition is diagnosed based on your medical history and a physical exam. You may also have other tests, including: Urine tests. Blood tests. Tests for STIs (sexually transmitted infections). If you have had more than one UTI, a cystoscopy or imaging studies may be done to determine the cause of the infections. How is this treated? Treatment for this condition includes: Antibiotic medicine. Over-the-counter medicines to treat discomfort. Drinking enough water to stay hydrated. If you have frequent infections or have other conditions such as a kidney stone, you may need to see a health care provider who specializes in the urinary tract (urologist). In rare cases, urinary tract infections can cause sepsis. Sepsis is a life-threatening condition that occurs when the body responds to an infection. Sepsis is treated in the hospital with IV antibiotics, fluids, and other medicines. Follow these instructions at home:  Medicines Take over-the-counter and prescription medicines only as told by your health care provider. If you were prescribed an antibiotic medicine, take it as told by your health care provider. Do not stop using the antibiotic even if you start to feel better. General instructions Make sure you: Empty your bladder often and completely. Do not hold urine for long periods of time. Empty your bladder after sex. Wipe from front to back after urinating or having a bowel movement if you are female. Use each tissue only one time when you wipe. Drink enough fluid to keep your urine pale yellow. Keep all follow-up visits. This is important. Contact a health   care provider if: Your symptoms do not get better after 1-2 days. Your symptoms go away and then return. Get help right away if: You have severe pain in  your back or your lower abdomen. You have a fever or chills. You have nausea or vomiting. Summary A urinary tract infection (UTI) is an infection of any part of the urinary tract, which includes the kidneys, ureters, bladder, and urethra. Most urinary tract infections are caused by bacteria in your genital area. Treatment for this condition often includes antibiotic medicines. If you were prescribed an antibiotic medicine, take it as told by your health care provider. Do not stop using the antibiotic even if you start to feel better. Keep all follow-up visits. This is important. This information is not intended to replace advice given to you by your health care provider. Make sure you discuss any questions you have with your health care provider. Document Revised: 05/04/2020 Document Reviewed: 05/09/2020 Elsevier Patient Education  2024 [employer]  

## 2023-04-06 NOTE — Progress Notes (Signed)
Patient ID: Ashley Mcconnell, female    DOB: 1961/07/23  MRN: [id number]  CC: UTI  Subjective: Ashley Mcconnell is a 62 y.o. female who presents for UTI.   Her concerns today include:  - Frequent urination for several days. Denies associated red flag symptoms.  - Reports has appointment with Endocrinology in October 2024 for diabetes management. She is adhering to Metformin regimen. Blood sugars at home varies. Would like hemoglobin A1c checked today.  Patient Active Problem List   Diagnosis Date Noted   Genetic testing 06/02/2022   Family history of breast cancer 03/29/2022   Breast pain 03/29/2022   Vitamin D deficiency 11/17/2021   Neck pain 10/01/2021   Pain in joint of right shoulder 09/30/2021   Lump in lower inner quadrant of right breast 08/25/2021   COVID 05/30/2021   BV (bacterial vaginosis) 04/28/2021   Obesity (BMI 30-39.9) 01/27/2021   Chest pain of uncertain etiology 10/31/2020   Diabetes mellitus due to underlying condition with hyperosmolarity without coma, without long-term current use of insulin (HCC) 10/31/2020   Abdominal pain 10/29/2020   Accumulation of fluid in tissues 10/29/2020   Arthralgia of lower leg 10/29/2020   Blood in feces 10/29/2020   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   Can't get food down 10/29/2020   Fatigue 10/29/2020   Hemorrhoids, internal 10/29/2020   History of colon polyps 10/29/2020   Infection of the upper respiratory tract 10/29/2020   Interdigital neuralgia 10/29/2020   Pulmonary embolism (HCC)    Morton's neuroma    Migraine    Hypertension    Cardiomyopathy (HCC)    Type 2 diabetes mellitus (HCC) 10/25/2020   Hyperlipemia 10/25/2020   Blood in urine 07/14/2020   Benign essential hypertension 06/01/2018   Family history of colon cancer requiring screening colonoscopy 02/09/2018   Family history of colonic polyps 02/09/2018   Pre-diabetes 11/10/2017   Hyperglycemia 11/09/2017   Acute bronchitis 03/15/2016   Chill  12/10/2015   FOM (frequency of micturition) 12/10/2015   Dyspnea 07/25/2015   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   GERD (gastroesophageal reflux disease) 07/25/2015   Chest wall pain 07/25/2015   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   Left flank pain 02/06/2013   Leukocytosis 02/06/2013   Nephrolithiasis 02/06/2013     Current Outpatient Medications on File Prior to Visit  Medication Sig Dispense Refill   albuterol (VENTOLIN HFA) 108 (90 Base) MCG/ACT inhaler Inhale 2 puffs into the lungs every 6 (six) hours as needed for wheezing or shortness of breath. 8 g 2   atorvastatin (LIPITOR) 20 MG tablet Take 1 tablet (20 mg total) by mouth daily. 30 tablet 2   benzonatate (TESSALON) 100 MG capsule Take 1 capsule (100 mg total) by mouth 3 (three) times daily as needed for cough. 60 capsule 0   Blood Glucose Monitoring Suppl (TRUE METRIX METER) w/Device KIT Use as directed 1 kit 0   glucose blood (TRUE METRIX BLOOD GLUCOSE TEST) test strip Use as instructed 100 each 12   hydrochlorothiazide (HYDRODIURIL) 12.5 MG tablet TAKE 1 TABLET BY MOUTH EVERY DAY 90 tablet 1   metFORMIN (GLUCOPHAGE) 500 MG tablet Take 1 tablet (500 mg total) by mouth 2 (two) times daily with a meal. 60 tablet 2   mupirocin ointment (BACTROBAN) 2 % Apply 1 application topically 2 (two) times daily. 22 g 0   ondansetron (ZOFRAN-ODT) 8 MG disintegrating tablet Take 1 tablet (8 mg total) by mouth every 8 (eight) hours as needed for nausea or vomiting. 30 tablet  1   oxybutynin (DITROPAN-XL) 10 MG 24 hr tablet TAKE 1 TABLET BY MOUTH EVERY DAY 90 tablet 0   pantoprazole (PROTONIX) 40 MG tablet TAKE 1 TABLET BY MOUTH EVERY DAY 90 tablet 1   TRUEplus Lancets 28G MISC Use as directed 100 each 4   cyclobenzaprine (FLEXERIL) 10 MG tablet Take 1/2 to 1 tablet at bedtime (Patient not taking: Reported on 04/06/2023) 30 tablet 5   doxycycline (VIBRAMYCIN) 100 MG capsule Take 1 capsule (100 mg total) by mouth 2 (two) times daily.  (Patient not taking: Reported on 04/06/2023) 20 capsule 0   triamcinolone ointment (KENALOG) 0.1 % Apply 1 application topically 2 (two) times daily as needed (itching). (Patient not taking: Reported on 04/06/2023) 80 g 1   No current facility-administered medications on file prior to visit.    Allergies  Allergen Reactions   Fish Allergy Rash    [place] Gum is only fish she does not have allergy to   Iodinated Contrast Media Other (See Comments)    Rash  Rash    Shellfish Allergy Itching and Rash   Sulfamethoxazole-Trimethoprim Itching and Swelling    Swelling tongue and itching mouth. Swelling tongue and itching mouth.     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  Tobacco Use   Smoking status: Former    Packs/day: 0.20    Years: 2.00    Additional pack years: 0.00    Total pack years: 0.40    Types: Cigarettes    Quit date: 07/24/1986    Years since quitting: 36.7    Passive exposure: Past   Smokeless tobacco: Never   Tobacco comments:    social smoker  Vaping Use   Vaping Use: Never used  Substance and Sexual Activity   Alcohol use: Yes    Alcohol/week: 0.0 standard drinks of alcohol    Comment: 1 drink on New Year's Eve   Drug use: No   Sexual activity: Not Currently    Birth control/protection: Surgical  Other Topics Concern   Not on file  Social History Narrative   Patient lives at home with her family.   Caffeine Use: 1 cup of tea two times weekly   Right handed   Apartment   Currently employed   Social Determinants of Health   Financial Resource Strain: Not on file  Food Insecurity: Not on file  Transportation Needs: Not on file  Physical Activity: Not on file  Stress: Not on file  Social  Connections: Not on file  Intimate Partner Violence: Not on file    Family History  Problem Relation Age of Onset   Breast cancer Mother        limited info   Stroke Father    Heart Problems Father        heart bypass   Melanoma Father        x2-3   Cancer Sister 55       colorectal   Alzheimer's disease Paternal Aunt    Alzheimer's disease Paternal Aunt    Alzheimer's disease Paternal Aunt    Cancer Paternal Aunt        unk type   Alzheimer's disease Paternal Uncle  Heart Problems Paternal Grandmother        enlarged heart   Testicular cancer Son    Stomach cancer Neg Hx    Colon cancer Neg Hx    Esophageal cancer Neg Hx     Past Surgical History:  Procedure Laterality Date   ABDOMINAL HYSTERECTOMY     CARDIAC SURGERY  04/10/2014   Catherization    CHOLECYSTECTOMY     COLONOSCOPY     CYSTOSCOPY/URETEROSCOPY/HOLMIUM LASER/STENT PLACEMENT Left 10/10/2018   Procedure: CYSTOSCOPY/URETEROSCOPY/HOLMIUM LASER/STENT PLACEMENT;  Surgeon: Rene Paci, MD;  Location: [facility];  Service: Urology;  Laterality: Left;   LIPOSUCTION  07/2022   LITHOTRIPSY     x5   TONSILLECTOMY  10/12/1967   UPPER GASTROINTESTINAL ENDOSCOPY      ROS: Review of Systems Negative except as stated above  PHYSICAL EXAM: BP 136/86   Pulse 100   Temp 98 F (36.7 C) (Oral)   Ht 5\' 5"  (1.651 m)   Wt 217 lb (98.4 kg)   SpO2 95%   BMI 36.11 kg/m   Physical Exam HENT:     Head: Normocephalic and atraumatic.     Nose: Nose normal.     Mouth/Throat:     Mouth: Mucous membranes are moist.     Pharynx: Oropharynx is clear.  Eyes:     Extraocular Movements: Extraocular movements intact.     Conjunctiva/sclera: Conjunctivae normal.     Pupils: Pupils are equal, round, and reactive to light.  Cardiovascular:     Rate and Rhythm: Normal rate and regular rhythm.     Pulses: Normal pulses.     Heart sounds: Normal heart sounds.  Pulmonary:     Effort: Pulmonary effort is normal.      Breath sounds: Normal breath sounds.  Musculoskeletal:     Cervical back: Normal range of motion and neck supple.  Neurological:     General: No focal deficit present.     Mental Status: She is alert and oriented to person, place, and time.  Psychiatric:        Mood and Affect: Mood normal.        Behavior: Behavior normal.     ASSESSMENT AND PLAN: 1. Acute cystitis without hematuria 2. Frequent urination - Positive urinalysis for urinary tract infection.  - Macrobid as prescribed. Counseled on medication adherence/adverse effects.  - Routine screening.  - Follow-up with primary provider as scheduled. - Cervicovaginal ancillary only - POCT URINALYSIS DIP (CLINITEK) - nitrofurantoin, macrocrystal-monohydrate, (MACROBID) 100 MG capsule; Take 1 capsule (100 mg total) by mouth 2 (two) times daily for 5 days.  Dispense: 10 capsule; Refill: 0  3. Type 2 diabetes mellitus without complication, without long-term current use of insulin (HCC) - Routine screening. - Discussed the importance of healthy eating habits, low-carbohydrate diet, low-sugar diet, regular aerobic exercise (at least 150 minutes a week as tolerated) and medication compliance to achieve or maintain control of diabetes. - Will update medication regimen and follow-up recommendations once lab results.  - POCT glycosylated hemoglobin (Hb A1C); Future   Patient was given the opportunity to ask questions.  Patient verbalized understanding of the plan and was able to repeat key elements of the plan. Patient was given clear instructions to go to Emergency Department or return to medical center if symptoms don't improve, worsen, or new problems develop.The patient verbalized understanding.   Orders Placed This Encounter  Procedures   POCT URINALYSIS DIP (CLINITEK)    Requested Prescriptions   Signed Prescriptions  Disp Refills   nitrofurantoin, macrocrystal-monohydrate, (MACROBID) 100 MG capsule 10 capsule 0    Sig: Take  1 capsule (100 mg total) by mouth 2 (two) times daily for 5 days.    Follow-up with primary provider as scheduled.  Rema Fendt, NP

## 2023-04-07 LAB — CERVICOVAGINAL ANCILLARY ONLY
Bacterial Vaginitis (gardnerella): NEGATIVE
Candida Glabrata: NEGATIVE
Candida Vaginitis: NEGATIVE
Chlamydia: NEGATIVE
Comment: NEGATIVE
Comment: NEGATIVE
Comment: NEGATIVE
Comment: NEGATIVE
Comment: NEGATIVE
Comment: NORMAL
Neisseria Gonorrhea: NEGATIVE
Trichomonas: NEGATIVE

## 2023-04-17 ENCOUNTER — Other Ambulatory Visit: Payer: Self-pay | Admitting: Family

## 2023-04-17 DIAGNOSIS — N3289 Other specified disorders of bladder: Secondary | ICD-10-CM

## 2023-04-17 DIAGNOSIS — N3281 Overactive bladder: Secondary | ICD-10-CM

## 2023-04-21 ENCOUNTER — Ambulatory Visit: Payer: Managed Care, Other (non HMO) | Admitting: Neurology

## 2023-05-31 ENCOUNTER — Other Ambulatory Visit: Payer: Self-pay | Admitting: Family

## 2023-05-31 DIAGNOSIS — E785 Hyperlipidemia, unspecified: Secondary | ICD-10-CM

## 2023-05-31 NOTE — Telephone Encounter (Signed)
Complete

## 2023-06-15 ENCOUNTER — Other Ambulatory Visit: Payer: Self-pay | Admitting: Family

## 2023-06-15 ENCOUNTER — Other Ambulatory Visit: Payer: Self-pay

## 2023-06-15 DIAGNOSIS — I10 Essential (primary) hypertension: Secondary | ICD-10-CM

## 2023-06-15 MED ORDER — HYDROCHLOROTHIAZIDE 12.5 MG PO TABS
12.5000 mg | ORAL_TABLET | Freq: Every day | ORAL | 1 refills | Status: DC
Start: 2023-06-15 — End: 2023-12-20

## 2023-06-16 NOTE — Telephone Encounter (Signed)
See routing comment(s) for message information.

## 2023-06-20 NOTE — Progress Notes (Unsigned)
Patient ID: Ashley Mcconnell, female    DOB: 1961/01/16  MRN: [id number]  CC: Chronic Conditions Follow-Up  Subjective: Ashley Mcconnell is a 62 y.o. female who presents for chronic conditions follow-up.   Her concerns today include:  - Doing well on Metformin, no issues/concerns. She denies red flag symptoms associated with diabetes. She is established with an eye doctor and plans to schedule an appointment soon.  - Established with Cardiology for chronic conditions management. - Reports anxiety depression related to work-life balance, planning to go to [place] to provide care for her mother who recently broke her arm, and unhappy with her recent surgical procedure. She denies thoughts of self-harm, suicidal ideations, homicidal ideations.  Patient Active Problem List   Diagnosis Date Noted   Genetic testing 06/02/2022   Family history of breast cancer 03/29/2022   Breast pain 03/29/2022   Vitamin D deficiency 11/17/2021   Neck pain 10/01/2021   Pain in joint of right shoulder 09/30/2021   Lump in lower inner quadrant of right breast 08/25/2021   COVID 05/30/2021   BV (bacterial vaginosis) 04/28/2021   Obesity (BMI 30-39.9) 01/27/2021   Chest pain of uncertain etiology 10/31/2020   Diabetes mellitus due to underlying condition with hyperosmolarity without coma, without long-term current use of insulin (HCC) 10/31/2020   Abdominal pain 10/29/2020   Accumulation of fluid in tissues 10/29/2020   Arthralgia of lower leg 10/29/2020   Blood in feces 10/29/2020   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   Can't get food down 10/29/2020   Fatigue 10/29/2020   Hemorrhoids, internal 10/29/2020   History of colon polyps 10/29/2020   Infection of the upper respiratory tract 10/29/2020   Interdigital neuralgia 10/29/2020   Pulmonary embolism (HCC)    Morton's neuroma    Migraine    Hypertension    Cardiomyopathy (HCC)    Type 2 diabetes mellitus (HCC) 10/25/2020   Hyperlipemia  10/25/2020   Blood in urine 07/14/2020   Benign essential hypertension 06/01/2018   Family history of colon cancer requiring screening colonoscopy 02/09/2018   Family history of colonic polyps 02/09/2018   Pre-diabetes 11/10/2017   Hyperglycemia 11/09/2017   Acute bronchitis 03/15/2016   Chill 12/10/2015   FOM (frequency of micturition) 12/10/2015   Dyspnea 07/25/2015   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   GERD (gastroesophageal reflux disease) 07/25/2015   Chest wall pain 07/25/2015   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   Left flank pain 02/06/2013   Leukocytosis 02/06/2013   Nephrolithiasis 02/06/2013     Current Outpatient Medications on File Prior to Visit  Medication Sig Dispense Refill   albuterol (VENTOLIN HFA) 108 (90 Base) MCG/ACT inhaler Inhale 2 puffs into the lungs every 6 (six) hours as needed for wheezing or shortness of breath. 8 g 2   atorvastatin (LIPITOR) 20 MG tablet TAKE 1 TABLET BY MOUTH EVERY DAY 90 tablet 0   benzonatate (TESSALON) 100 MG capsule Take 1 capsule (100 mg total) by mouth 3 (three) times daily as needed for cough. 60 capsule 0   cyclobenzaprine (FLEXERIL) 10 MG tablet Take 1/2 to 1 tablet at bedtime 30 tablet 5   hydrochlorothiazide (HYDRODIURIL) 12.5 MG tablet TAKE 1 TABLET BY MOUTH EVERY DAY 90 tablet 1   hydrochlorothiazide (HYDRODIURIL) 12.5 MG tablet Take 1 tablet (12.5 mg total) by mouth daily. 90 tablet 1   ibuprofen (ADVIL) 800 MG tablet Take 800 mg by mouth every 8 (eight) hours as needed for moderate pain.     metFORMIN (GLUCOPHAGE) 500 MG tablet  TAKE 1 TABLET BY MOUTH 2 TIMES DAILY WITH A MEAL. 180 tablet 0   metFORMIN (GLUCOPHAGE) 500 MG tablet Take 1 tablet (500 mg total) by mouth 2 (two) times daily with a meal. 60 tablet 2   ondansetron (ZOFRAN-ODT) 8 MG disintegrating tablet Take 1 tablet (8 mg total) by mouth every 8 (eight) hours as needed for nausea or vomiting. 30 tablet 1   oxybutynin (DITROPAN-XL) 10 MG 24 hr tablet  TAKE 1 TABLET BY MOUTH EVERY DAY 90 tablet 0   pantoprazole (PROTONIX) 40 MG tablet TAKE 1 TABLET BY MOUTH EVERY DAY 90 tablet 1   glucose blood (TRUE METRIX BLOOD GLUCOSE TEST) test strip Use as instructed 100 each 12   mupirocin ointment (BACTROBAN) 2 % Apply 1 application topically 2 (two) times daily. (Patient not taking: Reported on 06/22/2023) 22 g 0   triamcinolone ointment (KENALOG) 0.1 % Apply 1 application topically 2 (two) times daily as needed (itching). (Patient not taking: Reported on 06/22/2023) 80 g 1   TRUEplus Lancets 28G MISC Use as directed 100 each 4   No current facility-administered medications on file prior to visit.    Allergies  Allergen Reactions   Fish Allergy Rash    [place] Gum is only fish she does not have allergy to   Iodinated Contrast Media Other (See Comments)    Rash  Rash    Shellfish Allergy Itching and Rash   Sulfamethoxazole-Trimethoprim Itching and Swelling    Swelling tongue and itching mouth. Swelling tongue and itching mouth.     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  Tobacco Use   Smoking status: Former    Current packs/day: 0.00    Average packs/day: 0.2 packs/day for 2.0 years (0.4 ttl pk-yrs)    Types: Cigarettes    Start date: 07/24/1984    Quit date: 07/24/1986    Years since quitting: 36.9    Passive exposure: Past   Smokeless tobacco: Never   Tobacco comments:    social smoker  Vaping Use   Vaping status: Never Used  Substance and Sexual Activity   Alcohol use: Yes    Alcohol/week: 0.0 standard drinks of alcohol    Comment: 1 drink on New Year's Eve   Drug use: No   Sexual activity: Not Currently    Birth control/protection: Surgical  Other Topics Concern    Not on file  Social History Narrative   Patient lives at home with her family.   Caffeine Use: 1 cup of tea two times weekly   Right handed   Apartment   Currently employed   Social Determinants of Health   Financial Resource Strain: Not on file  Food Insecurity: Not on file  Transportation Needs: Not on file  Physical Activity: Not on file  Stress: Not on file  Social Connections: Not on file  Intimate Partner Violence: Not on file    Family History  Problem Relation Age of Onset   Breast cancer Mother        limited info   Stroke Father    Heart Problems Father        heart bypass   Melanoma Father  x2-3   Cancer Sister 20       colorectal   Alzheimer's disease Paternal Aunt    Alzheimer's disease Paternal Aunt    Alzheimer's disease Paternal Aunt    Cancer Paternal Aunt        unk type   Alzheimer's disease Paternal Uncle    Heart Problems Paternal Grandmother        enlarged heart   Testicular cancer Son    Stomach cancer Neg Hx    Colon cancer Neg Hx    Esophageal cancer Neg Hx     Past Surgical History:  Procedure Laterality Date   ABDOMINAL HYSTERECTOMY     CARDIAC SURGERY  04/10/2014   Catherization    CHOLECYSTECTOMY     COLONOSCOPY     CYSTOSCOPY/URETEROSCOPY/HOLMIUM LASER/STENT PLACEMENT Left 10/10/2018   Procedure: CYSTOSCOPY/URETEROSCOPY/HOLMIUM LASER/STENT PLACEMENT;  Surgeon: Rene Paci, MD;  Location: [facility];  Service: Urology;  Laterality: Left;   LIPOSUCTION  07/2022   LITHOTRIPSY     x5   TONSILLECTOMY  10/12/1967   UPPER GASTROINTESTINAL ENDOSCOPY      ROS: Review of Systems Negative except as stated above  PHYSICAL EXAM: BP 125/85   Pulse 81   Temp 98.2 F (36.8 C) (Oral)   Ht 5\' 5"  (1.651 m)   Wt 218 lb 6.4 oz (99.1 kg)   SpO2 94%   BMI 36.34 kg/m   Physical Exam HENT:     Head: Normocephalic and atraumatic.     Nose: Nose normal.     Mouth/Throat:     Mouth: Mucous membranes are moist.      Pharynx: Oropharynx is clear.  Eyes:     Extraocular Movements: Extraocular movements intact.     Conjunctiva/sclera: Conjunctivae normal.     Pupils: Pupils are equal, round, and reactive to light.  Cardiovascular:     Rate and Rhythm: Normal rate and regular rhythm.     Pulses: Normal pulses.     Heart sounds: Normal heart sounds.  Pulmonary:     Effort: Pulmonary effort is normal.     Breath sounds: Normal breath sounds.  Musculoskeletal:        General: Normal range of motion.     Cervical back: Normal range of motion and neck supple.  Neurological:     General: No focal deficit present.     Mental Status: She is alert and oriented to person, place, and time.  Psychiatric:        Mood and Affect: Mood normal.        Behavior: Behavior normal.    Results for orders placed or performed in visit on 06/22/23  POCT glycosylated hemoglobin (Hb A1C)  Result Value Ref Range   Hemoglobin A1C     HbA1c POC (<> result, manual entry)     HbA1c, POC (prediabetic range)     HbA1c, POC (controlled diabetic range) 7.0 0.0 - 7.0 %    ASSESSMENT AND PLAN: 1. Type 2 diabetes mellitus without complication, without long-term current use of insulin (HCC) - Hemoglobin A1c at goal at 7.0%, goal 7%.  - Continue Metformin as prescribed. No refills needed as of present. - Routine screening.  - Discussed the importance of healthy eating habits, low-carbohydrate diet, low-sugar diet, regular aerobic exercise (at least 150 minutes a week as tolerated) and medication compliance to achieve or maintain control of diabetes. - Follow-up with primary provider in 3 months or sooner if needed.  - POCT glycosylated hemoglobin (Hb A1C) -  CMP14+EGFR - Microalbumin / creatinine urine ratio  2. Encounter for diabetic foot exam [facility]) - Referral to Podiatry for evaluation/management. - Ambulatory referral to Podiatry  3. Anxiety and depression - Patient denies thoughts of self-harm, suicidal ideations,  homicidal ideations. - Patient declined pharmacological therapy. - Follow-up with primary provider as scheduled.    Patient was given the opportunity to ask questions.  Patient verbalized understanding of the plan and was able to repeat key elements of the plan. Patient was given clear instructions to go to Emergency Department or return to medical center if symptoms don't improve, worsen, or new problems develop.The patient verbalized understanding.   Orders Placed This Encounter  Procedures   CMP14+EGFR   Microalbumin / creatinine urine ratio   Ambulatory referral to Podiatry   POCT glycosylated hemoglobin (Hb A1C)     Return in about 3 months (around 09/21/2023) for Follow-Up or next available chronic conditions.  Rema Fendt, NP

## 2023-06-20 NOTE — Progress Notes (Unsigned)
NEUROLOGY FOLLOW UP OFFICE NOTE  Ashley Mcconnell [id number]  Assessment/Plan:   Chronic tension-type headache complicated by myofascial cervical pain MRI findings not concerning - mild chronic small vessel ischemic changes in setting of hypertension, hyperlipidemia and diabetes.  Partially empty sella is normal variant.  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 Tremor likely related to pain in arm and spasms in the neck Right arm/hand numbness - may be atypical presentation of carpal tunnel syndrome which was the only finding on NCV-EMG.  No evidence of C8 radiculopathy or ulnar neuropathy.  MRI does not reveal any obvious cause for a C8-T1 radiculopathy.   She will start cyclobenzaprine 5-10mg  at bedtime for neck spasms.  Consider gabapentin or nortriptyline in the future.   Plan is to follow up with ortho regarding shoulder.  Should be restarting physical therapy soon Advised to try wearing splint on right wrist to see if symptoms improve.  Subjective:  Ashley Mcconnell is a 62 year old female with HTN, HLD, DM, cardiomyopathy, and history of pulmonary emboli and kidney stones who follows up for headache.  UPDATE: She has undergone PT/OT for her right shoulder and followed up with ortho.  ***  Headaches are *** Current NSAIDS/analgesics:  none Current triptans:  none Current ergotamine:  none Current anti-emetic:  Zofran ODT 8mg  Current muscle relaxants:  Flexeril 5mg -10mg  QHS Current Antihypertensive medications:  HCTZ Current Antidepressant medications:  none Current Anticonvulsant medications:  none Current anti-CGRP:  none Current Vitamins/Herbal/Supplements:  none Current Antihistamines/Decongestants:  none Other therapy:  none  HISTORY: In 2022, she had a work-related accident in which she grabbed a baby car seat and it fell on her and lost consciousness.  Can't remember if it hit her.  She has had head pressure, neck pain and radicular pain in the right arm with  tingling in the last 2 fingers.  If she raises her right arm above the shoulder causes pain in the shoulder and tremor in hand and head.  Pressure behind the right side of head.  Right after the accident, she underwent PT for 3-4 months.  Imaging of the right shoulder showed mild tears but nothing requiring surgery.  She does plan to return to ortho.   She had a MRI of brain without contrast on 10/01/2022 which revealed mild chronic small vessel ischemic changes as well as partially empty sella and 7 mm pineal cyst.  MRI of cervical spine showed multilevel degenerative changes with shallow disc bulges at C4-5, C5-6 and C7-T1 without significant spinal or foraminal stenosis and grade 1 anterolisthesis at C6-7 and C7-T1.  NCV-EMG of upper extremities on 10/26/2022 showed evidence of mild right carpal tunnel syndrome.    Past NSAIDS/analgesics:  Excedrin Migraine, diclofenac 50mg , ketorolac Past abortive triptans:  none Past abortive ergotamine:  none Past muscle relaxants:  Robaxin Past anti-emetic:  none Past antihypertensive medications:  none Past antidepressant medications:  amitriptyline Past anticonvulsant medications:  none Past anti-CGRP:  none Past vitamins/Herbal/Supplements:  none Past antihistamines/decongestants:  Zyrtec, Benadryl, Sudafed Other past therapies:  none    PAST MEDICAL HISTORY: Past Medical History:  Diagnosis Date   Cardiomyopathy (HCC)    Diabetes (HCC)    H/O blood clots    History of colon polyps    Hypertension    Phreesia 09/26/2020   Kidney stones    Migraine    Morton's neuroma    L Foot   Pulmonary embolism (HCC)    Status post dilation of esophageal narrowing  Vitamin D deficiency     MEDICATIONS: Current Outpatient Medications on File Prior to Visit  Medication Sig Dispense Refill   albuterol (VENTOLIN HFA) 108 (90 Base) MCG/ACT inhaler Inhale 2 puffs into the lungs every 6 (six) hours as needed for wheezing or shortness of breath. 8 g 2    atorvastatin (LIPITOR) 20 MG tablet TAKE 1 TABLET BY MOUTH EVERY DAY 90 tablet 0   benzonatate (TESSALON) 100 MG capsule Take 1 capsule (100 mg total) by mouth 3 (three) times daily as needed for cough. 60 capsule 0   Blood Glucose Monitoring Suppl (TRUE METRIX METER) w/Device KIT Use as directed 1 kit 0   cyclobenzaprine (FLEXERIL) 10 MG tablet Take 1/2 to 1 tablet at bedtime (Patient not taking: Reported on 04/06/2023) 30 tablet 5   doxycycline (VIBRAMYCIN) 100 MG capsule Take 1 capsule (100 mg total) by mouth 2 (two) times daily. (Patient not taking: Reported on 04/06/2023) 20 capsule 0   glucose blood (TRUE METRIX BLOOD GLUCOSE TEST) test strip Use as instructed 100 each 12   hydrochlorothiazide (HYDRODIURIL) 12.5 MG tablet TAKE 1 TABLET BY MOUTH EVERY DAY 90 tablet 1   hydrochlorothiazide (HYDRODIURIL) 12.5 MG tablet Take 1 tablet (12.5 mg total) by mouth daily. 90 tablet 1   metFORMIN (GLUCOPHAGE) 500 MG tablet Take 1 tablet (500 mg total) by mouth 2 (two) times daily with a meal. 60 tablet 2   mupirocin ointment (BACTROBAN) 2 % Apply 1 application topically 2 (two) times daily. 22 g 0   ondansetron (ZOFRAN-ODT) 8 MG disintegrating tablet Take 1 tablet (8 mg total) by mouth every 8 (eight) hours as needed for nausea or vomiting. 30 tablet 1   oxybutynin (DITROPAN-XL) 10 MG 24 hr tablet TAKE 1 TABLET BY MOUTH EVERY DAY 90 tablet 0   pantoprazole (PROTONIX) 40 MG tablet TAKE 1 TABLET BY MOUTH EVERY DAY 90 tablet 1   triamcinolone ointment (KENALOG) 0.1 % Apply 1 application topically 2 (two) times daily as needed (itching). (Patient not taking: Reported on 04/06/2023) 80 g 1   TRUEplus Lancets 28G MISC Use as directed 100 each 4   No current facility-administered medications on file prior to visit.    ALLERGIES: Allergies  Allergen Reactions   Fish Allergy Rash    [place] Gum is only fish she does not have allergy to   Iodinated Contrast Media Other (See Comments)    Rash  Rash    Shellfish  Allergy Itching and Rash   Sulfamethoxazole-Trimethoprim Itching and Swelling    Swelling tongue and itching mouth. Swelling tongue and itching mouth.     FAMILY HISTORY: Family History  Problem Relation Age of Onset   Breast cancer Mother        limited info   Stroke Father    Heart Problems Father        heart bypass   Melanoma Father        x2-3   Cancer Sister 99       colorectal   Alzheimer's disease Paternal Aunt    Alzheimer's disease Paternal Aunt    Alzheimer's disease Paternal Aunt    Cancer Paternal Aunt        unk type   Alzheimer's disease Paternal Uncle    Heart Problems Paternal Grandmother        enlarged heart   Testicular cancer Son    Stomach cancer Neg Hx    Colon cancer Neg Hx    Esophageal cancer Neg Hx  Objective:  *** General: No acute distress.  Patient appears ***-groomed.   Head:  Normocephalic/atraumatic Eyes:  Fundi examined but not visualized Neck: supple, no paraspinal tenderness, full range of motion Heart:  Regular rate and rhythm Lungs:  Clear to auscultation bilaterally Back: No paraspinal tenderness Neurological Exam: alert and oriented.  Speech fluent and not dysarthric, language intact.  CN II-XII intact. Bulk and tone normal, muscle strength 5/5 throughout.  Sensation to light touch intact.  Deep tendon reflexes 2+ throughout, toes downgoing.  Finger to nose testing intact.  Gait normal, Romberg negative.   Shon Millet, DO  CC: ***

## 2023-06-21 ENCOUNTER — Other Ambulatory Visit: Payer: Self-pay

## 2023-06-21 ENCOUNTER — Encounter: Payer: Self-pay | Admitting: Neurology

## 2023-06-21 ENCOUNTER — Other Ambulatory Visit: Payer: Self-pay | Admitting: Family

## 2023-06-21 ENCOUNTER — Ambulatory Visit: Payer: Managed Care, Other (non HMO) | Admitting: Neurology

## 2023-06-21 VITALS — BP 134/64 | HR 98 | Ht 65.0 in | Wt 219.4 lb

## 2023-06-21 DIAGNOSIS — M542 Cervicalgia: Secondary | ICD-10-CM | POA: Diagnosis not present

## 2023-06-21 DIAGNOSIS — E119 Type 2 diabetes mellitus without complications: Secondary | ICD-10-CM

## 2023-06-21 DIAGNOSIS — G44229 Chronic tension-type headache, not intractable: Secondary | ICD-10-CM

## 2023-06-21 MED ORDER — METFORMIN HCL 500 MG PO TABS
500.0000 mg | ORAL_TABLET | Freq: Two times a day (BID) | ORAL | 2 refills | Status: AC
Start: 2023-06-21 — End: 2023-09-19

## 2023-06-21 NOTE — Patient Instructions (Signed)
Continue cyclobenzaprine 5mg  at bedtime Continue physical therapy and occupational therapy Get formal eye exam and have results/note sent to me (Fax [phone]) Follow up 6 to 7 months.

## 2023-06-22 ENCOUNTER — Other Ambulatory Visit: Payer: Self-pay | Admitting: Family

## 2023-06-22 ENCOUNTER — Ambulatory Visit: Payer: Managed Care, Other (non HMO) | Admitting: Family

## 2023-06-22 ENCOUNTER — Encounter: Payer: Self-pay | Admitting: Family

## 2023-06-22 VITALS — BP 125/85 | HR 81 | Temp 98.2°F | Ht 65.0 in | Wt 218.4 lb

## 2023-06-22 DIAGNOSIS — E119 Type 2 diabetes mellitus without complications: Secondary | ICD-10-CM

## 2023-06-22 DIAGNOSIS — F32A Depression, unspecified: Secondary | ICD-10-CM | POA: Diagnosis not present

## 2023-06-22 DIAGNOSIS — Z7984 Long term (current) use of oral hypoglycemic drugs: Secondary | ICD-10-CM

## 2023-06-22 DIAGNOSIS — F419 Anxiety disorder, unspecified: Secondary | ICD-10-CM

## 2023-06-22 LAB — POCT GLYCOSYLATED HEMOGLOBIN (HGB A1C): HbA1c, POC (controlled diabetic range): 7 % (ref 0.0–7.0)

## 2023-06-22 NOTE — Progress Notes (Signed)
ASPt states no concerns to discuss.

## 2023-06-23 LAB — MICROALBUMIN / CREATININE URINE RATIO
Creatinine, Urine: 191.5 mg/dL
Microalb/Creat Ratio: 6 mg/g{creat} (ref 0–29)
Microalbumin, Urine: 11.9 ug/mL

## 2023-06-23 LAB — CMP14+EGFR
ALT: 13 IU/L (ref 0–32)
AST: 14 IU/L (ref 0–40)
Albumin: 4.2 g/dL (ref 3.8–4.9)
Alkaline Phosphatase: 75 IU/L (ref 44–121)
BUN/Creatinine Ratio: 27 (ref 12–28)
BUN: 21 mg/dL (ref 8–27)
Bilirubin Total: 0.5 mg/dL (ref 0.0–1.2)
CO2: 27 mmol/L (ref 20–29)
Calcium: 9.6 mg/dL (ref 8.7–10.3)
Chloride: 102 mmol/L (ref 96–106)
Creatinine, Ser: 0.79 mg/dL (ref 0.57–1.00)
Globulin, Total: 2.5 g/dL (ref 1.5–4.5)
Glucose: 130 mg/dL — ABNORMAL HIGH (ref 70–99)
Potassium: 4.8 mmol/L (ref 3.5–5.2)
Sodium: 142 mmol/L (ref 134–144)
Total Protein: 6.7 g/dL (ref 6.0–8.5)
eGFR: 86 mL/min/{1.73_m2} (ref >59)

## 2023-07-19 ENCOUNTER — Other Ambulatory Visit: Payer: Self-pay | Admitting: Family

## 2023-07-19 DIAGNOSIS — N3289 Other specified disorders of bladder: Secondary | ICD-10-CM

## 2023-07-19 DIAGNOSIS — N3281 Overactive bladder: Secondary | ICD-10-CM

## 2023-07-19 NOTE — Telephone Encounter (Signed)
Complete

## 2023-07-20 ENCOUNTER — Other Ambulatory Visit: Payer: Self-pay | Admitting: Physician Assistant

## 2023-07-26 ENCOUNTER — Ambulatory Visit: Payer: Managed Care, Other (non HMO) | Admitting: Podiatry

## 2023-08-27 ENCOUNTER — Other Ambulatory Visit: Payer: Self-pay | Admitting: Family

## 2023-08-27 DIAGNOSIS — E785 Hyperlipidemia, unspecified: Secondary | ICD-10-CM

## 2023-09-21 ENCOUNTER — Ambulatory Visit: Payer: Managed Care, Other (non HMO) | Admitting: Family

## 2023-09-21 ENCOUNTER — Encounter: Payer: Self-pay | Admitting: Family

## 2023-09-21 VITALS — BP 113/70 | HR 86 | Temp 98.0°F | Ht 65.0 in | Wt 211.8 lb

## 2023-09-21 DIAGNOSIS — Z7985 Long-term (current) use of injectable non-insulin antidiabetic drugs: Secondary | ICD-10-CM

## 2023-09-21 DIAGNOSIS — E119 Type 2 diabetes mellitus without complications: Secondary | ICD-10-CM

## 2023-09-21 DIAGNOSIS — Z7984 Long term (current) use of oral hypoglycemic drugs: Secondary | ICD-10-CM

## 2023-09-21 LAB — POCT GLYCOSYLATED HEMOGLOBIN (HGB A1C): HbA1c, POC (controlled diabetic range): 6.1 % (ref 0.0–7.0)

## 2023-09-21 NOTE — Progress Notes (Signed)
Patient ID: Ashley Mcconnell, female    DOB: Mar 29, 1961  MRN: [id number]  CC: Chronic Conditions Follow-Up  Subjective: Ashley Mcconnell is a 62 y.o. female who presents for chronic conditions follow-up.   Her concerns today include:  - Established with Cardiology for chronic conditions management.  - Established with Endocrinology for diabetes management. Doing well on Metformin and Mounjaro, no issues/concerns. Home blood sugars 110's - 140's. Diabetic eye exam on 08/02/2023. Denies red flag symptoms associated with diabetes.    Patient Active Problem List   Diagnosis Date Noted   Genetic testing 06/02/2022   Family history of breast cancer 03/29/2022   Breast pain 03/29/2022   Vitamin D deficiency 11/17/2021   Neck pain 10/01/2021   Pain in joint of right shoulder 09/30/2021   Lump in lower inner quadrant of right breast 08/25/2021   COVID 05/30/2021   BV (bacterial vaginosis) 04/28/2021   Obesity (BMI 30-39.9) 01/27/2021   Chest pain of uncertain etiology 10/31/2020   Diabetes mellitus due to underlying condition with hyperosmolarity without coma, without long-term current use of insulin (HCC) 10/31/2020   Abdominal pain 10/29/2020   Accumulation of fluid in tissues 10/29/2020   Arthralgia of lower leg 10/29/2020   Blood in feces 10/29/2020   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   Can't get food down 10/29/2020   Fatigue 10/29/2020   Hemorrhoids, internal 10/29/2020   History of colon polyps 10/29/2020   Infection of the upper respiratory tract 10/29/2020   Interdigital neuralgia of foot 10/29/2020   Pulmonary embolism (HCC)    Morton's neuroma    Migraine    Hypertension    Cardiomyopathy (HCC)    Type 2 diabetes mellitus (HCC) 10/25/2020   Hyperlipemia 10/25/2020   Blood in urine 07/14/2020   Benign essential hypertension 06/01/2018   Family history of colon cancer requiring screening colonoscopy 02/09/2018   Family history of colonic polyps 02/09/2018    Pre-diabetes 11/10/2017   Hyperglycemia 11/09/2017   Acute bronchitis 03/15/2016   Chill 12/10/2015   FOM (frequency of micturition) 12/10/2015   Dyspnea 07/25/2015   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   GERD (gastroesophageal reflux disease) 07/25/2015   Chest wall pain 07/25/2015   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   Left flank pain 02/06/2013   Leukocytosis 02/06/2013   Nephrolithiasis 02/06/2013     Current Outpatient Medications on File Prior to Visit  Medication Sig Dispense Refill   albuterol (VENTOLIN HFA) 108 (90 Base) MCG/ACT inhaler Inhale 2 puffs into the lungs every 6 (six) hours as needed for wheezing or shortness of breath. 8 g 2   atorvastatin (LIPITOR) 20 MG tablet TAKE 1 TABLET BY MOUTH EVERY DAY 90 tablet 0   benzonatate (TESSALON) 100 MG capsule Take 1 capsule (100 mg total) by mouth 3 (three) times daily as needed for cough. 60 capsule 0   cyclobenzaprine (FLEXERIL) 10 MG tablet Take 1/2 to 1 tablet at bedtime 30 tablet 5   hydrochlorothiazide (HYDRODIURIL) 12.5 MG tablet TAKE 1 TABLET BY MOUTH EVERY DAY 90 tablet 1   hydrochlorothiazide (HYDRODIURIL) 12.5 MG tablet Take 1 tablet (12.5 mg total) by mouth daily. 90 tablet 1   ibuprofen (ADVIL) 800 MG tablet Take 800 mg by mouth every 8 (eight) hours as needed for moderate pain.     metFORMIN (GLUCOPHAGE) 500 MG tablet TAKE 1 TABLET BY MOUTH 2 TIMES DAILY WITH A MEAL. 180 tablet 0   mupirocin ointment (BACTROBAN) 2 % Apply 1 application topically 2 (two) times daily. 22 g 0  ondansetron (ZOFRAN-ODT) 8 MG disintegrating tablet Take 1 tablet (8 mg total) by mouth every 8 (eight) hours as needed for nausea or vomiting. 30 tablet 1   oxybutynin (DITROPAN-XL) 10 MG 24 hr tablet TAKE 1 TABLET BY MOUTH EVERY DAY 90 tablet 0   pantoprazole (PROTONIX) 40 MG tablet TAKE 1 TABLET BY MOUTH EVERY DAY 90 tablet 0   triamcinolone ointment (KENALOG) 0.1 % Apply 1 application topically 2 (two) times daily as needed  (itching). 80 g 1   glucose blood (TRUE METRIX BLOOD GLUCOSE TEST) test strip Use as instructed 100 each 12   metFORMIN (GLUCOPHAGE) 500 MG tablet Take 1 tablet (500 mg total) by mouth 2 (two) times daily with a meal. 60 tablet 2   TRUEplus Lancets 28G MISC Use as directed 100 each 4   No current facility-administered medications on file prior to visit.    Allergies  Allergen Reactions   Fish Allergy Rash    [place] Gum is only fish she does not have allergy to   Iodinated Contrast Media Other (See Comments)    Rash  Rash    Shellfish Allergy Itching and Rash   Sulfamethoxazole-Trimethoprim Itching and Swelling    Swelling tongue and itching mouth. Swelling tongue and itching mouth.     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  Tobacco Use   Smoking status: Former    Current packs/day: 0.00    Average packs/day: 0.2 packs/day for 2.0 years (0.4 ttl pk-yrs)    Types: Cigarettes    Start date: 07/24/1984    Quit date: 07/24/1986    Years since quitting: 37.1    Passive exposure: Past   Smokeless tobacco: Never   Tobacco comments:    social smoker  Vaping Use   Vaping status: Never Used  Substance and Sexual Activity   Alcohol use: Yes    Alcohol/week: 0.0 standard drinks of alcohol    Comment: 1 drink on New Year's Eve   Drug use: No   Sexual activity: Not Currently    Birth control/protection: Surgical  Other Topics Concern   Not on file  Social History Narrative   Patient lives at home with her family.   Caffeine Use: 1 cup of tea two times weekly   Right handed   Apartment   Currently employed   Social Determinants of Health   Financial Resource Strain: Not on file  Food Insecurity: Not on file   Transportation Needs: Not on file  Physical Activity: Not on file  Stress: Not on file  Social Connections: Not on file  Intimate Partner Violence: Not on file    Family History  Problem Relation Age of Onset   Breast cancer Mother        limited info   Stroke Father    Heart Problems Father        heart bypass   Melanoma Father        x2-3   Cancer Sister 50       colorectal   Alzheimer's disease Paternal Aunt    Alzheimer's disease Paternal Aunt    Alzheimer's disease Paternal Aunt    Cancer Paternal Aunt  unk type   Alzheimer's disease Paternal Uncle    Heart Problems Paternal Grandmother        enlarged heart   Testicular cancer Son    Stomach cancer Neg Hx    Colon cancer Neg Hx    Esophageal cancer Neg Hx     Past Surgical History:  Procedure Laterality Date   ABDOMINAL HYSTERECTOMY     CARDIAC SURGERY  04/10/2014   Catherization    CHOLECYSTECTOMY     COLONOSCOPY     CYSTOSCOPY/URETEROSCOPY/HOLMIUM LASER/STENT PLACEMENT Left 10/10/2018   Procedure: CYSTOSCOPY/URETEROSCOPY/HOLMIUM LASER/STENT PLACEMENT;  Surgeon: Rene Paci, MD;  Location: [facility];  Service: Urology;  Laterality: Left;   LIPOSUCTION  07/2022   LITHOTRIPSY     x5   TONSILLECTOMY  10/12/1967   UPPER GASTROINTESTINAL ENDOSCOPY      ROS: Review of Systems Negative except as stated above  PHYSICAL EXAM: BP 113/70   Pulse 86   Temp 98 F (36.7 C) (Oral)   Ht 5\' 5"  (1.651 m)   Wt 211 lb 12.8 oz (96.1 kg)   SpO2 95%   BMI 35.25 kg/m   Physical Exam HENT:     Head: Normocephalic and atraumatic.     Nose: Nose normal.     Mouth/Throat:     Mouth: Mucous membranes are moist.     Pharynx: Oropharynx is clear.  Eyes:     Extraocular Movements: Extraocular movements intact.     Conjunctiva/sclera: Conjunctivae normal.     Pupils: Pupils are equal, round, and reactive to light.  Cardiovascular:     Rate and Rhythm: Normal rate and regular rhythm.     Pulses:  Normal pulses.     Heart sounds: Normal heart sounds.  Pulmonary:     Effort: Pulmonary effort is normal.     Breath sounds: Normal breath sounds.  Musculoskeletal:        General: Normal range of motion.     Cervical back: Normal range of motion and neck supple.  Neurological:     General: No focal deficit present.     Mental Status: She is alert and oriented to person, place, and time.  Psychiatric:        Mood and Affect: Mood normal.        Behavior: Behavior normal.      ASSESSMENT AND PLAN: 1. Type 2 diabetes mellitus without complication, without long-term current use of insulin (HCC) - Hemoglobin A1c at goal at 6.1%, goal 7%.  - Continue Metformin and Mounjaro as prescribed. Counseled on medication adherence/adverse effects. No refills needed as of present.  - Discussed the importance of healthy eating habits, low-carbohydrate diet, low-sugar diet, regular aerobic exercise (at least 150 minutes a week as tolerated) and medication compliance to achieve or maintain control of diabetes. - Keep all scheduled appointments with established Endocrinology.  - POCT glycosylated hemoglobin (Hb A1C)   Patient was given the opportunity to ask questions.  Patient verbalized understanding of the plan and was able to repeat key elements of the plan. Patient was given clear instructions to go to Emergency Department or return to medical center if symptoms don't improve, worsen, or new problems develop.The patient verbalized understanding.  Follow-up with primary provider as scheduled.   Rema Fendt, NP

## 2023-09-21 NOTE — Progress Notes (Signed)
ASPatient states no other concerns to discuss.   Wants Flu vaccine.

## 2023-10-17 ENCOUNTER — Other Ambulatory Visit: Payer: Self-pay

## 2023-10-17 ENCOUNTER — Other Ambulatory Visit: Payer: Self-pay | Admitting: Family

## 2023-10-17 DIAGNOSIS — N3281 Overactive bladder: Secondary | ICD-10-CM

## 2023-10-17 DIAGNOSIS — N3289 Other specified disorders of bladder: Secondary | ICD-10-CM

## 2023-10-17 MED ORDER — OXYBUTYNIN CHLORIDE ER 10 MG PO TB24
10.0000 mg | ORAL_TABLET | Freq: Every day | ORAL | 0 refills | Status: DC
Start: 2023-10-17 — End: 2024-01-16

## 2023-10-18 ENCOUNTER — Encounter: Payer: Self-pay | Admitting: Family

## 2023-10-21 ENCOUNTER — Other Ambulatory Visit: Payer: Self-pay | Admitting: Physician Assistant

## 2023-11-25 ENCOUNTER — Other Ambulatory Visit: Payer: Self-pay | Admitting: Neurology

## 2023-11-29 ENCOUNTER — Other Ambulatory Visit: Payer: Self-pay | Admitting: Family

## 2023-11-29 DIAGNOSIS — E785 Hyperlipidemia, unspecified: Secondary | ICD-10-CM

## 2023-12-20 ENCOUNTER — Other Ambulatory Visit: Payer: Self-pay | Admitting: Family

## 2023-12-20 ENCOUNTER — Other Ambulatory Visit: Payer: Self-pay

## 2023-12-20 DIAGNOSIS — I10 Essential (primary) hypertension: Secondary | ICD-10-CM

## 2023-12-20 MED ORDER — HYDROCHLOROTHIAZIDE 12.5 MG PO TABS
12.5000 mg | ORAL_TABLET | Freq: Every day | ORAL | 1 refills | Status: DC
Start: 2023-12-20 — End: 2024-06-28

## 2023-12-26 ENCOUNTER — Ambulatory Visit: Payer: Managed Care, Other (non HMO) | Admitting: Neurology

## 2024-01-09 ENCOUNTER — Encounter: Payer: Self-pay | Admitting: Neurology

## 2024-01-15 ENCOUNTER — Other Ambulatory Visit: Payer: Self-pay | Admitting: Family

## 2024-01-15 DIAGNOSIS — N3289 Other specified disorders of bladder: Secondary | ICD-10-CM

## 2024-01-15 DIAGNOSIS — N3281 Overactive bladder: Secondary | ICD-10-CM

## 2024-01-20 NOTE — Progress Notes (Deleted)
 NEUROLOGY FOLLOW UP OFFICE NOTE  Ashley Mcconnell [id number]  Assessment/Plan:   Chronic tension-type headache complicated by myofascial cervical pain MRI findings not concerning - mild chronic small vessel ischemic changes in setting of hypertension, hyperlipidemia and diabetes.  Partially empty sella is normal variant.  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    Cyclobenzaprine 5-10mg  at bedtime for neck spasms.   Advised to have an updated dilated eye exam and have notes faxed to me. Follow up 6-7 months.   Subjective:  Ashley Mcconnell is a 63 year old female with HTN, HLD, DM, cardiomyopathy, and history of pulmonary emboli and kidney stones who follows up for headache.  UPDATE: ***   Current NSAIDS/analgesics:  none Current triptans:  none Current ergotamine:  none Current anti-emetic:  Zofran ODT 8mg  Current muscle relaxants:  Flexeril 5mg -10mg  QHS Current Antihypertensive medications:  HCTZ Current Antidepressant medications:  none Current Anticonvulsant medications:  none Current anti-CGRP:  none Current Vitamins/Herbal/Supplements:  none Current Antihistamines/Decongestants:  none Other therapy:  none  HISTORY: In 2022, she had a work-related accident in which she grabbed a baby car seat and it fell on her and lost consciousness.  Can't remember if it hit her.  She has had head pressure, neck pain and radicular pain in the right arm with tingling in the last 2 fingers.  If she raises her right arm above the shoulder causes pain in the shoulder and tremor in hand and head.  Pressure behind the right side of head.  Right after the accident, she underwent PT for 3-4 months.  Imaging of the right shoulder showed mild tears but nothing requiring surgery.  She does plan to return to ortho.   She had a MRI of brain without contrast on 10/01/2022 which revealed mild chronic small vessel ischemic changes as well as partially empty sella and 7 mm pineal cyst.  MRI of  cervical spine showed multilevel degenerative changes with shallow disc bulges at C4-5, C5-6 and C7-T1 without significant spinal or foraminal stenosis and grade 1 anterolisthesis at C6-7 and C7-T1.  NCV-EMG of upper extremities on 10/26/2022 showed evidence of mild right carpal tunnel syndrome.    Past NSAIDS/analgesics:  Excedrin Migraine, diclofenac 50mg , ketorolac Past abortive triptans:  none Past abortive ergotamine:  none Past muscle relaxants:  Robaxin Past anti-emetic:  none Past antihypertensive medications:  none Past antidepressant medications:  amitriptyline Past anticonvulsant medications:  none Past anti-CGRP:  none Past vitamins/Herbal/Supplements:  none Past antihistamines/decongestants:  Zyrtec, Benadryl, Sudafed Other past therapies:  none    PAST MEDICAL HISTORY: Past Medical History:  Diagnosis Date   Cardiomyopathy (HCC)    Diabetes (HCC)    H/O blood clots    History of colon polyps    Hypertension    Phreesia 09/26/2020   Kidney stones    Migraine    Morton's neuroma    L Foot   Pulmonary embolism (HCC)    Status post dilation of esophageal narrowing    Vitamin D deficiency     MEDICATIONS: Current Outpatient Medications on File Prior to Visit  Medication Sig Dispense Refill   albuterol (VENTOLIN HFA) 108 (90 Base) MCG/ACT inhaler Inhale 2 puffs into the lungs every 6 (six) hours as needed for wheezing or shortness of breath. 8 g 2   atorvastatin (LIPITOR) 20 MG tablet TAKE 1 TABLET BY MOUTH EVERY DAY 90 tablet 0   benzonatate (TESSALON) 100 MG capsule Take 1 capsule (100 mg total) by mouth 3 (three) times daily  as needed for cough. 60 capsule 0   cyclobenzaprine (FLEXERIL) 10 MG tablet TAKE 1/2 TO 1 TABLET BY MOUTH AT BEDTIME 30 tablet 0   glucose blood (TRUE METRIX BLOOD GLUCOSE TEST) test strip Use as instructed 100 each 12   hydrochlorothiazide (HYDRODIURIL) 12.5 MG tablet TAKE 1 TABLET BY MOUTH EVERY DAY 90 tablet 1   hydrochlorothiazide  (HYDRODIURIL) 12.5 MG tablet TAKE 1 TABLET BY MOUTH EVERY DAY 90 tablet 1   hydrochlorothiazide (HYDRODIURIL) 12.5 MG tablet Take 1 tablet (12.5 mg total) by mouth daily. 90 tablet 1   ibuprofen (ADVIL) 800 MG tablet Take 800 mg by mouth every 8 (eight) hours as needed for moderate pain.     metFORMIN (GLUCOPHAGE) 500 MG tablet TAKE 1 TABLET BY MOUTH 2 TIMES DAILY WITH A MEAL. 180 tablet 0   metFORMIN (GLUCOPHAGE) 500 MG tablet Take 1 tablet (500 mg total) by mouth 2 (two) times daily with a meal. 60 tablet 2   mupirocin ointment (BACTROBAN) 2 % Apply 1 application topically 2 (two) times daily. 22 g 0   ondansetron (ZOFRAN-ODT) 8 MG disintegrating tablet Take 1 tablet (8 mg total) by mouth every 8 (eight) hours as needed for nausea or vomiting. 30 tablet 1   oxybutynin (DITROPAN-XL) 10 MG 24 hr tablet TAKE 1 TABLET BY MOUTH EVERY DAY 90 tablet 0   oxybutynin (DITROPAN-XL) 10 MG 24 hr tablet TAKE 1 TABLET BY MOUTH EVERY DAY 90 tablet 0   pantoprazole (PROTONIX) 40 MG tablet TAKE 1 TABLET BY MOUTH EVERY DAY 90 tablet 0   triamcinolone ointment (KENALOG) 0.1 % Apply 1 application topically 2 (two) times daily as needed (itching). 80 g 1   TRUEplus Lancets 28G MISC Use as directed 100 each 4   No current facility-administered medications on file prior to visit.    ALLERGIES: Allergies  Allergen Reactions   Fish Allergy Rash    [place] Gum is only fish she does not have allergy to   Iodinated Contrast Media Other (See Comments)    Rash  Rash    Shellfish Allergy Itching and Rash   Sulfamethoxazole-Trimethoprim Itching and Swelling    Swelling tongue and itching mouth. Swelling tongue and itching mouth.     FAMILY HISTORY: Family History  Problem Relation Age of Onset   Breast cancer Mother        limited info   Stroke Father    Heart Problems Father        heart bypass   Melanoma Father        x2-3   Cancer Sister 82       colorectal   Alzheimer's disease Paternal Aunt     Alzheimer's disease Paternal Aunt    Alzheimer's disease Paternal Aunt    Cancer Paternal Aunt        unk type   Alzheimer's disease Paternal Uncle    Heart Problems Paternal Grandmother        enlarged heart   Testicular cancer Son    Stomach cancer Neg Hx    Colon cancer Neg Hx    Esophageal cancer Neg Hx       Objective:  *** General: No acute distress.  Patient appears well-groomed.  Head:  Normocephalic/atraumatic Neck:  Supple.  No paraspinal tenderness.  Full range of motion. Heart:  Regular rate and rhythm. Neuro:  Alert and oriented.  Speech fluent and not dysarthric.  Language intact.  CN II-XII intact.  Bulk and tone normal.  Muscle strength 5/5  throughout.  Deep tendon reflexes 2+ throughout.  Gait normal.  Romberg negative.  Shon Millet, DO  CC: Ricky Stabs, NP

## 2024-01-23 ENCOUNTER — Ambulatory Visit: Admitting: Neurology

## 2024-01-24 ENCOUNTER — Other Ambulatory Visit: Payer: Self-pay | Admitting: Physician Assistant

## 2024-01-31 NOTE — Progress Notes (Unsigned)
 NEUROLOGY FOLLOW UP OFFICE NOTE  Ashley Mcconnell [id number]  Assessment/Plan:   Chronic tension-type headache complicated by myofascial cervical pain MRI findings not concerning - mild chronic small vessel ischemic changes in setting of hypertension, hyperlipidemia and diabetes.  Partially empty sella is normal variant.  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    Cyclobenzaprine 5-10mg  at bedtime for neck spasms.   Advised to have an updated dilated eye exam and have notes faxed to me. Follow up 6-7 months.   Subjective:  Ashley Mcconnell is a 63 year old female with HTN, HLD, DM, cardiomyopathy, and history of pulmonary emboli and kidney stones who follows up for headache.  UPDATE: ***   Current NSAIDS/analgesics:  none Current triptans:  none Current ergotamine:  none Current anti-emetic:  Zofran ODT 8mg  Current muscle relaxants:  Flexeril 5mg -10mg  QHS Current Antihypertensive medications:  HCTZ Current Antidepressant medications:  none Current Anticonvulsant medications:  none Current anti-CGRP:  none Current Vitamins/Herbal/Supplements:  none Current Antihistamines/Decongestants:  none Other therapy:  none  HISTORY: In 2022, she had a work-related accident in which she grabbed a baby car seat and it fell on her and lost consciousness.  Can't remember if it hit her.  She has had head pressure, neck pain and radicular pain in the right arm with tingling in the last 2 fingers.  If she raises her right arm above the shoulder causes pain in the shoulder and tremor in hand and head.  Pressure behind the right side of head.  Right after the accident, she underwent PT for 3-4 months.  Imaging of the right shoulder showed mild tears but nothing requiring surgery.  She does plan to return to ortho.   She had a MRI of brain without contrast on 10/01/2022 which revealed mild chronic small vessel ischemic changes as well as partially empty sella and 7 mm pineal cyst.  MRI of  cervical spine showed multilevel degenerative changes with shallow disc bulges at C4-5, C5-6 and C7-T1 without significant spinal or foraminal stenosis and grade 1 anterolisthesis at C6-7 and C7-T1.  NCV-EMG of upper extremities on 10/26/2022 showed evidence of mild right carpal tunnel syndrome.    Past NSAIDS/analgesics:  Excedrin Migraine, diclofenac 50mg , ketorolac Past abortive triptans:  none Past abortive ergotamine:  none Past muscle relaxants:  Robaxin Past anti-emetic:  none Past antihypertensive medications:  none Past antidepressant medications:  amitriptyline Past anticonvulsant medications:  none Past anti-CGRP:  none Past vitamins/Herbal/Supplements:  none Past antihistamines/decongestants:  Zyrtec, Benadryl, Sudafed Other past therapies:  none    PAST MEDICAL HISTORY: Past Medical History:  Diagnosis Date   Cardiomyopathy (HCC)    Diabetes (HCC)    H/O blood clots    History of colon polyps    Hypertension    Phreesia 09/26/2020   Kidney stones    Migraine    Morton's neuroma    L Foot   Pulmonary embolism (HCC)    Status post dilation of esophageal narrowing    Vitamin D deficiency     MEDICATIONS: Current Outpatient Medications on File Prior to Visit  Medication Sig Dispense Refill   albuterol (VENTOLIN HFA) 108 (90 Base) MCG/ACT inhaler Inhale 2 puffs into the lungs every 6 (six) hours as needed for wheezing or shortness of breath. 8 g 2   atorvastatin (LIPITOR) 20 MG tablet TAKE 1 TABLET BY MOUTH EVERY DAY 90 tablet 0   benzonatate (TESSALON) 100 MG capsule Take 1 capsule (100 mg total) by mouth 3 (three) times daily  as needed for cough. 60 capsule 0   cyclobenzaprine (FLEXERIL) 10 MG tablet TAKE 1/2 TO 1 TABLET BY MOUTH AT BEDTIME 30 tablet 0   glucose blood (TRUE METRIX BLOOD GLUCOSE TEST) test strip Use as instructed 100 each 12   hydrochlorothiazide (HYDRODIURIL) 12.5 MG tablet TAKE 1 TABLET BY MOUTH EVERY DAY 90 tablet 1   hydrochlorothiazide  (HYDRODIURIL) 12.5 MG tablet TAKE 1 TABLET BY MOUTH EVERY DAY 90 tablet 1   hydrochlorothiazide (HYDRODIURIL) 12.5 MG tablet Take 1 tablet (12.5 mg total) by mouth daily. 90 tablet 1   ibuprofen (ADVIL) 800 MG tablet Take 800 mg by mouth every 8 (eight) hours as needed for moderate pain.     metFORMIN (GLUCOPHAGE) 500 MG tablet TAKE 1 TABLET BY MOUTH 2 TIMES DAILY WITH A MEAL. 180 tablet 0   metFORMIN (GLUCOPHAGE) 500 MG tablet Take 1 tablet (500 mg total) by mouth 2 (two) times daily with a meal. 60 tablet 2   mupirocin ointment (BACTROBAN) 2 % Apply 1 application topically 2 (two) times daily. 22 g 0   ondansetron (ZOFRAN-ODT) 8 MG disintegrating tablet Take 1 tablet (8 mg total) by mouth every 8 (eight) hours as needed for nausea or vomiting. 30 tablet 1   oxybutynin (DITROPAN-XL) 10 MG 24 hr tablet TAKE 1 TABLET BY MOUTH EVERY DAY 90 tablet 0   oxybutynin (DITROPAN-XL) 10 MG 24 hr tablet TAKE 1 TABLET BY MOUTH EVERY DAY 90 tablet 0   pantoprazole (PROTONIX) 40 MG tablet TAKE 1 TABLET BY MOUTH EVERY DAY 90 tablet 0   triamcinolone ointment (KENALOG) 0.1 % Apply 1 application topically 2 (two) times daily as needed (itching). 80 g 1   TRUEplus Lancets 28G MISC Use as directed 100 each 4   No current facility-administered medications on file prior to visit.    ALLERGIES: Allergies  Allergen Reactions   Fish Allergy Rash    [place] Gum is only fish she does not have allergy to   Iodinated Contrast Media Other (See Comments)    Rash  Rash    Shellfish Allergy Itching and Rash   Sulfamethoxazole-Trimethoprim Itching and Swelling    Swelling tongue and itching mouth. Swelling tongue and itching mouth.     FAMILY HISTORY: Family History  Problem Relation Age of Onset   Breast cancer Mother        limited info   Stroke Father    Heart Problems Father        heart bypass   Melanoma Father        x2-3   Cancer Sister 82       colorectal   Alzheimer's disease Paternal Aunt     Alzheimer's disease Paternal Aunt    Alzheimer's disease Paternal Aunt    Cancer Paternal Aunt        unk type   Alzheimer's disease Paternal Uncle    Heart Problems Paternal Grandmother        enlarged heart   Testicular cancer Son    Stomach cancer Neg Hx    Colon cancer Neg Hx    Esophageal cancer Neg Hx       Objective:  *** General: No acute distress.  Patient appears well-groomed.  Head:  Normocephalic/atraumatic Neck:  Supple.  No paraspinal tenderness.  Full range of motion. Heart:  Regular rate and rhythm. Neuro:  Alert and oriented.  Speech fluent and not dysarthric.  Language intact.  CN II-XII intact.  Bulk and tone normal.  Muscle strength 5/5  throughout.  Deep tendon reflexes 2+ throughout.  Gait normal.  Romberg negative.  Shon Millet, DO  CC: Ricky Stabs, NP

## 2024-02-01 ENCOUNTER — Ambulatory Visit (INDEPENDENT_AMBULATORY_CARE_PROVIDER_SITE_OTHER): Admitting: Neurology

## 2024-02-01 ENCOUNTER — Encounter: Payer: Self-pay | Admitting: Neurology

## 2024-02-01 VITALS — BP 104/61 | HR 100 | Ht 64.0 in | Wt 204.4 lb

## 2024-02-01 DIAGNOSIS — E348 Other specified endocrine disorders: Secondary | ICD-10-CM

## 2024-02-01 DIAGNOSIS — G44229 Chronic tension-type headache, not intractable: Secondary | ICD-10-CM | POA: Diagnosis not present

## 2024-02-01 DIAGNOSIS — M542 Cervicalgia: Secondary | ICD-10-CM

## 2024-02-01 MED ORDER — CYCLOBENZAPRINE HCL 10 MG PO TABS: ORAL_TABLET | ORAL | 5 refills | Status: AC

## 2024-02-01 NOTE — Patient Instructions (Signed)
 Take Flexeril  1/2 tablet in morning and 1/2 to 1 tablet at bedtime.  May take 1/2 tablet during day if needed.   If physical therapy ineffective, consider trigger point injections MRI of brain with and without contrast to follow up on pineal gland cyst Follow up 6 months.

## 2024-02-24 ENCOUNTER — Other Ambulatory Visit: Payer: Self-pay | Admitting: Family

## 2024-02-24 DIAGNOSIS — E785 Hyperlipidemia, unspecified: Secondary | ICD-10-CM

## 2024-02-24 NOTE — Telephone Encounter (Signed)
 Complete

## 2024-04-22 ENCOUNTER — Other Ambulatory Visit: Payer: Self-pay | Admitting: Family

## 2024-04-22 DIAGNOSIS — N3289 Other specified disorders of bladder: Secondary | ICD-10-CM

## 2024-04-22 DIAGNOSIS — N3281 Overactive bladder: Secondary | ICD-10-CM

## 2024-04-23 NOTE — Telephone Encounter (Signed)
 Complete. Schedule appointment. Last office visit 09/21/2023.

## 2024-05-11 ENCOUNTER — Telehealth: Admitting: Physician Assistant

## 2024-05-11 DIAGNOSIS — R3989 Other symptoms and signs involving the genitourinary system: Secondary | ICD-10-CM

## 2024-05-11 MED ORDER — CEPHALEXIN 500 MG PO CAPS
500.0000 mg | ORAL_CAPSULE | Freq: Two times a day (BID) | ORAL | 0 refills | Status: AC
Start: 2024-05-11 — End: ?

## 2024-05-11 NOTE — Progress Notes (Signed)

## 2024-05-26 ENCOUNTER — Other Ambulatory Visit: Payer: Self-pay | Admitting: Family

## 2024-05-26 DIAGNOSIS — E785 Hyperlipidemia, unspecified: Secondary | ICD-10-CM

## 2024-06-08 ENCOUNTER — Ambulatory Visit: Admitting: Neurology

## 2024-06-13 ENCOUNTER — Ambulatory Visit: Admitting: Neurology

## 2024-06-27 ENCOUNTER — Other Ambulatory Visit: Payer: Self-pay | Admitting: Family

## 2024-06-27 DIAGNOSIS — Z1231 Encounter for screening mammogram for malignant neoplasm of breast: Secondary | ICD-10-CM

## 2024-06-28 ENCOUNTER — Other Ambulatory Visit: Payer: Self-pay | Admitting: Family

## 2024-06-28 DIAGNOSIS — I10 Essential (primary) hypertension: Secondary | ICD-10-CM

## 2024-06-28 NOTE — Telephone Encounter (Signed)
 Complete

## 2024-06-29 ENCOUNTER — Ambulatory Visit
Admission: RE | Admit: 2024-06-29 | Discharge: 2024-06-29 | Disposition: A | Discharge status: Home / Self Care | Source: Ambulatory Visit

## 2024-06-29 ENCOUNTER — Telehealth: Payer: Self-pay | Admitting: Family

## 2024-06-29 DIAGNOSIS — Z1231 Encounter for screening mammogram for malignant neoplasm of breast: Secondary | ICD-10-CM

## 2024-06-29 NOTE — Telephone Encounter (Signed)
 I faxed back on 06/29/2024

## 2024-06-29 NOTE — Telephone Encounter (Signed)
 A document form from the [facility] has been faxed: STAT, to be filled out by provider. Send document back via Fax within 7-days. Document is located in providers tray at front office.          Fax number: [phone]

## 2024-07-02 ENCOUNTER — Other Ambulatory Visit: Payer: Self-pay | Admitting: Family

## 2024-07-02 DIAGNOSIS — N63 Unspecified lump in unspecified breast: Secondary | ICD-10-CM

## 2024-07-05 ENCOUNTER — Ambulatory Visit
Admission: RE | Admit: 2024-07-05 | Discharge: 2024-07-05 | Disposition: A | Discharge status: Home / Self Care | Source: Ambulatory Visit | Attending: Family | Admitting: Family

## 2024-07-05 DIAGNOSIS — N63 Unspecified lump in unspecified breast: Secondary | ICD-10-CM

## 2024-07-06 ENCOUNTER — Ambulatory Visit: Payer: Self-pay | Admitting: Family

## 2024-07-25 ENCOUNTER — Other Ambulatory Visit: Payer: Self-pay | Admitting: Family

## 2024-07-25 DIAGNOSIS — N3281 Overactive bladder: Secondary | ICD-10-CM

## 2024-07-25 DIAGNOSIS — N3289 Other specified disorders of bladder: Secondary | ICD-10-CM

## 2024-07-25 NOTE — Telephone Encounter (Signed)
 Schedule appointment. Last office visit 09/21/2023. During the interim report to the Emergency Department/Urgent Care/call 911 for immediate medical evaluation.

## 2024-07-26 NOTE — Telephone Encounter (Signed)
 Pt scheduled on next available appt with pcp

## 2024-07-31 ENCOUNTER — Telehealth: Payer: Self-pay | Admitting: Neurology

## 2024-07-31 DIAGNOSIS — E348 Other specified endocrine disorders: Secondary | ICD-10-CM

## 2024-07-31 NOTE — Telephone Encounter (Signed)
 Pt called in this morning and she stated that no one has called her to schedule her for the MRI for the Brain. Thanks

## 2024-07-31 NOTE — Telephone Encounter (Signed)
 Per patient last note 02/01/24, MRI of brain with and without contrast to follow up on pineal gland cyst.   MRI ordered today. Patient advised.

## 2024-08-07 ENCOUNTER — Ambulatory Visit: Admitting: Neurology

## 2024-08-28 ENCOUNTER — Ambulatory Visit (INDEPENDENT_AMBULATORY_CARE_PROVIDER_SITE_OTHER): Admitting: Family

## 2024-08-28 ENCOUNTER — Encounter: Payer: Self-pay | Admitting: Family

## 2024-08-28 VITALS — BP 116/80 | HR 104 | Temp 98.8°F | Resp 16 | Ht 64.0 in | Wt 181.8 lb

## 2024-08-28 DIAGNOSIS — N3289 Other specified disorders of bladder: Secondary | ICD-10-CM | POA: Diagnosis not present

## 2024-08-28 DIAGNOSIS — N3281 Overactive bladder: Secondary | ICD-10-CM

## 2024-08-28 MED ORDER — OXYBUTYNIN CHLORIDE ER 10 MG PO TB24: 10.0000 mg | ORAL_TABLET | Freq: Every day | ORAL | 0 refills | Status: AC

## 2024-08-28 NOTE — Progress Notes (Signed)
 Patient ID: Ashley Mcconnell, female    DOB: [birth year]  MRN: [id number]  CC: Chronic Conditions Follow-Up  Subjective: Ashley Mcconnell is a 63 y.o. female who presents for chronic conditions follow-up.   Her concerns today include:  Doing well on Oxybutynin , no issues/concerns.  Patient Active Problem List   Diagnosis Date Noted   Genetic testing 06/02/2022   Family history of breast cancer 03/29/2022   Breast pain 03/29/2022   Vitamin D  deficiency 11/17/2021   Neck pain 10/01/2021   Pain in joint of right shoulder 09/30/2021   Lump in lower inner quadrant of right breast 08/25/2021   COVID 05/30/2021   BV (bacterial vaginosis) 04/28/2021   Obesity (BMI 30-39.9) 01/27/2021   Chest pain of uncertain etiology 10/31/2020   Diabetes mellitus due to underlying condition with hyperosmolarity without coma, without long-term current use of insulin (HCC) 10/31/2020   Abdominal pain 10/29/2020   Accumulation of fluid in tissues 10/29/2020   Arthralgia of lower leg 10/29/2020   Blood in feces 10/29/2020   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   Can't get food down 10/29/2020   Fatigue 10/29/2020   Hemorrhoids, internal 10/29/2020   History of colon polyps 10/29/2020   Infection of the upper respiratory tract 10/29/2020   Interdigital neuralgia of foot 10/29/2020   Pulmonary embolism (HCC)    Morton's neuroma    Migraine    Hypertension    Cardiomyopathy (HCC)    Type 2 diabetes mellitus (HCC) 10/25/2020   Hyperlipemia 10/25/2020   Blood in urine 07/14/2020   Benign essential hypertension 06/01/2018   Family history of colon cancer requiring screening colonoscopy 02/09/2018   Family history of colonic polyps 02/09/2018   Pre-diabetes 11/10/2017   Hyperglycemia 11/09/2017   Acute bronchitis 03/15/2016   Chill 12/10/2015   FOM (frequency of micturition) 12/10/2015   Dyspnea 07/25/2015   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 8 g 2   atorvastatin  (LIPITOR) 20 MG tablet TAKE 1 TABLET BY MOUTH EVERY DAY 90 tablet 0   cyclobenzaprine  (FLEXERIL ) 10 MG tablet Take 1/2 tablet in morning and 1/2 to 1 tablet at bedtime.  May take 1/2 tablet during day if needed. 60 tablet 5   hydrochlorothiazide  (HYDRODIURIL ) 12.5 MG tablet TAKE 1 TABLET BY MOUTH EVERY DAY 90 tablet 1   hydrochlorothiazide  (HYDRODIURIL ) 12.5 MG tablet TAKE 1 TABLET BY MOUTH EVERY DAY 90 tablet 1   mupirocin  ointment (BACTROBAN ) 2 % Apply 1 application topically 2 (two) times daily. (Patient taking differently: Apply 1 application  topically 2 (two) times daily. As needed) 22 g 0   ondansetron  (ZOFRAN -ODT) 8 MG disintegrating tablet Take 1 tablet (8 mg total) by mouth every 8 (eight) hours as needed for nausea or vomiting. 30 tablet 1   oxybutynin  (DITROPAN -XL) 10 MG 24 hr tablet TAKE 1 TABLET BY MOUTH EVERY DAY 90 tablet 0   pantoprazole  (PROTONIX ) 40 MG tablet TAKE 1 TABLET BY MOUTH EVERY DAY 90 tablet 0   triamcinolone  ointment (KENALOG ) 0.1 % Apply 1 application topically 2 (two) times daily as needed (  itching). 80 g 1   benzonatate  (TESSALON ) 100 MG capsule Take 1 capsule (100 mg total) by mouth 3 (three) times daily as needed for cough. 60 capsule 0   cephALEXin  (KEFLEX ) 500 MG capsule Take 1 capsule (500 mg total) by mouth 2 (two) times daily. 14 capsule 0   hydrochlorothiazide  (HYDRODIURIL ) 12.5 MG tablet TAKE 1 TABLET BY MOUTH EVERY DAY 90 tablet 0   ibuprofen  (ADVIL ) 800 MG tablet Take 800 mg by mouth every 8 (eight) hours as needed for moderate  pain.     metFORMIN  (GLUCOPHAGE ) 500 MG tablet Take 1 tablet (500 mg total) by mouth 2 (two) times daily with a meal. 60 tablet 2   MOUNJARO 5 MG/0.5ML Pen Inject 5 mg into the skin once a week.     No current facility-administered medications on file prior to visit.    Allergies  Allergen Reactions   Fish Allergy Rash    [name] is only fish she does not have allergy to   Iodinated Contrast Media Other (See Comments)    Rash  Rash    Shellfish Allergy Itching and Rash   Sulfamethoxazole -Trimethoprim  Itching and Swelling    Swelling tongue and itching mouth. Swelling tongue and itching mouth.     Social History   Socioeconomic History   Marital status: Single    Spouse name: Not on file   Number of children: 3   Years of education: College   Highest education level: Not on file  Occupational History    Employer: OTHER    Comment: [employer].    Occupation: electronics engineer  Tobacco Use   Smoking status: Former    Current packs/day: 0.00    Average packs/day: 0.2 packs/day for 2.0 years (0.4 ttl pk-yrs)    Types: Cigarettes    Start date: 07/24/1984    Quit date: 07/24/1986    Years since quitting: 38.1    Passive exposure: Past   Smokeless tobacco: Never   Tobacco comments:    social smoker  Vaping Use   Vaping status: Never Used  Substance and Sexual Activity   Alcohol use: Yes    Alcohol/week: 0.0 standard drinks of alcohol    Comment: 1 drink on New Year's Eve   Drug use: No   Sexual activity: Not Currently    Birth control/protection: Surgical  Other Topics Concern   Not on file  Social History Narrative   Patient lives at home with her family.   Caffeine Use: 1 cup of tea two times weekly   Right handed   Apartment   Currently employed   Social Drivers of Health   Financial Resource Strain: Low Risk  (08/27/2024)   Overall Financial Resource Strain (CARDIA)    Difficulty of Paying Living Expenses: Not hard at all  Food  Insecurity: No Food Insecurity (08/27/2024)   Hunger Vital Sign    Worried About Running Out of Food in the Last Year: Never true    Ran Out of Food in the Last Year: Never true  Transportation Needs: No Transportation Needs (08/27/2024)   PRAPARE - Administrator, Civil Service (Medical): No    Lack of Transportation (Non-Medical): No  Physical Activity: Insufficiently Active (08/27/2024)   Exercise Vital Sign    Days of Exercise per Week: 2 days    Minutes of Exercise per Session: 30 min  Stress: No Stress Concern Present (08/27/2024)   [employer] of Occupational Health - Occupational Stress Questionnaire  Feeling of Stress: Only a little  Social Connections: Unknown (08/28/2024)   Social Connection and Isolation Panel    Frequency of Communication with Friends and Family: More than three times a week    Frequency of Social Gatherings with Friends and Family: Once a week    Attends Religious Services: Patient declined    Active Member of Clubs or Organizations: Patient declined    Attends Banker Meetings: Patient declined    Marital Status: Patient declined  Intimate Partner Violence: Not At Risk (08/28/2024)   Humiliation, Afraid, Rape, and Kick questionnaire    Fear of Current or Ex-Partner: No    Emotionally Abused: No    Physically Abused: No    Sexually Abused: No    Family History  Problem Relation Age of Onset   Breast cancer Mother        limited info   Stroke Father    Heart Problems Father        heart bypass   Melanoma Father        x2-3   Cancer Sister 73       colorectal   Alzheimer's disease Paternal Aunt    Alzheimer's disease Paternal Aunt    Alzheimer's disease Paternal Aunt    Cancer Paternal Aunt        unk type   Alzheimer's disease Paternal Uncle    Heart Problems Paternal Grandmother        enlarged heart   Testicular cancer Son    Stomach cancer Neg Hx    Colon cancer Neg Hx    Esophageal cancer Neg Hx      Past Surgical History:  Procedure Laterality Date   ABDOMINAL HYSTERECTOMY     CARDIAC SURGERY  04/10/2014   Catherization    CHOLECYSTECTOMY     COLONOSCOPY     CYSTOSCOPY/URETEROSCOPY/HOLMIUM LASER/STENT PLACEMENT Left 10/10/2018   Procedure: CYSTOSCOPY/URETEROSCOPY/HOLMIUM LASER/STENT PLACEMENT;  Surgeon: Devere Lonni Righter, MD;  Location: [facility];  Service: Urology;  Laterality: Left;   LIPOSUCTION  07/2022   LITHOTRIPSY     x5   TONSILLECTOMY  10/12/1967   UPPER GASTROINTESTINAL ENDOSCOPY      ROS: Review of Systems Negative except as stated above  PHYSICAL EXAM: BP 116/80   Pulse (!) 104   Temp 98.8 F (37.1 C) (Oral)   Resp 16   Ht 5' 4 (1.626 m)   Wt 181 lb 12.8 oz (82.5 kg)   SpO2 96%   BMI 31.21 kg/m   Physical Exam HENT:     Head: Normocephalic and atraumatic.     Nose: Nose normal.     Mouth/Throat:     Mouth: Mucous membranes are moist.     Pharynx: Oropharynx is clear.  Eyes:     Extraocular Movements: Extraocular movements intact.     Conjunctiva/sclera: Conjunctivae normal.     Pupils: Pupils are equal, round, and reactive to light.  Cardiovascular:     Rate and Rhythm: Tachycardia present.     Pulses: Normal pulses.     Heart sounds: Normal heart sounds.  Pulmonary:     Effort: Pulmonary effort is normal.     Breath sounds: Normal breath sounds.  Musculoskeletal:        General: Normal range of motion.     Cervical back: Normal range of motion and neck supple.  Neurological:     General: No focal deficit present.     Mental Status: She is alert and oriented to person,  place, and time.  Psychiatric:        Mood and Affect: Mood normal.        Behavior: Behavior normal.     ASSESSMENT AND PLAN: 1. Overactive bladder (Primary) 2. Bladder spasms - Continue Oxybutynin  as prescribed. Counseled on medication adherence/adverse effects.  - Follow-up with primary provider in 3 months or sooner if needed. - oxybutynin  (DITROPAN -XL)  10 MG 24 hr tablet; Take 1 tablet (10 mg total) by mouth daily.  Dispense: 90 tablet; Refill: 0   Patient was given the opportunity to ask questions.  Patient verbalized understanding of the plan and was able to repeat key elements of the plan. Patient was given clear instructions to go to Emergency Department or return to medical center if symptoms don't improve, worsen, or new problems develop.The patient verbalized understanding.   Requested Prescriptions   Signed Prescriptions Disp Refills   oxybutynin  (DITROPAN -XL) 10 MG 24 hr tablet 90 tablet 0    Sig: Take 1 tablet (10 mg total) by mouth daily.    Return in about 3 months (around 11/28/2024) for Follow-Up or next available chronic conditions.  Greig JINNY Chute, NP

## 2024-08-28 NOTE — Progress Notes (Signed)
 Medication refill

## 2024-08-29 ENCOUNTER — Ambulatory Visit
Admission: RE | Admit: 2024-08-29 | Discharge: 2024-08-29 | Disposition: A | Discharge status: Home / Self Care | Source: Ambulatory Visit | Attending: Neurology | Admitting: Neurology

## 2024-08-29 DIAGNOSIS — E348 Other specified endocrine disorders: Secondary | ICD-10-CM

## 2024-08-29 MED ORDER — GADOPICLENOL 0.5 MMOL/ML IV SOLN
8.0000 mL | Freq: Once | INTRAVENOUS | Status: AC | PRN
  Administered 2024-08-29: 8 mL via INTRAVENOUS

## 2024-09-04 ENCOUNTER — Ambulatory Visit: Payer: Self-pay | Admitting: Neurology

## 2024-09-04 NOTE — Progress Notes (Signed)
 Patient advised.

## 2024-09-04 NOTE — Progress Notes (Signed)
 LMOVM for patient to call the office.

## 2024-09-22 ENCOUNTER — Other Ambulatory Visit: Payer: Self-pay | Admitting: Family

## 2024-09-22 DIAGNOSIS — I10 Essential (primary) hypertension: Secondary | ICD-10-CM

## 2024-09-25 ENCOUNTER — Encounter: Payer: Self-pay | Admitting: Family

## 2024-09-25 NOTE — Telephone Encounter (Signed)
 Requested medication (s) are due for refill today: Yes  Requested medication (s) are on the active medication list: Yes  Last refill:  06/28/24  Future visit scheduled: No  Notes to clinic:  Unable to refill per protocol due to failed labs, no updated results.      Requested Prescriptions  Pending Prescriptions Disp Refills   hydrochlorothiazide  (HYDRODIURIL ) 12.5 MG tablet [Pharmacy Med Name: HYDROCHLOROTHIAZIDE  12.5 MG TB] 90 tablet 0    Sig: TAKE 1 TABLET BY MOUTH EVERY DAY     Cardiovascular: Diuretics - Thiazide Failed - 09/25/2024 11:39 AM      Failed - Cr in normal range and within 180 days    Creatinine, Ser  Date Value Ref Range Status  06/22/2023 0.79 0.57 - 1.00 mg/dL Final         Failed - K in normal range and within 180 days    Potassium  Date Value Ref Range Status  06/22/2023 4.8 3.5 - 5.2 mmol/L Final         Failed - Na in normal range and within 180 days    Sodium  Date Value Ref Range Status  06/22/2023 142 134 - 144 mmol/L Final         Passed - Last BP in normal range    BP Readings from Last 1 Encounters:  08/28/24 116/80         Passed - Valid encounter within last 6 months    Recent Outpatient Visits           4 weeks ago Overactive bladder   [facility], Amy J, NP   1 year ago Type 2 diabetes mellitus without complication, without long-term current use of insulin (HCC)   [facility], Amy J, NP   1 year ago Type 2 diabetes mellitus without complication, without long-term current use of insulin (HCC)   [facility], [place], NP   1 year ago Acute cystitis without hematuria    [facility], Amy J, NP   1 year ago Acute bronchitis, unspecified organism   [facility], Greig PARAS, NP

## 2024-09-25 NOTE — Telephone Encounter (Signed)
 Complete

## 2024-10-01 LAB — OPHTHALMOLOGY REPORT-SCANNED

## 2024-10-03 NOTE — Progress Notes (Deleted)
 "  NEUROLOGY FOLLOW UP OFFICE NOTE  Ashley Mcconnell [id number]  Assessment/Plan:   Chronic tension-type headache complicated by myofascial cervical pain Benign pineal cyst, stable    Take Flexeril  10mg  tablet 1/2 tablet in morning and 1/2 to 1 tablet at bedtime.  May take 1/2 tablet during day if needed.   If physical therapy ineffective, consider trigger point injections MRI of brain with and without contrast to follow up on pineal gland cyst Follow up 6 months.   Subjective:  Ashley Mcconnell is a 63 year old female with HTN, HLD, DM with retinopathy, cardiomyopathy, and history of pulmonary emboli and kidney stones who follows up for headache.  MRI personally reviewed.  UPDATE: ***  MRI of brain with and without contrast on 09/04/2024 showed overall unchanged 0.8 cm pineal cyst without nodular or mass-like enhancement.   Current NSAIDS/analgesics:  Aleve Current triptans:  none Current ergotamine:  none Current anti-emetic:  Zofran  ODT 8mg  Current muscle relaxants:  Flexeril  5mg -10mg  QHS Current Antihypertensive medications:  HCTZ Current Antidepressant medications:  none Current Anticonvulsant medications:  none Current anti-CGRP:  none Current Vitamins/Herbal/Supplements:  none Current Antihistamines/Decongestants:  none Other therapy:  PT/dry needling  HISTORY: In 2022, she had a work-related accident in which she grabbed a baby car seat and it fell on her and lost consciousness.  Can't remember if it hit her.  She has had head pressure, neck pain and radicular pain in the right arm with tingling in the last 2 fingers.  If she raises her right arm above the shoulder causes pain in the shoulder and tremor in hand and head.  Pressure behind the right side of head.  Right after the accident, she underwent PT for 3-4 months.  Imaging of the right shoulder showed mild tears but nothing requiring surgery.  She does plan to return to ortho.   She had a MRI of brain without  contrast on 10/01/2022 which revealed mild chronic small vessel ischemic changes as well as partially empty sella and 7 mm pineal cyst.  MRI of cervical spine showed multilevel degenerative changes with shallow disc bulges at C4-5, C5-6 and C7-T1 without significant spinal or foraminal stenosis and grade 1 anterolisthesis at C6-7 and C7-T1.  NCV-EMG of upper extremities on 10/26/2022 showed evidence of mild right carpal tunnel syndrome.    Past NSAIDS/analgesics:  Excedrin Migraine, diclofenac  50mg , ketorolac  Past abortive triptans:  none Past abortive ergotamine:  none Past muscle relaxants:  Robaxin  Past anti-emetic:  none Past antihypertensive medications:  none Past antidepressant medications:  amitriptyline  Past anticonvulsant medications:  gabapentin (not tolerate) Past anti-CGRP:  none Past vitamins/Herbal/Supplements:  none Past antihistamines/decongestants:  Zyrtec, Benadryl , Sudafed Other past therapies:  trigger point injections     PAST MEDICAL HISTORY: Past Medical History:  Diagnosis Date   Cardiomyopathy (HCC)    Diabetes (HCC)    H/O blood clots    History of colon polyps    Hypertension    [employer] 09/26/2020   Kidney stones    Migraine    Morton's neuroma    L Foot   Pulmonary embolism (HCC)    Status post dilation of esophageal narrowing    Vitamin D  deficiency     MEDICATIONS: Current Outpatient Medications on File Prior to Visit  Medication Sig Dispense Refill   albuterol  (VENTOLIN  HFA) 108 (90 Base) MCG/ACT inhaler Inhale 2 puffs into the lungs every 6 (six) hours as needed for wheezing or shortness of breath. 8 g 2   atorvastatin  (LIPITOR) 20  MG tablet TAKE 1 TABLET BY MOUTH EVERY DAY 90 tablet 0   benzonatate  (TESSALON ) 100 MG capsule Take 1 capsule (100 mg total) by mouth 3 (three) times daily as needed for cough. 60 capsule 0   cephALEXin  (KEFLEX ) 500 MG capsule Take 1 capsule (500 mg total) by mouth 2 (two) times daily. 14 capsule 0    cyclobenzaprine  (FLEXERIL ) 10 MG tablet Take 1/2 tablet in morning and 1/2 to 1 tablet at bedtime.  May take 1/2 tablet during day if needed. 60 tablet 5   hydrochlorothiazide  (HYDRODIURIL ) 12.5 MG tablet TAKE 1 TABLET BY MOUTH EVERY DAY 90 tablet 1   hydrochlorothiazide  (HYDRODIURIL ) 12.5 MG tablet TAKE 1 TABLET BY MOUTH EVERY DAY 90 tablet 1   hydrochlorothiazide  (HYDRODIURIL ) 12.5 MG tablet TAKE 1 TABLET BY MOUTH EVERY DAY 90 tablet 0   ibuprofen  (ADVIL ) 800 MG tablet Take 800 mg by mouth every 8 (eight) hours as needed for moderate pain.     metFORMIN  (GLUCOPHAGE ) 500 MG tablet Take 1 tablet (500 mg total) by mouth 2 (two) times daily with a meal. 60 tablet 2   MOUNJARO 5 MG/0.5ML Pen Inject 5 mg into the skin once a week.     mupirocin  ointment (BACTROBAN ) 2 % Apply 1 application topically 2 (two) times daily. (Patient taking differently: Apply 1 application  topically 2 (two) times daily. As needed) 22 g 0   ondansetron  (ZOFRAN -ODT) 8 MG disintegrating tablet Take 1 tablet (8 mg total) by mouth every 8 (eight) hours as needed for nausea or vomiting. 30 tablet 1   oxybutynin  (DITROPAN -XL) 10 MG 24 hr tablet TAKE 1 TABLET BY MOUTH EVERY DAY 90 tablet 0   oxybutynin  (DITROPAN -XL) 10 MG 24 hr tablet Take 1 tablet (10 mg total) by mouth daily. 90 tablet 0   pantoprazole  (PROTONIX ) 40 MG tablet TAKE 1 TABLET BY MOUTH EVERY DAY 90 tablet 0   triamcinolone  ointment (KENALOG ) 0.1 % Apply 1 application topically 2 (two) times daily as needed (itching). 80 g 1   No current facility-administered medications on file prior to visit.    ALLERGIES: Allergies  Allergen Reactions   Fish Allergy Rash    Ashley Mcconnell is only fish she does not have allergy to   Iodinated Contrast Media Other (See Comments)    Rash  Rash    Shellfish Allergy Itching and Rash   Sulfamethoxazole -Trimethoprim  Itching and Swelling    Swelling tongue and itching mouth. Swelling tongue and itching mouth.     FAMILY  HISTORY: Family History  Problem Relation Age of Onset   Breast cancer Mother        limited info   Stroke Father    Heart Problems Father        heart bypass   Melanoma Father        x2-3   Cancer Sister 98       colorectal   Alzheimer's disease Paternal Aunt    Alzheimer's disease Paternal Aunt    Alzheimer's disease Paternal Aunt    Cancer Paternal Aunt        unk type   Alzheimer's disease Paternal Uncle    Heart Problems Paternal Grandmother        enlarged heart   Testicular cancer Son    Stomach cancer Neg Hx    Colon cancer Neg Hx    Esophageal cancer Neg Hx       Objective:  *** General: No acute distress.  Patient appears well-groomed.  Head:  Normocephalic/atraumatic Neck:  Supple.  No paraspinal tenderness.  Full range of motion. Heart:  Regular rate and rhythm. Neuro:  Alert and oriented.  Speech fluent and not dysarthric.  Language intact.  CN II-XII intact.  Bulk and tone normal.  Muscle strength 5/5 throughout.  Sensation to light touch intact.  Deep tendon reflexes 2+ throughout, toes downgoing.  Gait normal.  Romberg negative.   Juliene Dunnings, DO  CC: Greig Chute, NP       "

## 2024-10-05 ENCOUNTER — Ambulatory Visit: Admitting: Neurology

## 2024-10-16 ENCOUNTER — Ambulatory Visit: Payer: Self-pay | Admitting: Family

## 2024-11-02 ENCOUNTER — Other Ambulatory Visit: Payer: Self-pay | Admitting: Family

## 2024-11-02 DIAGNOSIS — E785 Hyperlipidemia, unspecified: Secondary | ICD-10-CM

## 2024-11-02 NOTE — Telephone Encounter (Signed)
 Complete

## 2024-11-14 ENCOUNTER — Ambulatory Visit: Admitting: Neurology

## 2024-12-03 ENCOUNTER — Ambulatory Visit: Admitting: Neurology
